# Patient Record
Sex: Female | Born: 1935 | Race: White | Hispanic: No | Marital: Married | Smoking: Current every day smoker
Health system: Southern US, Community
[De-identification: ages and names within clinical notes are randomized; demographics above are authoritative.]

## PROBLEM LIST (undated history)

## (undated) DIAGNOSIS — R195 Other fecal abnormalities: Secondary | ICD-10-CM

## (undated) DIAGNOSIS — K5790 Diverticulosis of intestine, part unspecified, without perforation or abscess without bleeding: Secondary | ICD-10-CM

## (undated) DIAGNOSIS — I509 Heart failure, unspecified: Secondary | ICD-10-CM

## (undated) DIAGNOSIS — E559 Vitamin D deficiency, unspecified: Secondary | ICD-10-CM

## (undated) DIAGNOSIS — K449 Diaphragmatic hernia without obstruction or gangrene: Secondary | ICD-10-CM

## (undated) DIAGNOSIS — H269 Unspecified cataract: Secondary | ICD-10-CM

## (undated) DIAGNOSIS — I340 Nonrheumatic mitral (valve) insufficiency: Secondary | ICD-10-CM

## (undated) DIAGNOSIS — R140 Abdominal distension (gaseous): Secondary | ICD-10-CM

## (undated) DIAGNOSIS — D649 Anemia, unspecified: Secondary | ICD-10-CM

## (undated) DIAGNOSIS — N281 Cyst of kidney, acquired: Secondary | ICD-10-CM

## (undated) DIAGNOSIS — E049 Nontoxic goiter, unspecified: Secondary | ICD-10-CM

## (undated) DIAGNOSIS — E119 Type 2 diabetes mellitus without complications: Secondary | ICD-10-CM

## (undated) DIAGNOSIS — M810 Age-related osteoporosis without current pathological fracture: Secondary | ICD-10-CM

## (undated) DIAGNOSIS — K625 Hemorrhage of anus and rectum: Secondary | ICD-10-CM

## (undated) DIAGNOSIS — E785 Hyperlipidemia, unspecified: Secondary | ICD-10-CM

## (undated) DIAGNOSIS — R197 Diarrhea, unspecified: Secondary | ICD-10-CM

## (undated) DIAGNOSIS — J449 Chronic obstructive pulmonary disease, unspecified: Secondary | ICD-10-CM

## (undated) DIAGNOSIS — M1990 Unspecified osteoarthritis, unspecified site: Secondary | ICD-10-CM

## (undated) DIAGNOSIS — N816 Rectocele: Secondary | ICD-10-CM

## (undated) DIAGNOSIS — D499 Neoplasm of unspecified behavior of unspecified site: Secondary | ICD-10-CM

## (undated) HISTORY — DX: Unspecified cataract: H26.9

## (undated) HISTORY — DX: Diverticulosis of intestine, part unspecified, without perforation or abscess without bleeding: K57.90

## (undated) HISTORY — DX: Cyst of kidney, acquired: N28.1

## (undated) HISTORY — DX: Rectocele: N81.6

## (undated) HISTORY — DX: Chronic obstructive pulmonary disease, unspecified: J44.9

## (undated) HISTORY — PX: COLON SURGERY: SHX602

## (undated) HISTORY — DX: Hyperlipidemia, unspecified: E78.5

## (undated) HISTORY — DX: Type 2 diabetes mellitus without complications: E11.9

## (undated) HISTORY — DX: Anemia, unspecified: D64.9

## (undated) HISTORY — DX: Age-related osteoporosis without current pathological fracture: M81.0

## (undated) HISTORY — DX: Nontoxic goiter, unspecified: E04.9

## (undated) HISTORY — DX: Nonrheumatic mitral (valve) insufficiency: I34.0

## (undated) HISTORY — PX: HEMORRHOID SURGERY: SHX153

## (undated) HISTORY — PX: NASAL SEPTUM SURGERY: SHX37

## (undated) HISTORY — DX: Heart failure, unspecified: I50.9

## (undated) HISTORY — DX: Other fecal abnormalities: R19.5

## (undated) HISTORY — PX: ABDOMINAL HYSTERECTOMY: SHX81

## (undated) HISTORY — DX: Abdominal distension (gaseous): R14.0

## (undated) HISTORY — DX: Vitamin D deficiency, unspecified: E55.9

## (undated) HISTORY — DX: Neoplasm of unspecified behavior of unspecified site: D49.9

## (undated) HISTORY — DX: Unspecified osteoarthritis, unspecified site: M19.90

## (undated) HISTORY — DX: Diaphragmatic hernia without obstruction or gangrene: K44.9

## (undated) HISTORY — DX: Hemorrhage of anus and rectum: K62.5

## (undated) HISTORY — PX: EYE SURGERY: SHX253

## (undated) HISTORY — DX: Diarrhea, unspecified: R19.7

---

## 1983-03-20 HISTORY — PX: SQUAMOUS CELL CARCINOMA EXCISION: SHX2433

## 1998-04-04 ENCOUNTER — Other Ambulatory Visit: Admission: RE | Admit: 1998-04-04 | Discharge: 1998-04-04 | Payer: Self-pay | Admitting: Otolaryngology

## 1999-11-24 ENCOUNTER — Ambulatory Visit (HOSPITAL_COMMUNITY): Admission: RE | Admit: 1999-11-24 | Discharge: 1999-11-24 | Payer: Self-pay | Admitting: Family Medicine

## 1999-11-24 ENCOUNTER — Encounter: Payer: Self-pay | Admitting: Family Medicine

## 2000-03-15 ENCOUNTER — Ambulatory Visit (HOSPITAL_COMMUNITY): Admission: RE | Admit: 2000-03-15 | Discharge: 2000-03-15 | Payer: Self-pay | Admitting: Surgery

## 2004-04-07 ENCOUNTER — Inpatient Hospital Stay (HOSPITAL_COMMUNITY): Admission: RE | Admit: 2004-04-07 | Discharge: 2004-04-12 | Payer: Self-pay | Admitting: Gastroenterology

## 2004-04-07 ENCOUNTER — Ambulatory Visit: Payer: Self-pay | Admitting: Gastroenterology

## 2004-04-07 ENCOUNTER — Ambulatory Visit: Payer: Self-pay | Admitting: Cardiology

## 2004-04-07 ENCOUNTER — Encounter: Payer: Self-pay | Admitting: Cardiology

## 2004-04-07 ENCOUNTER — Ambulatory Visit: Payer: Self-pay | Admitting: Internal Medicine

## 2004-04-27 ENCOUNTER — Ambulatory Visit: Payer: Self-pay | Admitting: Gastroenterology

## 2004-05-04 ENCOUNTER — Ambulatory Visit: Payer: Self-pay | Admitting: Gastroenterology

## 2004-05-10 ENCOUNTER — Ambulatory Visit: Payer: Self-pay | Admitting: Cardiology

## 2004-07-18 ENCOUNTER — Encounter (INDEPENDENT_AMBULATORY_CARE_PROVIDER_SITE_OTHER): Payer: Self-pay | Admitting: Specialist

## 2004-07-18 ENCOUNTER — Ambulatory Visit (HOSPITAL_COMMUNITY): Admission: RE | Admit: 2004-07-18 | Discharge: 2004-07-18 | Payer: Self-pay | Admitting: Surgery

## 2004-08-02 ENCOUNTER — Ambulatory Visit: Payer: Self-pay | Admitting: Cardiology

## 2004-08-28 ENCOUNTER — Ambulatory Visit: Payer: Self-pay

## 2004-12-01 ENCOUNTER — Ambulatory Visit (HOSPITAL_COMMUNITY): Admission: RE | Admit: 2004-12-01 | Discharge: 2004-12-01 | Payer: Self-pay | Admitting: Family Medicine

## 2005-02-14 ENCOUNTER — Ambulatory Visit: Payer: Self-pay | Admitting: Cardiology

## 2005-07-18 ENCOUNTER — Ambulatory Visit: Payer: Self-pay

## 2005-07-18 ENCOUNTER — Encounter: Payer: Self-pay | Admitting: Internal Medicine

## 2005-07-18 ENCOUNTER — Ambulatory Visit: Payer: Self-pay | Admitting: Cardiology

## 2006-02-25 ENCOUNTER — Ambulatory Visit: Payer: Self-pay | Admitting: Cardiology

## 2007-02-26 ENCOUNTER — Ambulatory Visit: Payer: Self-pay

## 2007-02-26 ENCOUNTER — Encounter: Payer: Self-pay | Admitting: Cardiology

## 2007-02-26 ENCOUNTER — Ambulatory Visit: Payer: Self-pay | Admitting: Cardiology

## 2008-07-27 ENCOUNTER — Ambulatory Visit (HOSPITAL_COMMUNITY): Admission: RE | Admit: 2008-07-27 | Discharge: 2008-07-27 | Payer: Self-pay | Admitting: Surgery

## 2008-08-03 ENCOUNTER — Encounter: Payer: Self-pay | Admitting: Cardiology

## 2008-11-11 ENCOUNTER — Encounter: Payer: Self-pay | Admitting: Cardiology

## 2008-12-23 DIAGNOSIS — C211 Malignant neoplasm of anal canal: Secondary | ICD-10-CM | POA: Insufficient documentation

## 2008-12-23 DIAGNOSIS — J449 Chronic obstructive pulmonary disease, unspecified: Secondary | ICD-10-CM | POA: Insufficient documentation

## 2008-12-23 DIAGNOSIS — N281 Cyst of kidney, acquired: Secondary | ICD-10-CM | POA: Insufficient documentation

## 2008-12-23 DIAGNOSIS — K5730 Diverticulosis of large intestine without perforation or abscess without bleeding: Secondary | ICD-10-CM | POA: Insufficient documentation

## 2008-12-23 DIAGNOSIS — I509 Heart failure, unspecified: Secondary | ICD-10-CM | POA: Insufficient documentation

## 2008-12-23 DIAGNOSIS — I428 Other cardiomyopathies: Secondary | ICD-10-CM | POA: Insufficient documentation

## 2008-12-23 DIAGNOSIS — E785 Hyperlipidemia, unspecified: Secondary | ICD-10-CM | POA: Insufficient documentation

## 2008-12-23 DIAGNOSIS — D509 Iron deficiency anemia, unspecified: Secondary | ICD-10-CM | POA: Insufficient documentation

## 2008-12-23 DIAGNOSIS — K449 Diaphragmatic hernia without obstruction or gangrene: Secondary | ICD-10-CM | POA: Insufficient documentation

## 2008-12-24 ENCOUNTER — Ambulatory Visit: Payer: Self-pay | Admitting: Gastroenterology

## 2008-12-24 DIAGNOSIS — K921 Melena: Secondary | ICD-10-CM | POA: Insufficient documentation

## 2008-12-24 DIAGNOSIS — K625 Hemorrhage of anus and rectum: Secondary | ICD-10-CM | POA: Insufficient documentation

## 2008-12-24 DIAGNOSIS — R141 Gas pain: Secondary | ICD-10-CM | POA: Insufficient documentation

## 2008-12-24 DIAGNOSIS — R197 Diarrhea, unspecified: Secondary | ICD-10-CM | POA: Insufficient documentation

## 2008-12-24 DIAGNOSIS — K649 Unspecified hemorrhoids: Secondary | ICD-10-CM | POA: Insufficient documentation

## 2008-12-24 DIAGNOSIS — R142 Eructation: Secondary | ICD-10-CM

## 2008-12-24 DIAGNOSIS — M129 Arthropathy, unspecified: Secondary | ICD-10-CM | POA: Insufficient documentation

## 2008-12-24 DIAGNOSIS — D049 Carcinoma in situ of skin, unspecified: Secondary | ICD-10-CM | POA: Insufficient documentation

## 2008-12-24 DIAGNOSIS — R143 Flatulence: Secondary | ICD-10-CM

## 2008-12-24 DIAGNOSIS — E039 Hypothyroidism, unspecified: Secondary | ICD-10-CM | POA: Insufficient documentation

## 2008-12-27 LAB — CONVERTED CEMR LAB
Basophils Absolute: 0.1 10*3/uL (ref 0.0–0.1)
Basophils Relative: 0.7 % (ref 0.0–3.0)
Eosinophils Absolute: 0.3 10*3/uL (ref 0.0–0.7)
Ferritin: 4 ng/mL — ABNORMAL LOW (ref 10.0–291.0)
Folate: 4.3 ng/mL
Hemoglobin: 10 g/dL — ABNORMAL LOW (ref 12.0–15.0)
Iron: 16 ug/dL — ABNORMAL LOW (ref 42–145)
Lymphs Abs: 2.5 10*3/uL (ref 0.7–4.0)
MCHC: 31.3 g/dL (ref 30.0–36.0)
MCV: 65.5 fL — ABNORMAL LOW (ref 78.0–100.0)
Neutrophils Relative %: 63.5 % (ref 43.0–77.0)
Platelets: 232 10*3/uL (ref 150.0–400.0)
RDW: 19.9 % — ABNORMAL HIGH (ref 11.5–14.6)
Transferrin: 305.1 mg/dL (ref 212.0–360.0)
Vitamin B-12: 176 pg/mL — ABNORMAL LOW (ref 211–911)

## 2008-12-30 ENCOUNTER — Encounter: Payer: Self-pay | Admitting: Gastroenterology

## 2008-12-30 ENCOUNTER — Ambulatory Visit: Payer: Self-pay | Admitting: Internal Medicine

## 2009-02-24 ENCOUNTER — Encounter: Payer: Self-pay | Admitting: Cardiology

## 2009-05-17 ENCOUNTER — Encounter: Payer: Self-pay | Admitting: Gastroenterology

## 2010-07-17 ENCOUNTER — Telehealth: Payer: Self-pay | Admitting: Gastroenterology

## 2010-07-17 NOTE — Telephone Encounter (Signed)
Appt given for pt to see Mike Gip PA 07/18/10@9am . Carlon to notify pt of appt date and time.

## 2010-07-18 ENCOUNTER — Other Ambulatory Visit (INDEPENDENT_AMBULATORY_CARE_PROVIDER_SITE_OTHER): Payer: Medicare Other

## 2010-07-18 ENCOUNTER — Ambulatory Visit (INDEPENDENT_AMBULATORY_CARE_PROVIDER_SITE_OTHER): Payer: Medicare Other | Admitting: Physician Assistant

## 2010-07-18 ENCOUNTER — Encounter: Payer: Self-pay | Admitting: Physician Assistant

## 2010-07-18 VITALS — BP 106/58 | HR 74 | Ht 67.5 in | Wt 170.0 lb

## 2010-07-18 DIAGNOSIS — K921 Melena: Secondary | ICD-10-CM

## 2010-07-18 DIAGNOSIS — D509 Iron deficiency anemia, unspecified: Secondary | ICD-10-CM

## 2010-07-18 DIAGNOSIS — Z85048 Personal history of other malignant neoplasm of rectum, rectosigmoid junction, and anus: Secondary | ICD-10-CM

## 2010-07-18 LAB — CBC WITH DIFFERENTIAL/PLATELET
Basophils Relative: 0.5 % (ref 0.0–3.0)
Lymphocytes Relative: 27.7 % (ref 12.0–46.0)
Lymphs Abs: 2.6 10*3/uL (ref 0.7–4.0)
MCHC: 32.7 g/dL (ref 30.0–36.0)
MCV: 78.8 fl (ref 78.0–100.0)
Monocytes Absolute: 0.6 10*3/uL (ref 0.1–1.0)
Monocytes Relative: 6.2 % (ref 3.0–12.0)
Platelets: 198 10*3/uL (ref 150.0–400.0)
RBC: 4.76 Mil/uL (ref 3.87–5.11)
WBC: 9.5 10*3/uL (ref 4.5–10.5)

## 2010-07-18 LAB — IRON AND TIBC
%SAT: 9 % — ABNORMAL LOW (ref 20–55)
Iron: 36 ug/dL — ABNORMAL LOW (ref 42–145)
TIBC: 401 ug/dL (ref 250–470)

## 2010-07-18 MED ORDER — PEG-KCL-NACL-NASULF-NA ASC-C 100 G PO SOLR: 1.0000 | Freq: Once | ORAL | Status: AC

## 2010-07-18 NOTE — Progress Notes (Signed)
Agree with assessment and plans 

## 2010-07-18 NOTE — Patient Instructions (Addendum)
We have scheduled the colonoscopy/Endoscopy with Dr.Patterson. Directions and brochure provided. We sent the colonoscopy prep to your pharmacy, [facility]. Stay off the aspirin. .Colonoscopy A colonoscopy is an exam to evaluate your entire colon. In this exam, your colon is cleansed. A long fiberoptic tube is inserted through your rectum and into your colon. The fiberoptic scope (endoscope) is a long bundle of enclosed and very flexible fibers. These fibers transmit light to the area examined and send images from that area to your caregiver. Discomfort is usually minimal. You may be given a drug to help you sleep (sedative) during or prior to the procedure. This exam helps to detect lumps (tumors), polyps, inflammation, and areas of bleeding. Your caregiver may also take a small piece of tissue (biopsy) that will be examined under a microscope. BEFORE THE PROCEDURE  A clear liquid diet may be required for 2 days before the exam.   Liquid injections (enemas) or laxatives may be required.   A large amount of electrolyte solution may be given to you to drink over a short period of time. This solution is used to clean out your colon.   Check in at the admissions desk to fill out necessary forms if not preregistered. There will be consent forms to sign prior to the procedure. If accompanied by friends or family, there is a waiting area for them while you are having your procedure.  LET YOUR CAREGIVER KNOW ABOUT:  Allergies to food or medicine.  Medicines taken, including vitamins, herbs, eyedrops, over-the-counter medicines, and creams.   Use of steroids (by mouth or creams).   Previous problems with anesthetics or numbing medicines.   History of bleeding problems or blood clots.  Previous surgery.   Other health problems, including diabetes and kidney problems.   Possibility of pregnancy, if this applies.   AFTER THE PROCEDURE  If you received a sedative and/or pain medicine, you  will need to arrange for someone to drive you home.   Occasionally, there is a little blood passed with the first bowel movement. DO NOT be concerned.  HOME CARE INSTRUCTIONS  It is not unusual to pass moderate amounts of gas and experience mild abdominal cramping following the procedure. This is due to air being used to inflate your colon during the exam. Walking or a warm pack on your belly (abdomen) may help.   You may resume all normal meals and activities after sedatives and medicines have worn off.   Only take over-the-counter or prescription medicines for pain, discomfort, or fever as directed by your caregiver. DO NOT use aspirin or blood thinners if a biopsy was taken. Consult your caregiver for medicine usage if biopsies were taken.  FINDING OUT THE RESULTS OF YOUR TEST Not all test results are available during your visit. If your test results are not back during the visit, make an appointment with your caregiver to find out the results. Do not assume everything is normal if you have not heard from your caregiver or the medical facility. It is important for you to follow up on all of your test results. SEEK IMMEDIATE MEDICAL CARE IF:    You pass large blood clots or fill a toilet with blood following the procedure. This may also occur 10 to 14 days following the procedure. This is more likely if a biopsy was taken.   You develop abdominal pain that keeps getting worse and cannot be relieved with medicine.  Document Released: 03/02/2000 Document Re-Released: 05/30/2009 ExitCare Patient  Information 2011 [place], [place].

## 2010-07-18 NOTE — Progress Notes (Signed)
Subjective:    [name], female    DOB: Nov 22, 1931, 77 y.o.   MRN: [id number]  HPI Katie Wilkins is a pleasant 77 year old white female known to Dr. Jarold Motto who comes in today accompanied by her daughter. She is referred by Dr. Vernon Prey for evaluation of a recurrent microcytic anemia. She had recent physical with her gynecologist Dr. Elana Alm and was also noted to be Hemoccult positive on rectal exam. Patient herself has not noted any melena or hematochezia. 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 Her last colonoscopy was done in June of 2010 per Dr. Ezzard Standing and was remarkable for diverticulosis and 3 lipomatous lesions in the right colon which apparently had been previously biopsied and were benign. She has not had endoscopy in several years. She Did Have a Capsule Endoscopy Done in 2010 As Well Which Was Unremarkable. The patient states that about 4 months ago she was told that her hemoglobin was normal and with recent labs on 07/13/2010 WBC was 6 hemoglobin 7.9 hematocrit 23.8 MCV of 80 platelets 1:15 electrolytes within normal limits albumin 4.2 hepatic panel normal.  Patient currently has no GI complaints, specifically no heartburn indigestion abdominal pain cramping nausea or change in her bowel habits. Her appetite has been good and her weight has been stable. She is not using any NSAIDs, she had been on a baby aspirin one daily prophylactically and this has been stopped. She is somewhat frustrated by the recurrent nature of her anemia, and lack of findings in the past. She does admit to feeling fatigued over the past month. She does have COPD and denies any ongoing problems with dyspnea.    Review of Systems  Constitutional: Positive for fatigue.  HENT: Negative.   Respiratory: Negative.   Cardiovascular: Negative.   Gastrointestinal: Negative.     Genitourinary: Negative.   Musculoskeletal: Negative.   Skin: Negative.   Neurological: Negative.   Hematological: Negative.   Psychiatric/Behavioral: Negative.        Objective:   Physical Exam Well-developed elderly white female in no acute distress, pleasant, alert and oriented x3, somewhat chronically ill-appearing  HEENT; ; normocephalic EOMI PERRLA ,sclera anicteric, neck; supple no JVD  Cardiovascular; regular rate and rhythm with S1-S2 occasional ectopic  Pulmonary; scattered rhonchi and wheeze bilaterally  Abdomen; soft nontender nondistended bowel sounds active no palpable mass or hepatosplenomegaly, Recta;l not repeated recently Hemoccult-positive  Extremities; no clubbing cyanosis or edema skin warm and dry   Psych; mood and affect normal and appropriate.        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 Intestinal AVMs have been suspected in the past but not documented. Now with recurrent microcytic anemia, again Hemoccult positive.  Plan; Repeat CBC today and if hemoglobin drifts below 7.5 she will need blood transfusions Check iron studies today and treat as indicated Discussed repeat endoscopic evaluation with the patient and her daughter, and they are agreeable to proceed with upper endoscopy and colonoscopy with Dr. Jarold Motto. She will be scheduled with propofol, given history of COPD, and poor response to conscious sedation in the past.  #2 Diverticulosis  #3 Cardiomyopathy and history of congestive heart failure, she does not require diuretics.  #4 COPD and continued  tobacco abuse  #5 Right colon lipomas identified at prior colonoscopy per Dr. Ezzard Standing 2010.

## 2010-07-19 ENCOUNTER — Telehealth: Payer: Self-pay | Admitting: *Deleted

## 2010-07-19 ENCOUNTER — Telehealth: Payer: Self-pay | Admitting: Physician Assistant

## 2010-07-19 DIAGNOSIS — D500 Iron deficiency anemia secondary to blood loss (chronic): Secondary | ICD-10-CM

## 2010-07-19 NOTE — Telephone Encounter (Signed)
Spoke to pt's daughter this Am about pt having repeat CBC done at [facility] today when she had her bone density test done.  Then Amy sent me a note saying the pt's is iron deficient and needs to take Integra iron supplement for 3 months.  I am to give her samples.  I left a message on the pt's daughters cell ( I think her name is Bjorn Loser) to please call me.

## 2010-07-19 NOTE — Telephone Encounter (Signed)
Message copied by Lowry Ram on Wed Jul 19, 2010  2:29 PM ------      Message from: [place], [place]      Created: Wed Jul 19, 2010  9:05 AM       PLEASE CALL PT. SHE IS IRON DEFICIENT -SHE SHOULD START INTEGRA PLUS ONE DAILY X 3 MONTHS THEN HAVE REPEAT IRON STUDIES. WE CAN GIVE HER SAMPLES. ALOS HER CBC DOES NOT MAKE SENSE. LABS FROM 4/26 FROM [facility] SHOWED A HGB OF 7.9!   ONE OF THESE SETS OF LABS IS WRONG! WE NEED TO REPEAT A CBC TODAY.

## 2010-07-19 NOTE — Telephone Encounter (Signed)
Spoke to daughter, Bjorn Loser and advised her there is a discrepancy with the HGB.  In their office on 4/26 it was 7.9. In our office yesterday it was 12.5.  Amy wants them repeated.  The daughter asked if she could have them done at Dr. Kathi Der office because she is there now for a bone density test.  Amy said that was fine. I faxed them the order today for the CBC w/diff. I spoke to [place] at that office and asked they fax the results back to [facility] asap  because the pt may need a blood transfusion.

## 2010-07-20 NOTE — Telephone Encounter (Signed)
Left another message on [phone] for the pt and her daughter, Bjorn Loser.  Advised again that I do have samples of Integra Plus Iron supplement at the front desk for the patient.  I put a month and 1/2 of samples and will do a prescription for the rest.  I asked them, to call me to let me know they got my message.

## 2010-07-21 ENCOUNTER — Telehealth: Payer: Self-pay | Admitting: *Deleted

## 2010-07-21 NOTE — Telephone Encounter (Signed)
Originally spoke pt to inform her of CBC results.  Anemia better.HGB is 9.4.  Pt said since she is not anemic she wants to cancel the colonoscopy and was adamit about it.  I tried to make a follow up appointment with Dr Jarold Motto in 6 weeks per Amy.  The pt told Me to make it with her daughter.  Bjorn Loser called me and I did make it on 08-22-2010 @ 10:30 AM.  Bjorn Loser is also coming on Tues the 8th to pick up the iron supplement samples for her mother.

## 2010-07-31 ENCOUNTER — Encounter: Payer: Self-pay | Admitting: Physician Assistant

## 2010-08-01 NOTE — Op Note (Signed)
[name], Katie Wilkins              ACCOUNT NO.:  [id number]   MEDICAL RECORD NO.:  [id number]          PATIENT TYPE:  AMB   LOCATION:  ENDO                         FACILITY:  [facility]   PHYSICIAN:  Sandria Bales. Ezzard Standing, M.D.  DATE OF BIRTH:  03-27-1931   DATE OF PROCEDURE:  07/27/2008  DATE OF DISCHARGE:                               OPERATIVE REPORT   Date of Surgery - 27 Jul 2008   PREOPERATIVE DIAGNOSIS:  History of guaiac positive stools, history of  Bowen's disease of anus.   POSTOPERATIVE DIAGNOSIS:  Three submucosal lipomatous masses of right  colon, minimal sigmoid colon diverticular disease.  No mucosal lesion.   OPERATION PERFORMED:  Flexible colonoscopy.   SURGEON:  Sandria Bales. Ezzard Standing, M.D.   ANESTHESIA:  75 mg of fentanyl, 5 mg of Versed.   COMPLICATIONS:  None.   INDICATIONS FOR PROCEDURE:  [name] is a 77 year old white female  who has had a prior history of Bowen's carcinoma of the anus excised in  1986.  She has had multiple prior colonoscopies with her most recent  performed in May of 2006.  So she is approximately 4 years from her last  colonoscopy.  She has noticed recently a change in her bowel habits and  she had guaiac positive stools and we discussed with her about repeating  the colonoscopy.  The indications and complications of colonoscopy were  explained to the patient.  The risk of colonscopy include perforation  and bleeding.   She completed a Half Lytely bowel prep at home the night before the  procedure and presented to the endoscopy suite where she was monitored  with pulse oximetry, an EKG, a blood pressure cuff and had nasal O2  during the case.   DESCRIPTION OF PROCEDURE:  A Pentax flexible colonoscope was passed  toward the cecum.  She always has trouble with pain or discomfort as the  scope is passed through her sigmoid colon.  I was able to get around to  her right colon and cecum.  I visualized her cecum, which was  unremarkable.  She has  submucsal lipomatous masses of her mid right  colon.  There appeared to be 3 lipomatous masses.  They were all  submucosal.  I had biopsied them previously and they were benign.  I did  not rebiopsy at this time.   Her transverse colon and left colon were unremarkable.  She does have  scattered sigmoid colon diverticula but this is fairly minimal disease.  The scope was then withdrawn into his rectum where it was retroflexed.  There is no obvious rectal mass or lesion.   Though the patient was uncomfortable during the procedure, she tolerated  it well.  [name] is soon to be 77 years old and her next  colonoscopy should be in 10 years depending on her health status.   I spoke to the daughter and the patient at the end of the case.  She is  to call for any problems.      Sandria Bales. Ezzard Standing, M.D.  Electronically Signed     DHN/MEDQ  D:  07/27/2008  T:  07/27/2008  Job:  [id number]   cc:   Ernestina Penna, M.D.  Fax: [phone]   Bruce R. Juanda Chance, MD, [facility]  [address]  [place], [place] [address]

## 2010-08-01 NOTE — Assessment & Plan Note (Signed)
[facility]                            CARDIOLOGY OFFICE NOTE   NAME:Wilkins, Katie Wilkins                     MRN:          [id number]  DATE:02/26/2007                            DOB:          12/08/1931    PRIMARY CARE PHYSICIAN:  Katie Wilkins, M.D.   CLINICAL COURSE:  Katie Wilkins is 77 years old and returns for followup  management of her cardiomyopathy. She was hospitalized in January 2006  with profound anemia and congestive heart failure. At that time her  ejection fraction was about 30%. She underwent catheterization and was  found to have minimal plaque and no evidence of any obstructive coronary  disease. She had a workup for anemia and no cause was found. She  subsequently improved. We did an echocardiogram just prior to that visit  and her LV function was normal. She says she has been feeling well, has  had no chest pain or palpitations. She is a chronic smoker and has COPD  and she does have shortness of breath with exertion.   PAST MEDICAL HISTORY:  Significant for her COPD and continued cigarette  use. She also has had squamous cell cancer of the rectum which was  resected in 1985. She has hyperlipidemia. She came in with a lipid  profile done by Dr. Christell Wilkins which showed she was not at target for her HDL  and LDL and he had adjusted her lipids for her at that time.   CURRENT MEDICATIONS:  Aspirin, Prilosec, Advair, Levoxyl, lisinopril,  metoprolol, Lipitor, digoxin, hydrochlorothiazide.   PHYSICAL EXAMINATION:  VITAL SIGNS:  The blood pressure is 119/63 and  the pulse 83 and regular.  NECK:  There was no vein distention. Carotid pulses were full without  bruits.  CHEST:  Clear but with decreased breath sounds.  HEART:  Rhythm was regular. I heard no murmurs or gallops.  ABDOMEN:  Soft without organomegaly.  EXTREMITIES:  Peripheral pulses were full and there was no peripheral  edema.   An electrocardiogram showed ST-T changes in the  inferior and lateral  leads which have not changed from previous electrocardiograms.   1. Nonischemic cardiomyopathy of uncertain etiology with improvement      in ejection fraction from 30% to normal by recent echo.  2. Minimal amount of obstructive coronary artery disease.  3. Chronic obstructive pulmonary disease with continued tobacco use.  4. Iron deficiency anemia with negative gastrointestinal workup.  5. Hyperlipidemia.   RECOMMENDATIONS:  I think Katie Wilkins is doing quite well from her heart  and her LV function has completely normalized. I think we can stop the  Lanoxin. I do not think she will need  to have scheduled followup here and I will be available to see her on a  p.r.n. basis if she has any recurrent difficulties. She plans to  followup with Dr. Christell Wilkins.     Katie Elvera Lennox Juanda Chance, MD, [facility]  Electronically Signed    BRB/MedQ  DD: 02/26/2007  DT: 02/27/2007  Job #: [id number]   cc:   Katie Wilkins, M.D.

## 2010-08-02 ENCOUNTER — Encounter: Payer: Medicare Other | Admitting: Gastroenterology

## 2010-08-04 ENCOUNTER — Encounter: Payer: Medicare Other | Admitting: Gastroenterology

## 2010-08-04 NOTE — Op Note (Signed)
NAMEIVELISE, Katie Wilkins              ACCOUNT NO.:  [id number]   MEDICAL RECORD NO.:  [id number]          PATIENT TYPE:  AMB   LOCATION:  ENDO                         FACILITY:  [facility]   PHYSICIAN:  Sandria Bales. Ezzard Standing, M.D.  DATE OF BIRTH:  [birth year]   DATE OF PROCEDURE:  07/18/2004  DATE OF DISCHARGE:                                 OPERATIVE REPORT   PREOPERATIVE DIAGNOSIS:  History of colonic polyps, history of Bowen's  disease of the anus, and history of mild anemia.   POSTOPERATIVE DIAGNOSIS:  Two lipomatous masses of right colon.  Minimal  diverticular disease.  No other obvious mucosal lesion.   PROCEDURE PERFORMED:  Flexible colonoscopy with biopsy of lipomatous mass,  right colon.   SURGEON:  Sandria Bales. Ezzard Standing, M.D.   ANESTHESIA:  70 mg of fentanyl, 5 mg of Versed.   COMPLICATIONS:  None.   INDICATIONS FOR PROCEDURE:  Katie Wilkins is a 77 year old white female who  has had a prior history of Bowen's carcinoma of her anus excised in 1986.  She has also had prior colonic polyps which have been biopsied in the past.  Her last colonoscopy was December of 2001.  She has had some mild anemia  evaluated earlier this year, so comes for both monitoring of colonic polyps,  history of a Bowen's disease of the anus, and the anemia.   The patient completed a Half-Lytely bowel prep the night before the  procedure.  She was in the endoscopy suite where she was monitored with  pulse oximetry, and EKG and had oxygen placed and had a pulse oximeter.   DESCRIPTION OF PROCEDURE:  The pediatric flexible Olympus colonoscope was  passed toward the cecum.  I had some trouble getting around the bend  at the  hepatic flexure but advanced into the ileocecal valve which I identified.  In the right colon, she had two lipomatous masses which were probably ach  about 2 cm in diameter.  These did not appear to be mucosal lesions but  submucosal lipomatous masses.  I did biopsy one of them just for  confirmation sake.   Her right colon, transverse colon and left colon were essentially otherwise  unremarkable of any mucosal lesion or mass.  She had a couple of scattered  sigmoid colon diverticula.  The scope within the rectum was then retroflexed  and there was no mucosal lesion or abnormality within the anus rectum.   The patient tolerated the procedure well.  The pathology on the lipomatous  masses is pending at the time of this dictation.  Her next recommended  colonoscopy is in five years unless she should have a change in bowel habits  or symptoms.      DHN/MEDQ  D:  07/18/2004  T:  07/18/2004  Job:  [id number]   cc:   Ernestina Penna, M.D.  [address] [place]  [place] [address]  Fax: [phone]   Charlies Constable, M.D. [facility]

## 2010-08-04 NOTE — Discharge Summary (Signed)
NAMEZEDA, GANGWER              ACCOUNT NO.:  [id number]   MEDICAL RECORD NO.:  [id number]          PATIENT TYPE:  INP   LOCATION:  3701                         FACILITY:  [facility]   PHYSICIAN:  Mallory Shirk, MD     DATE OF BIRTH:  1931-05-31   DATE OF ADMISSION:  04/07/2004  DATE OF DISCHARGE:                                 DISCHARGE SUMMARY   DISCHARGE DIAGNOSES:  1.  Congestive heart failure.  2.  Anemia.  3.  Hypothyroidism.  4.  Renal cyst.   DISCHARGE MEDICATIONS:  1.  Protonix 40 mg p.o. daily.  2.  Ferrous gluconate 300 mg p.o. b.i.d.  3.  Digoxin 0.125 mg p.o. daily.  4.  Aspirin 81 mg p.o. daily.  5.  Lasix 40 mg p.o. daily.  6.  Advair inhaler one puff b.i.d.  7.  Lisinopril 5 mg p.o. daily.  8.  Metoprolol 25 mg p.o. daily.   FOLLOW-UP APPOINTMENTS:  1.  Ernestina Penna, M.D., PCP, on April 21, 2004, at 10 a.m. at which      time patient will review all medications with Dr. Christell Constant.  2.  Charlies Constable, M.D., cardiology.  His office will call with an      appointment.  3.  Vania Rea. Jarold Motto, M.D., GI, on February 9 at 11 a.m.  4.  Ernestina Penna, M.D., in six months from now for a repeat CT scan of      the abdomen to evaluate renal cysts.  Dr. Kathi Der office will set up      this appointment on the visit of April 21, 2004.   HISTORY OF PRESENT ILLNESS:  Ms. Sliwa is a very pleasant 77 year old  Caucasian woman who was admitted by Dr. Jarold Motto of  GI on April 07, 2004, with chief complaint of weakness, fatigue, and newly diagnosed  anemia.  __________ initially consulted on her on April 07, 2004, and  patient was subsequently transferred to the __________ service on April 10, 2004.  The patient has a history of COPD secondary to long term smoking  and history of Bowen disease diagnosed about 20 years ago, followed by Dr.  Ezzard Standing from surgery status post excision of an anal lesion in 1985.  Colonoscopy in 2001 was unremarkable.  She also  has a history of  hypothyroidism and is status post hysterectomy.  The patient was in  otherwise good health and remained active being the primary caregiver for  her elderly mother.  The patient stated that, over the two weeks prior to  admission, she developed generalized fatigue and weakness with a cough which  was treated by antibiotics for bronchitis.  The patient denied any shortness  of breath or cough at the time of this admission.  The patient also did not  have any chest pain even though she did have some mild pain in the abdomen.  She also noticed that her appetite has been decreased in the 3-4 months  prior to admission, lost about 15 pounds which she attributed to the stress  of taking care of her mother.  The patient has not had any prior history of  anemia or GI bleeding.  No change in bowel habits.  No melena or  hematochezia.  The patient's hemoglobin and hematocrit in October 2005 was  12.4/38.5.  In general, on April 03, 2004, hemoglobin was 10.3, hematocrit  34.  The patient was referred to Dr. Jarold Motto for evaluation of anemia.  She was found to be hemoccult-negative on rectal exam.  However, she was  noted to have some JVD and some hepatomegaly and was admitted for further  evaluation.   MEDICATIONS ON ADMISSION:  1.  Levoxyl 0.122 mg p.o. daily.  2.  Protonix 40 mg p.o. b.i.d. over the one week period prior to admission.  3.  Advair 250/50 one puff b.i.d.  4.  Tylenol p.r.n.  5.  Was on Zocor which was stopped the week prior to admission.   ALLERGIES:  No known drug allergies.   PAST MEDICAL HISTORY:  1.  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  2.  Goiter, status post iodine treatment, now hypothyroid.  3.  Status post hysterectomy.  4.  History of tobacco abuse.   PHYSICAL EXAMINATION ON ADMISSION:  VITAL SIGNS:  Blood pressure 120/70,  pulse 70.  HEENT:  Normocephalic and atraumatic, extraocular muscles  intact, PERRL,  sclerae anicteric.  NECK:  Supple.  No LAD.  JVD above the jaw line.  CARDIOVASCULAR:  S1 plus S2, regular rate and rhythm.  LUNGS:  Clear to auscultation bilaterally.  ABDOMEN:  Soft, positive bowel sounds.  Liver margin 3 fingerbreadths below  the right costal margin.  She also has splenomegaly with spleen tip palpable  4 fingerbreadths below the left costal margin.  Epigastric tenderness noted.  Positive bowel sounds.  RECTAL:  Hemoccult negative.  EXTREMITIES:  2+ edema in the ankles.  NEUROLOGIC:  Nonfocal.   HOSPITAL COURSE:  The patient was admitted to a monitored bed.  Problem 1.  Congestive heart failure.  The patient was evaluated by Dr. Charlies Constable.  Echocardiogram on April 07, 2004, showed marked dilation of the left  ventricle with overall left ventricular function that is markedly decreased.  LVEF was estimated at 20-30% with diffuse left ventricular hypokinesis.  Moderate mitral valve regurgitation was also noted.  Left atrium was  moderately dilated.  Right ventricular systolic function was mildly reduced.  Peak pulmonary artery systolic pressure was also mildly increased.  The  patient underwent cardiac catheterization on April 10, 2004, which showed  nonobstructive coronary artery disease and severe nonischemic cardiomyopathy  with an ejection fraction estimated at 25%.  At this time, it was determined  that medical management would be the best option for this patient.  She was  started on digoxin, ACE inhibitor, and Coreg.  She will be discharged on  lisinopril, metoprolol, and Digoxin.  She will be followed up by Dr. Juanda Chance  in his office.   Problem 2. Anemia.  The patient's H&H on admission 9.3/30.1.  She was seen  by Dr. Jarold Motto.  CT scan of the abdomen on April 07, 2004, showed  borderline hepatomegaly but diffuse heterogeneity of the hepatic echo  texture and periportal edema.  Multiple right low density renal lesions were seen.  CT of  the pelvis showed no acute findings.  Renal lesions were  followed by abdominal ultrasound which showed the lesions to be just greater  than 1 cm.  The recommendation was to follow up  with a CT scan in six  months.  The patient was scheduled for a colonoscopy.  However, because of  her cardiac issues, this has been deferred to outpatient setting.  The  patient will be seen by Dr. Jarold Motto on February 9 for scheduling  colonoscopy.  The patient received a blood transfusion on April 09, 2004.  Her H&H has been stable after that transfusion with her hemoglobin and  hematocrit on the day of discharge being 11.8/38.9.  Also noticed was a  transient thrombocytopenia, platelets noted to be 94,000 on April 09, 2004.  On day of discharge, patient's platelets were 160,000.   Problem 3. Hypothyroidism.  The patient's Synthroid was continued.   Problem 4. Renal cysts.  As noted above, CT scan showed cysts in both  kidneys which were further evaluated by ultrasound.  Ultrasound evaluation  showed small complex cystic lesions in the lower pole of both kidneys  measuring just greater than 1 cm.  Recommend is to follow up with the CT  scan in six months.  Dr. Vernon Prey, primary care physician, arranged for  this CT scan.   The patient was discharged in stable condition.  She indicated that she had  no prescription coverage.  Two sets of prescriptions have been written.  Social work will help in getting patient assistance with her prescriptions.  The patient was ambulatory without any difficulty.  She was advised to keep  her follow-up appointments, take her medications as prescribed, and return  to the emergency department immediately upon onset of chest pain, fever, or  any other symptoms that may need immediate medical attention.      GDK/MEDQ  D:  04/12/2004  T:  04/12/2004  Job:  [id number]   cc:   Ernestina Penna, M.D.  [address] [place]  [place] [address]  Fax: [phone]   Vania Rea.  Jarold Motto, M.D. [facility]   Charlies Constable, M.D. [facility]

## 2010-08-04 NOTE — Consult Note (Signed)
NAMELYDIANN, BONIFAS              ACCOUNT NO.:  [id number]   MEDICAL RECORD NO.:  [id number]          PATIENT TYPE:  INP   LOCATION:  3701                         FACILITY:  [facility]   PHYSICIAN:  Charlies Constable, M.D. [facility] DATE OF BIRTH:  June 19, 1931   DATE OF CONSULTATION:  04/07/2004  DATE OF DISCHARGE:                                   CONSULTATION   REFERRING PHYSICIAN:  Dr. Barry Dienes. [place].   CHIEF COMPLAINT:  Shortness of breath and weakness.   CLINICAL HISTORY:  Mrs. Schmelter is a 77 year old female from [place] who was  admitted through the office after being seen by Dr. Eloise Harman with profound  anemia and signs of congestive heart failure.  About 2 weeks ago, she  developed what she describes as bronchitis characterized by cough and  shortness of breath.  She also developed swelling in her lower extremities.  She also had chest pain which she associated with her bronchitis and which  was central and left-sided, but not related to exertion.  She was seen in  the office by Dr. Eloise Harman today and her hemoglobin had dropped to 9 from a  previous level I believe of 14 and she was short of breath, and he admitted  her for further evaluation.   She has had no prior history of known heart disease and has no history of  heart attacks or cardiac arrhythmia.   PAST HISTORY:  Her past history is negative for diabetes or hypertension.  She does have hyperlipidemia, but has been intolerant to several statin  medications.  She has also been a long smoker for many years and has a  history of chronic obstructive pulmonary disease and bronchitis.  She also  has a history of a goiter.   SOCIAL HISTORY/FAMILY HISTORY/REVIEW OF SYSTEMS:  She lives in [place] with  her husband and is a retired Retail buyer.  She smokes more than 50  pack years of cigarettes.   For details of family history, social history and review of systems, please  see Bjorn Loser Barrett's note.   PHYSICAL EXAMINATION:   NECK:  On examination, jugular venous pulsation is  visible up about 3 cm above the clavicle.  The carotids were full and there  were no bruits.  CHEST:  Chest had decreased breath sounds and some rales at the bases.  CARDIAC:  Cardiac rhythm was regular.  Heart sounds were normal.  There was  a 2/6 systolic ejection murmur at the left sternal edge radiating to the  base.  ABDOMEN:  The abdomen was soft.  The liver could be felt a fingerbreadth  below the right costal margin.  There were no pulsatile masses and the bowel  sounds were normal.  EXTREMITIES:  There was 2+ peripheral edema.  The pedal pulses were equal.  MUSCULOSKELETAL:  The musculoskeletal system showed no deformities.  SKIN:  Skin was warm and dry.  NEUROLOGICAL:  Examination showed no focal neurological signs.   Her BUN was 16 and creatinine 0.9.  Her hemoglobin was 9.3.  Her troponin  was 0.14 and her BNP was 2203.  Her CK/MB  were 24/2.6.  She was heme-  negative.   IMPRESSION:  1.  Congestive heart failure, predominantly right-sided, possibly secondary      to anemia and cor pulmonale.  2.  Chronic obstructive pulmonary disease with long-term cigarette use.  3.  Iron deficiency anemia.  4.  History of hyperlipidemia.   RECOMMENDATIONS:  We will plan to wait for the results of the echocardiogram  to help in deciding regarding further evaluation.  If the echocardiogram  shows any LV dysfunction, then I think she should be evaluated further  probably with angiography.  If her echocardiogram shows only right  ventricular enlargement and normal LV function, then we may attribute her  troponins to her congestive heart failure and consider a noninvasive  evaluation later for ischemia.  I would recommend gentle diuresis and  consider transfusing her to a hemoglobin of greater than 10.       BB/MEDQ  D:  04/07/2004  T:  04/08/2004  Job:  [id number]   cc:   Ernestina Penna, M.D.  [address] [place]  [place]  [address]  Fax: [phone]   Lina Sar, M.D. [facility]

## 2010-08-04 NOTE — Discharge Summary (Signed)
[name], [name]              ACCOUNT NO.:  [id number]   MEDICAL RECORD NO.:  [id number]          PATIENT TYPE:  INP   LOCATION:  3701                         FACILITY:  [facility]   PHYSICIAN:  Mallory Shirk, MD     DATE OF BIRTH:  1931/09/30   DATE OF ADMISSION:  04/07/2004  DATE OF DISCHARGE:  04/12/2004                                 DISCHARGE SUMMARY   ADDENDUM   Potassium chloride 20 mEq one tablet p.o. q.d. was added to her outpatient  medications.  Dr. Vernon Prey will check a CBC and basic metabolic profile on  April 21, 2004, in his office.  He will renew her prescription for  potassium as needed.      GDK/MEDQ  D:  04/12/2004  T:  04/12/2004  Job:  [id number]

## 2010-08-04 NOTE — H&P (Signed)
NAMEARBIE, REISZ              ACCOUNT NO.:  [id number]   MEDICAL RECORD NO.:  [id number]          PATIENT TYPE:  INP   LOCATION:  3701                         FACILITY:  [facility]   PHYSICIAN:  Vania Rea. Jarold Motto, M.D. [facility] OF BIRTH:  [birth year]   DATE OF ADMISSION:  04/07/2004  DATE OF DISCHARGE:                                HISTORY & PHYSICAL   CHIEF COMPLAINT:  Weakness and fatigue with newly diagnosed anemia.   HISTORY OF PRESENT ILLNESS:  Katie Wilkins is a pleasant, 77 year old, white  female, a primary patient of Dr. Vernon Prey, who is new to [facility] today  on the day of admission.  She does have a history of COPD secondary to long-  term smoking and history of Bowen disease which was initially diagnosed  approximately 20 years ago.  She has been followed by Dr. Ezzard Standing from a  surgical standpoint and had this anal lesion excised in 1985.  Her last  colonoscopy was done in 2001, which was unremarkable.  She also has a  history of hypothyroidism and is status post hysterectomy.  She has  otherwise been in relatively good health and has remained quite active being  the primary caregiver for her elderly mother.  The patient relates that over  the past couple of weeks, she has developed a generalized fatigue and  weakness and did have a cough which was treated with antibiotics and  bronchitics, however, she did not have any improvement after treatment with  the antibiotics and continues to just generally feel fatigued and poorly.  She denies any shortness of breath or cough currently.  She has not had any  chest pain or anginal symptoms.  She says she has had some soreness in her  upper abdomen.  In retrospect, she also says that her appetite has been off  over the past 3-4 months and she had lost 15 pounds, although she and her  family had attributed this to stress with taking care of her elderly mother.  The patient had not had any previous history of anemia or GI bleeding.   She  has not noted any changes in her bowel habits, no melena or hematochezia.  Lab work done per Dr. Roe Coombs Moore's office in October 2005, showed a  hemoglobin of 12.4, hematocrit 38.5, MCV 67.  Followup on April 03, 2004,  shows a hemoglobin of 10.3, hematocrit of 34 and MCV of 65 with platelet  count 111.  BUN was 18, creatinine 0.7, sodium 142 and potassium 3.7.  Liver  function studies normal.  Albumin was 3.7.  Serum iron was 16, TIBC 421 and  iron saturation of 4.  B12 was 564 and folate of 9.5.  Chest x-ray done on  January 18, showed cardiomegaly with no acute pulmonary disease and no  pulmonary edema.  CT of the sinuses was negative.  The patient was referred  to Dr. Jarold Motto today for evaluation of the anemia and found to be  hemoccult negative on rectal exam.  However, on further examination, she was  also noted to have an enlarged, somewhat tender liver and evidence  of  jugular venous distention consistent with right-sided congestive heart  failure.  It was felt that hospitalization was warranted for further  diagnostic evaluation and management.   CURRENT MEDICATIONS:  1.  Levoxyl 0.112 daily.  2.  Protonix 40 mg p.o. b.i.d. over the past 1 week.  3.  Advair 250/50 one spray b.i.d.  4.  Tylenol p.r.n.  5.  Zocor which was stopped last week.   ALLERGIES:  No known drug allergies.   PAST MEDICAL HISTORY:  1.  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  Has been followed by Dr. Ezzard Standing with last colonoscopy      in 2001, being negative.  2.  History of goiter, status post iodine treatment and is now hypothyroid.  3.  Status post hysterectomy with ovaries intact.  4.  Smoker.   SOCIAL HISTORY:  The patient is retired.  She is the primary caregiver for  her elderly mother.  She lives with her husband.  No ETOH.  She is a smoker  with one pack per day.   FAMILY HISTORY:  Pertinent for diabetes mellitus on the maternal side of  the  family.  There is no history of GI disease, anemia or cancers that they are  aware of.   REVIEW OF SYSTEMS:  CONSTITUTIONAL:  Pertinent for weakness and fatigue over  the past couple of weeks.  Decreased appetite and weight loss of 15 pounds  over the past 3-4 months.  EXTREMITIES:  She has also noted swelling in her  lower extremities over the past couple of weeks.  GASTROINTESTINAL:  As  above.   PHYSICAL EXAMINATION:  GENERAL:  Well-developed, white female, pale.  Complexion is somewhat sallow.  In no acute distress.  VITAL SIGNS:  Blood pressure 120/70, pulse 70s.  HEENT:  Atraumatic, normocephalic.  EOMI.  PERRLA.  Sclerae are anicteric.  NECK:  Supple without palpable nodes.  She does have JVD just above the jaw  line.  CARDIAC:  Regular rate and rhythm with S1, S2.  LUNGS:  Pulmonary clear to A&P.  ABDOMEN:  Soft.  She does have an enlarged liver palpable three  fingerbreadths below the right costal margin.  She also has splenomegaly  with spleen tips palpable four fingerbreadths below the left costal margin.  She has some mild epigastric tenderness.  No other palpable organomegaly or  masses.  No fluid wave noted.  Bowel sounds are active.  RECTAL:  Hemoccult negative per Dr. Jarold Motto earlier today.  EXTREMITIES:  2+ edema in the ankles.  Pulses are 2+ and equal.  NEUROLOGIC:  Grossly nonfocal.   IMPRESSION:  44.  A 77 year old, white female with new-onset of anemia with drop in      hemoglobin, iron deficient indices, hemoccult negative stool.  No      current evidence for gastrointestinal bleeding, rule out hemolytic      process or intraperitoneal blood loss.  2.  Hepatosplenomegaly, etiology not clear.  3.  Probable right-sided congestive heart failure.  4.  Hypothyroidism.  5.  Remote history of Bowen disease, anal cell/squamous cell which was      resected with serial colonoscopies negative.  6.  Smoker. 7.  Status post hysterectomy.  8.  Chronic obstructive  pulmonary disease.   PLAN:  1.  The patient is admitted to the service of Dr. Sheryn Bison.  Will      check baseline labs.  2.  Coombs test and haptoglobin, type and cross match sedimentation rate and      transfuse her for hemoglobin less than 8.  3.  Chest x-ray and CT scan of the abdomen and pelvis.  4.  We will ask [facility] internist to follow her from a medical standpoint      and manage any non-GI issues.  5.  Cover her with IV Protonix.  6.  Will give her one dose of IV Lasix now.  7.  She may eventually need endoscopy and colonoscopy, although will await      results of other studies.  8.  For details, please see the orders.       AE/MEDQ  D:  04/07/2004  T:  04/07/2004  Job:  [id number]   cc:   Ernestina Penna, M.D.  [address] [place]  [place] [address]  Fax: [phone]

## 2010-08-04 NOTE — Cardiovascular Report (Signed)
NAMEKEALY, LEWTER NO.:  [id number]   MEDICAL RECORD NO.:  [id number]          PATIENT TYPE:  INP   LOCATION:  3701                         FACILITY[facility]  [facility]   PHYSICIAN:  Charlies Constable, M.D. [facility] DATE OF BIRTH:  May 17, 1931   DATE OF PROCEDURE:  04/10/2004  DATE OF DISCHARGE:                              CARDIAC CATHETERIZATION   CLINICAL HISTORY:  Ms. Collantes is 77 years old and presented with a history  of two weeks of weakness, fatigue and edema, and shortness of breath.  She  was found to be profoundly anemic by Dr. Jarold Motto and admitted to the  hospital. She was felt to be in congestive heart failure possibly  predominant right-sided.  She also felt to have significant chronic  obstructive pulmonary disease and has a long history of smoking.  Her  echocardiogram showed severe left ventricular dysfunction with an ejection  fraction of 25-30%, and she was scheduled for evaluation and angiography.   PROCEDURE:  Right heart catheterization was performed percutaneously through  the right femoral artery using arterial sheath and 6 French preformed  coronary catheters.  A front wall arterial puncture was performed and  Omnipaque contrast was used.  Right heart catheterization was performed  percutaneously through the right femoral vein using a venous sheath and Swan-  Ganz thermodilution catheter.  Left heart catheterization was performed  percutaneously through the right femoral artery using an arterial sheath and  6 French preformed coronary catheters.  A front wall arterial puncture was  performed and Omnipaque contrast was used.  The patient tolerated the  procedure well and left the lab in satisfactory condition.   RESULTS:  Left main coronary artery:  The left main coronary artery was free  of significant disease.   Left anterior descending artery:  The left anterior descending artery gave  rise to three diagonal branches and two septal perforators.  These  arteries  are free of significant disease.   Circumflex artery:  The circumflex artery gave rise to a marginal branch,  and three posterolateral branches.  There was a 30% narrowing in the  proximal to mid vessel after the first marginal branch.  The rest of the  vessels were free of significant disease.   Right coronary artery:  The right coronary artery is a moderate size vessel  that gave rise to a conus branch, right ventricular branch, posterior  descending branch, and three posterolateral branches.  There was 30%  narrowing in the proximal vessel and irregularities in the mid vessel.   Left ventriculogram:  The left ventriculogram performed in the RAO  projection showed global hypokinesis with an estimated ejection fraction of  25%.   HEMODYNAMIC DATA:  The right heart pressure was 9 mean.  The pulmonary  artery pressure was 50/21 with a mean of 35.  The pulmonary wedge pressure  was 24 with V waves to 37.  The left ventricular pressure was 140/27.  The  aortic pressure was 140/50 with a mean of 93.  Cardiac output/cardiac index  was 3.7/2.0 liters/minute/meter squared by thermodilution and 3.0/1.6  liters/minute/meter squared by thick.  CONCLUSION:  1.  Nonobstructive coronary artery disease.  2.  Severe nonischemic cardiomyopathy with an ejection fraction estimated at      25%.   RECOMMENDATIONS:  The patient appears to have a nonischemic cardiomyopathy.  The etiology is not clear.  We will requestion the patient regarding  possible alcohol use.  Her QRS is only 90 milliseconds so she does not  appear to be a candidate for Biv pacer.  If her left ventricular dysfunction  persists, she may be a candidate for prophylactic implantable cardioverter  defibrillator.  In the meantime, we will plan continued diuresis and  continue medical therapy.  She probably can tolerate an endoscopy now, but  we might want to wait until she is better compensated to do the colon.       BB/MEDQ  D:  04/10/2004  T:  04/10/2004  Job:  [id number]   cc:   Ernestina Penna, M.D.  [address] [place]  [place] [address]  Fax: [phone]   Lina Sar, M.D. [facility]

## 2010-08-04 NOTE — Discharge Summary (Signed)
NAMESOLITA, MACADAM              ACCOUNT NO.:  [id number]   MEDICAL RECORD NO.:  [id number]          PATIENT TYPE:  INP   LOCATION:  3701                         FACILITY[facility]   PHYSICIAN:  Mallory Shirk, MD     DATE OF BIRTH:  16-Jan-1932   DATE OF ADMISSION:  04/07/2004  DATE OF DISCHARGE:                                 DISCHARGE SUMMARY   Audio too short to transcribe (less than 5 seconds)      GDK/MEDQ  D:  04/12/2004  T:  04/12/2004  Job:  [id number]

## 2010-08-04 NOTE — Op Note (Signed)
[place]  Patient:    Katie Wilkins, Katie Wilkins                     MRN: [id number] Proc. Date: 03/15/00 Adm. Date:  [id number] Attending:  Andre Lefort CC:         Vernon Prey, M.D., [facility]   Operative Report  DATE OF BIRTH:  1931/09/17  PREOPERATIVE DIAGNOSIS:  Prior history of colonic polyps and Bowens disease of anus.  PROCEDURE:  Flexible colonoscopy.  SURGEON:  Sandria Bales. Ezzard Standing, M.D.  ANESTHESIA:  Demerol 100 mg, 5 mg of Versed.  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  She has also had prior colonic polyps on prior colonoscopies.  Her last colonoscopy three years ago, she had benign adenomatous polyps found.  She now comes for colonoscopy.  DESCRIPTION OF PROCEDURE:  Patient completed drinking three-fourths of GoLYTELY the night before the procedure.  She was monitored with pulse oximetry, EKG and on O2.  A flexible Olympus colonoscope was passed around towards her cecum.  She became fairly combative during the procedure.  Again, was given a total of 100 mg of Demerol, 5 mg of Versed.  I was able to advance the scope around to the cecum, visualize the cecum well.  She has a fatty ileocecal valve, but she had no polyps or mucosal lesions of the cecum, right colon, transverse colon, left colon or sigmoid colon.  Actually she has maybe very minimal diverticular disease.  Scope was then withdrawn back in the rectum and retroflexed and saw no lesions of her rectum.  Her bowel prep was actually pretty good, except for some fluid along the colon.  She actually tolerated the procedure fairly well, except for combativeness.  With no polyps found this time, I would recommend follow-up colonoscopy in five years, unless she should have a change in symptoms or problems. DD:  03/15/00 TD:  03/15/00 Job: [id number]

## 2010-08-04 NOTE — Consult Note (Signed)
Katie Wilkins, Katie Wilkins              ACCOUNT NO.:  [id number]   MEDICAL RECORD NO.:  [id number]          PATIENT TYPE:  INP   LOCATION:  3701                         FACILITY:  [facility]   PHYSICIAN:  Katie Wilkins, D.O.    DATE OF BIRTH:  03/12/1932   DATE OF CONSULTATION:  DATE OF DISCHARGE:                                   CONSULTATION   REQUESTING PHYSICIAN:  Katie Wilkins, M.D.   PRIMARY CARE PHYSICIAN:  Katie Wilkins, M.D.   REASON FOR CONSULTATION:  Questionable congestive heart failure.   HISTORY OF PRESENT ILLNESS:  This is a 77 year old female with a  longstanding history of tobacco dependence and thyroid disease, who presents  for evaluation of microcytic anemia and weight loss.  She has previously had  colonoscopy and had been told she was due for a colonoscopy this year.  In  any event, she is known to be microcytic and iron deficient in the  outpatient setting.   With regards to the possibility of congestive heart failure and review of  notes sent over by the primary care physician's office, it was noted that  she has a known history of right-sided heart failure, however, I do not see  any echocardiogram or any cardiac evaluation in the records and she is not  aware of being evaluated by a cardiologist in the past.  In any event, her  EKG was reviewed, revealing sinus rhythm with some PVCs and possible  anterior infarction of an undetermined age.  In any event, she has been  having shortness of breath over the past three weeks, more so than usual.  She has chronic lower extremity edema, which she states has remained only  unchanged.  In any event, we are asked to evaluate her with regards to CHF.   PAST MEDICAL HISTORY:  Significant for:  1.  Longstanding tobacco dependence.  2.  History of thyroid disease.  3.  Hypothyroidism.  4.  History of COPD.  5.  History of __________ disease of the anus.  She has seen Dr. Ezzard Wilkins in      the past.   PAST SURGICAL  HISTORY:  1.  Has had a hysterectomy.  She does retain her ovaries.  2.  She has had a colonoscopy about five years ago.   MEDICATIONS:  She states she takes:  1.  Protonix 40 mg daily.  2.  Levoxyl 0.112 mg daily.  3.  Extra Strength Tylenol at night.  4.  Advair one inhaled b.i.d.   She stopped Zocor just last week.   ALLERGIES:  She reports that she has had some intolerances to cholesterol  medications, including Crestor and Vytorin, though on allergic reaction.  She reports no known drug allergies.   SOCIAL HISTORY:  Significant for one pack per day smoking history x 50  years.  Denies alcohol.  She lives with her husband.  She has one daughter.  She is a former Retail buyer.   FAMILY HISTORY:  Her mother is alive at age 60 with diabetes.  She is a care  Katie Wilkins for her.  Her father died and she is unaware of his medical  history.   REVIEW OF SYSTEMS:  She reports about a 15-pound weight loss over the past  three or four months.  She has had poor appetite.  She has had increased  dyspnea on exertion than before.  She was treated for bronchitis recently  with Ketek.  She completed a course a couple of weeks ago.  Lower extremity  edema has remained unchanged.  She has noticed no blood in her stools.  No  dysuria.  No productive cough.   PHYSICAL EXAMINATION:  VITAL SIGNS:  Stable.  GENERAL APPEARANCE:  She was alert and in no acute distress.  HEENT:  Her head was normocephalic and atraumatic.  Extraocular muscles were  intact.  There was no conjunctival pallor.  The oropharynx was clear.  NECK:  Supple and nontender.  No palpable mass.  CARDIOVASCULAR:  Normal S1 and S2 without S3 or S4.  LUNGS:  Clear, though breath sounds were diminished throughout.  ABDOMEN:  Soft and nontender.  No palpable hepatosplenomegaly, mass,  suprapubic tenderness or costovertebral angle tenderness.  EXTREMITIES:  The lower extremities reveal bipedal edema, +3.  Her  peripheral pulses  were symmetrical and palpable.  NEUROLOGIC:  There was no focal neurologic abnormality on the neurologic  exam.   LABORATORY DATA:  These are laboratories sent from her primary Katie Wilkins's  office, including a chemistry panel revealing a sodium of 142, potassium  3.7, BUN 18, creatinine 0.7 and glucose 92.  Iron studies revealed an iron  saturation of 4.  Her CBC revealed a WBC of 7.2, hemoglobin 12.4, platelet  count 227 andMCV 67.  The albumin was 3.7.  The chest x-ray did reveal some  cardiomegaly, but there were no active findings or edema.   IMPRESSION:  1.  Microcytic/iron deficiency anemia with fatigue and weight loss.  2.  Cardiomegaly on chest x-ray.  3.  Lower extremity edema.  4.  History of right ventricular failure per records from her primary care      physician's office.  5.  Likely chronic obstructive pulmonary disease.   PLAN:  Would continue to pursue etiology of [name]' anemia, including  a CT of the abdomen and a repeat colonoscopy.  In addition, would recommend  checking a 2-D echocardiogram to evaluate her LV function.  Check a troponin  to rule out recent event and follow her course clinically.  If there are no  abnormal findings on the abdomen, pelvis and colonoscopic exam, would  consider checking a CT of her chest.   Thank you for the opportunity to consult on [name].  Please feel free  to contact us with any further questions.  Will follow along with you during  her hospital course.      Katie Wilkins   ESS/MEDQ  D:  04/07/2004  T:  04/07/2004  Job:  [id number]   cc:   Katie Wilkins, M.D.  [address] [place]  [place] [address]  Fax: [phone]

## 2010-08-04 NOTE — Assessment & Plan Note (Signed)
[facility]                            CARDIOLOGY OFFICE NOTE   NAME:Katie Wilkins, Katie Wilkins                     MRN:          [id number]  DATE:02/25/2006                            DOB:          Jan 06, 1932    REFERRING PHYSICIAN:  Ernestina Penna, M.D.   CLINICAL HISTORY:  Katie Wilkins is 77 years old, and was hospitalized in  January 2006 with profound anemia and congestive heart failure.  She was  found to have nonobstructive disease at catheterization, and was felt to  have a nonischemic cardiomyopathy.  GI workup of her anemia found no  definite cause.  She subsequently improved and did much better.  We did  an ejection fraction on her in May 2007 which showed that her ejection  fraction improved to approximately 50%.   She says she has been feeling quite well.  She has had no chest pain or  palpitations.  She has shortness of breath related to her pulmonary  disease, but this has not changed.   PAST MEDICAL HISTORY:  Significant for chronic obstructive pulmonary  disease, and unfortunately, she has continued cigarette use.  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  She also has hyperlipidemia.   CURRENT MEDICATIONS:  Includes digoxin, aspirin, Lasix, K-Dur, Prilosec,  lisinopril, metoprolol, Lipitor and inhalers.   EXAMINATION:  Blood pressure 139/70.  Pulse 62 and regular.  There is no venous distention.  Carotid pulses were full without bruits.  CHEST:  Clear.  HEART:  Rhythm was regular.  I could hear no murmurs or gallops.  ABDOMEN:  Soft.  No organomegaly.  Peripheral pulses were full, and there was peripheral edema.   Electrocardiogram showed LVH with lateral T-wave changes.   IMPRESSION:  1. Nonischemic cardiomyopathy of uncertain etiology with improvement      in ejection fraction from 30% to 50%.  2. Nonobstructive coronary artery disease.  3. Chronic obstructive  pulmonary disease with continued tobacco use.  4. Iron deficiency anemia with negative gastrointestinal workup.   RECOMMENDATIONS:  I think Katie Wilkins is doing quite well.  We will plan  to get a BNP and CBC on her today.  I will plan to see her back in  followup in a year.  We will plan to do a repeat  echocardiogram at that time.  If her LV function remains normal, the we  will decide if she can come off any of her medications at that time.     Bruce Elvera Lennox Juanda Chance, MD, [facility]  Electronically Signed    BRB/MedQ  DD: 02/25/2006  DT: 02/25/2006  Job #: [id number]

## 2010-08-22 ENCOUNTER — Ambulatory Visit (INDEPENDENT_AMBULATORY_CARE_PROVIDER_SITE_OTHER): Payer: Medicare Other | Admitting: Gastroenterology

## 2010-08-22 ENCOUNTER — Encounter: Payer: Self-pay | Admitting: Gastroenterology

## 2010-08-22 VITALS — BP 100/68 | HR 78 | Ht 67.5 in | Wt 168.0 lb

## 2010-08-22 DIAGNOSIS — R195 Other fecal abnormalities: Secondary | ICD-10-CM

## 2010-08-22 DIAGNOSIS — K219 Gastro-esophageal reflux disease without esophagitis: Secondary | ICD-10-CM

## 2010-08-22 DIAGNOSIS — D509 Iron deficiency anemia, unspecified: Secondary | ICD-10-CM | POA: Insufficient documentation

## 2010-08-22 DIAGNOSIS — E611 Iron deficiency: Secondary | ICD-10-CM

## 2010-08-22 MED ORDER — TANDEM PLUS 162-115.2-1 MG PO CAPS: 1.0000 | ORAL_CAPSULE | Freq: Every day | ORAL | Status: DC

## 2010-08-22 NOTE — Patient Instructions (Signed)
Given Iron samples today. Continue all medication Make an appt to follow up with Dr Christell Constant in 3 month

## 2010-08-22 NOTE — Progress Notes (Signed)
This is a 77 year old Caucasian female with a nonischemic cardiomyopathy, COPD, and chronic iron deficiency excision of anal carcinoma in 1986 by Dr. Ovidio Kin with followup colonoscopy by Dr. Ezzard Standing on 2 occasions in the last year. She apparently has a lipoma in her right colon but no evidence of recurrent colon polyps. Because of her chronic iron deficiency with occasional positive Hemoccult cards, she has had Entero-capsule of her gut proximally a year ago. She does take regular aspirin 81 mg a day which was recently discontinued also guaiac positive stools. However, hemoglobin has been normal at approximately 13 but she does have evidence of iron deficiency on lab studies. She has been on oral iron replacement for several weeks. The patient denies any symptoms such as melena, hematochezia, abdominal pain, acid reflux or dysphagia. She is on daily PPI therapy. Family history noncontributory. She denies use of NSAIDs besides aspirin, and any smoking or alcohol abuse.  Current Medications, Allergies, Past Medical History, Past Surgical History, Family History and Social History were reviewed in [employer] record.  Pertinent Review of Systems Negative///no active cardiovascular or pulmonary complaints at this time. Her appetite is good and her weight is stable. She denies shortness of breath, exertional chest pain, palpitations, or any history of neurovascular problems.   Physical Exam: Awake and alert no acute distress appears stated age. I cannot appreciate stigmata of chronic liver disease. Her chest is generally clear and she appears to be in a regular rhythm without murmurs gallops or rubs. Cannot appreciate hepatosplenomegaly, abdominal masses or tenderness. Bowel sounds are normal. Peripheral extremities are unremarkable and mental status is normal.    Assessment and Plan: Intermittent occult positive stools with negative GI evaluation area and I suspect she has some  occult blood loss from gut telangiectasiae and also aspirin use. We will continue her in replacement, avoid aspirin and NSAIDs, however have Dr. Vernon Prey repeat her CBC and iron studies in 3 months. We'll request one os report from Dr. Ezzard Standing for our records, but I do not think she needs repeat endoscopy or colonoscopy at this time. We also should repeat IFOB stool cards per Dr. Christell Constant several months off aspirin therapy.  Please copy her primary care physician, referring physician, and pertinent subspecialists.

## 2010-08-23 ENCOUNTER — Encounter: Payer: Self-pay | Admitting: Gastroenterology

## 2011-07-18 LAB — FECAL OCCULT BLOOD, GUAIAC: Fecal Occult Blood: POSITIVE

## 2011-08-20 ENCOUNTER — Other Ambulatory Visit: Payer: Self-pay | Admitting: Dermatology

## 2012-01-18 LAB — HM MAMMOGRAPHY

## 2012-02-20 LAB — LIPID PANEL: Cholesterol: 142 mg/dL (ref 0–200)

## 2012-04-01 LAB — CBC AND DIFFERENTIAL
HCT: 35 % — AB (ref 36–46)
Hemoglobin: 11.8 g/dL — AB (ref 12.0–16.0)
Platelets: 193 10*3/uL (ref 150–399)

## 2012-04-01 LAB — HEPATIC FUNCTION PANEL
ALT: 16 U/L (ref 7–35)
AST: 24 U/L (ref 13–35)
Bilirubin, Total: 0.5 mg/dL

## 2012-04-01 LAB — BASIC METABOLIC PANEL
Creatinine: 0.7 mg/dL (ref <=1.1)
Potassium: 3.7 mmol/L (ref 3.4–5.3)
Sodium: 142 mmol/L (ref 137–147)

## 2012-04-10 ENCOUNTER — Telehealth: Payer: Self-pay | Admitting: *Deleted

## 2012-04-10 NOTE — Telephone Encounter (Signed)
Pt saw Dr Jarold Motto 08/22/10 for anemia and +FOBT. She was to f/u FOBT testing with Dr Christell Constant after stopping ASA . Joyce Gross at [facility] called to report pt is borderline anemic and is still testing + for blood in her stool; she is off ASA per Joyce Gross. Pt will f/u with Dr Jarold Motto on 04/15/12. Joyce Gross will inform the pt.

## 2012-04-15 ENCOUNTER — Ambulatory Visit: Payer: Medicare Other | Admitting: Gastroenterology

## 2012-04-18 ENCOUNTER — Other Ambulatory Visit (INDEPENDENT_AMBULATORY_CARE_PROVIDER_SITE_OTHER): Payer: Medicare Other

## 2012-04-18 ENCOUNTER — Ambulatory Visit (INDEPENDENT_AMBULATORY_CARE_PROVIDER_SITE_OTHER): Payer: Medicare Other | Admitting: Gastroenterology

## 2012-04-18 ENCOUNTER — Encounter: Payer: Self-pay | Admitting: Gastroenterology

## 2012-04-18 VITALS — BP 130/64 | HR 76 | Ht 65.5 in | Wt 160.5 lb

## 2012-04-18 DIAGNOSIS — D649 Anemia, unspecified: Secondary | ICD-10-CM

## 2012-04-18 DIAGNOSIS — Z8719 Personal history of other diseases of the digestive system: Secondary | ICD-10-CM

## 2012-04-18 DIAGNOSIS — Z8639 Personal history of other endocrine, nutritional and metabolic disease: Secondary | ICD-10-CM

## 2012-04-18 DIAGNOSIS — Z862 Personal history of diseases of the blood and blood-forming organs and certain disorders involving the immune mechanism: Secondary | ICD-10-CM

## 2012-04-18 LAB — VITAMIN B12: Vitamin B-12: 950 pg/mL — ABNORMAL HIGH (ref 211–911)

## 2012-04-18 LAB — FERRITIN: Ferritin: 8.3 ng/mL — ABNORMAL LOW (ref 10.0–291.0)

## 2012-04-18 LAB — FOLATE: Folate: 12.6 ng/mL (ref >5.9)

## 2012-04-18 NOTE — Patient Instructions (Addendum)
You have been given a separate informational sheet regarding your tobacco use, the importance of quitting and local resources to help you quit.  Your physician has requested that you go to the basement for lab work before leaving today  Please follow up as needed

## 2012-04-18 NOTE — Progress Notes (Signed)
This is a 79 year old Caucasian female with chronic small bowel telangiectasias and chronic GI bleeding.  His had several complete GI evaluations including pill camera exam of her gut.  She currently is completely asymptomatic on Nexium 40 mg a day.  She no longer takes salicylates.  Recent hemoglobin was 11.8.  She denies melena, hematochezia, or any GI symptoms except for loose stools daily.  Is not been on oral iron therapy and over 6 months.  She otherwise denies gastrointestinal symptoms.  She is on B12 parenteral replacement therapy.  Current Medications, Allergies, Past Medical History, Past Surgical History, Family History and Social History were reviewed in [employer] record.  ROS: All systems were reviewed and are negative unless otherwise stated in the HPI.          Physical Exam: appears older than her stated age.  Blood pressure 130/64, pulse 76 and regular, and weight 160 pounds the BMI of 26.30.  95% oxygen saturation.  Abdominal exam shows no distention, organomegaly, masses or tenderness.  Bowel sounds are normal.  Rectal exam was deferred.  Mental status is normal.    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  She no longer is on aspirin, and does take Nexium daily for acid reflux, also B12 shots.  We will check her serum iron levels and ferritin, but I do not think she needs repeat GI evaluation at this time.  Apparently her stools are always trace guaiac positive.  She may need to be on low-dose of chronic oral iron therapy depending on her lab results.  We'll follow her on a when necessary basis as needed.  Actually, review of her pill camera exam in October of 2010 suggested a 3-4 mm ileal nodule probably of no significance.  CT scan of the abdomen in April 2012 at [facility] showed a duodenal and jejunal diverticulum.  She is followed by Dr. Ovidio Kin in surgery for a  history of squamous cell carcinoma of the rectum resected.  He has done several colonoscopies on this patient which have been negative except for small right colon lipomas.  This apparently was last done in 2010.  CC: Dr. Christell Constant and Dr. Ovidio Kin Encounter Diagnosis  Name Primary?  Marland Kitchen Anemia Yes

## 2012-05-21 ENCOUNTER — Encounter: Payer: Self-pay | Admitting: *Deleted

## 2012-05-21 DIAGNOSIS — E559 Vitamin D deficiency, unspecified: Secondary | ICD-10-CM | POA: Insufficient documentation

## 2012-06-10 ENCOUNTER — Encounter: Payer: Self-pay | Admitting: Family Medicine

## 2012-06-10 ENCOUNTER — Ambulatory Visit (INDEPENDENT_AMBULATORY_CARE_PROVIDER_SITE_OTHER): Payer: Medicare Other | Admitting: Family Medicine

## 2012-06-10 VITALS — BP 98/51 | HR 76 | Temp 97.0°F | Ht 66.5 in | Wt 155.0 lb

## 2012-06-10 DIAGNOSIS — E611 Iron deficiency: Secondary | ICD-10-CM

## 2012-06-10 DIAGNOSIS — J4489 Other specified chronic obstructive pulmonary disease: Secondary | ICD-10-CM

## 2012-06-10 DIAGNOSIS — E559 Vitamin D deficiency, unspecified: Secondary | ICD-10-CM

## 2012-06-10 DIAGNOSIS — Z23 Encounter for immunization: Secondary | ICD-10-CM

## 2012-06-10 DIAGNOSIS — D509 Iron deficiency anemia, unspecified: Secondary | ICD-10-CM

## 2012-06-10 DIAGNOSIS — D049 Carcinoma in situ of skin, unspecified: Secondary | ICD-10-CM

## 2012-06-10 DIAGNOSIS — R634 Abnormal weight loss: Secondary | ICD-10-CM

## 2012-06-10 DIAGNOSIS — J449 Chronic obstructive pulmonary disease, unspecified: Secondary | ICD-10-CM

## 2012-06-10 DIAGNOSIS — E785 Hyperlipidemia, unspecified: Secondary | ICD-10-CM

## 2012-06-10 LAB — POCT CBC
Granulocyte percent: 60.6 %G (ref 37–80)
Hemoglobin: 14.4 g/dL (ref 12.2–16.2)
Lymph, poc: 3.4 (ref 0.6–3.4)
MCH, POC: 25.1 pg — AB (ref 27–31.2)
MPV: 8.1 fL (ref 0–99.8)
POC Granulocyte: 6.2 (ref 2–6.9)
POC LYMPH PERCENT: 3.1 %L — AB (ref 10–50)
Platelet Count, POC: 199 10*3/uL (ref 142–424)
RBC: 5.7 M/uL — AB (ref 4.04–5.48)

## 2012-06-10 LAB — BASIC METABOLIC PANEL WITH GFR
CO2: 33 mEq/L — ABNORMAL HIGH (ref 19–32)
Chloride: 100 mEq/L (ref 96–112)
GFR, Est Non African American: 80 mL/min
Potassium: 3.5 mEq/L (ref 3.5–5.3)
Sodium: 142 mEq/L (ref 135–145)

## 2012-06-10 LAB — HEPATIC FUNCTION PANEL
AST: 20 U/L (ref 0–37)
Albumin: 4.1 g/dL (ref 3.5–5.2)
Alkaline Phosphatase: 80 U/L (ref 39–117)
Bilirubin, Direct: 0.1 mg/dL (ref 0.0–0.3)
Total Bilirubin: 0.5 mg/dL (ref 0.3–1.2)

## 2012-06-10 MED ORDER — CYANOCOBALAMIN 1000 MCG/ML IJ SOLN
1000.0000 ug | INTRAMUSCULAR | Status: DC
  Administered 2012-06-10 – 2015-09-26 (×38): 1000 ug via INTRAMUSCULAR

## 2012-06-10 MED ORDER — PNEUMOCOCCAL VAC POLYVALENT 25 MCG/0.5ML IJ INJ
0.5000 mL | INJECTION | Freq: Once | INTRAMUSCULAR | Status: AC
  Administered 2012-06-10: 0.5 mL via INTRAMUSCULAR

## 2012-06-10 NOTE — Patient Instructions (Addendum)
Continue current meds and therapeutic lifestyle changes Patient will take note this note to see if livalo will be paid for We will continue to monitor weight loss Discussed with Dr. Jarold Motto with labs

## 2012-06-10 NOTE — Progress Notes (Signed)
Subjective:    [name], female    DOB: 08-26-1931, 79 y.o.   MRN: [id number]  HPI This patient presents for recheck of multiple medical problems. Her daughter  accompanies the patient today.  Patient Active Problem List  Diagnosis  . NEOPLASM, MALIGNANT, ANAL CANAL, SQUAMOUS CELL  . BOWEN'S DISEASE  . UNSPECIFIED HYPOTHYROIDISM  . HYPERLIPIDEMIA  . Unspecified iron deficiency anemia  . CARDIOMYOPATHY  . CHF  . HEMORRHOIDS  . COPD  . HIATAL HERNIA  . DIVERTICULOSIS, COLON  . RECTAL BLEEDING  . RENAL CYST  . ARTHRITIS  . Iron deficiency  . GERD (gastroesophageal reflux disease)  . Guaiac positive stools  . Vitamin D deficiency      The allergies, current medications, past medical history, surgical history, family and social history are reviewed.  Immunizations reviewed.  Health maintenance reviewed.  The following items are outstanding: She is in need of a Pneumovax today      Review of Systems  Constitutional: Positive for unexpected weight change (11 lb wt loss).  HENT: Positive for congestion (chest) and postnasal drip.   Eyes: Negative.   Respiratory: Positive for cough (occasional) and wheezing (occasional).   Cardiovascular: Positive for leg swelling (bilateral).  Gastrointestinal: Positive for diarrhea (most every day). Negative for nausea, vomiting, abdominal pain, abdominal distention, anal bleeding and rectal pain.  Genitourinary: Negative.   Musculoskeletal: Positive for arthralgias (knees).  Neurological: Positive for dizziness (occasional). Negative for headaches.  Psychiatric/Behavioral: Positive for sleep disturbance (occasional).       Objective:   Physical Exam  Nursing note and vitals reviewed. Constitutional: She is oriented to person, place, and time. She appears well-developed and well-nourished.  HENT:  Head: Normocephalic and atraumatic.  Right Ear: External ear normal.  Left Ear: External ear normal.  Nose:  Nose normal.  Mouth/Throat: Oropharynx is clear and moist.  Eyes: Conjunctivae and EOM are normal.  Neck: Normal range of motion. Neck supple. No JVD present. No tracheal deviation present. No thyromegaly present.  Left supraclavicular bruit  Cardiovascular: Normal rate, regular rhythm and normal heart sounds.   No murmur heard. Pulmonary/Chest: Effort normal. She has wheezes. She has rales.  Abdominal: Soft. Bowel sounds are normal. She exhibits no distension and no mass. There is no tenderness. There is no rebound and no guarding.  Musculoskeletal: Normal range of motion.  Lymphadenopathy:    She has no cervical adenopathy.  Neurological: She is alert and oriented to person, place, and time. She has normal reflexes.  Skin: Skin is dry.  Psychiatric: She has a normal mood and affect. Her behavior is normal. Judgment and thought content normal.          Assessment & Plan:    1. Anemia, iron deficiency  - cyanocobalamin ((VITAMIN B-12)) injection 1,000 mcg; Inject 1 mL (1,000 mcg total) into the muscle every 30 (thirty) days. - POCT CBC  2. BOWEN'S DISEASE  - POCT CBC  3. Unspecified iron deficiency anemia  - POCT CBC; Future  4. COPD  - pneumococcal 23 valent vaccine (PNU-IMMUNE) injection 0.5 mL; Inject 0.5 mLs into the muscle once. - BASIC METABOLIC PANEL WITH GFR; Future  5. Iron deficiency  - POCT CBC  6. Vitamin D deficiency  - NMR Lipoprofile with Lipids; Future - Vitamin D 25 hydroxy; Future  7. HYPERLIPIDEMIA  - NMR Lipoprofile with Lipids; Future - Hepatic function panel; Future - BASIC METABOLIC PANEL WITH GFR; Future - Hepatic function panel  8. Loss of  weight  - BASIC METABOLIC PANEL WITH GFR    Plan As of note patient refuses chest CT and colonoscopy

## 2012-06-13 ENCOUNTER — Other Ambulatory Visit: Payer: Self-pay | Admitting: *Deleted

## 2012-06-13 DIAGNOSIS — Z1212 Encounter for screening for malignant neoplasm of rectum: Secondary | ICD-10-CM

## 2012-06-13 NOTE — Addendum Note (Signed)
Addended by: Tommas Olp on: 06/13/2012 01:34 PM   Modules accepted: Orders

## 2012-06-14 LAB — FECAL OCCULT BLOOD, IMMUNOCHEMICAL: Fecal Occult Blood: NEGATIVE

## 2012-07-14 ENCOUNTER — Ambulatory Visit (INDEPENDENT_AMBULATORY_CARE_PROVIDER_SITE_OTHER): Payer: Medicare Other | Admitting: *Deleted

## 2012-07-14 DIAGNOSIS — E559 Vitamin D deficiency, unspecified: Secondary | ICD-10-CM

## 2012-07-14 DIAGNOSIS — E538 Deficiency of other specified B group vitamins: Secondary | ICD-10-CM

## 2012-07-14 DIAGNOSIS — E785 Hyperlipidemia, unspecified: Secondary | ICD-10-CM

## 2012-07-14 DIAGNOSIS — D509 Iron deficiency anemia, unspecified: Secondary | ICD-10-CM

## 2012-07-14 NOTE — Progress Notes (Signed)
Tolerated well

## 2012-07-15 LAB — NMR LIPOPROFILE WITH LIPIDS
Cholesterol, Total: 140 mg/dL (ref <200)
HDL Size: 8.7 nm — ABNORMAL LOW (ref >=9.2)
LDL (calc): 67 mg/dL (ref <100)
LDL Particle Number: 1402 nmol/L — ABNORMAL HIGH (ref <1000)
LP-IR Score: 69 — ABNORMAL HIGH (ref <=45)
Large VLDL-P: 3 nmol/L — ABNORMAL HIGH (ref <=2.7)
Triglycerides: 180 mg/dL — ABNORMAL HIGH (ref <150)
VLDL Size: 46.7 nm — ABNORMAL HIGH (ref <=46.6)

## 2012-08-06 ENCOUNTER — Ambulatory Visit (INDEPENDENT_AMBULATORY_CARE_PROVIDER_SITE_OTHER): Payer: Medicare Other

## 2012-08-06 ENCOUNTER — Ambulatory Visit (INDEPENDENT_AMBULATORY_CARE_PROVIDER_SITE_OTHER): Payer: Medicare Other | Admitting: Family Medicine

## 2012-08-06 ENCOUNTER — Encounter: Payer: Self-pay | Admitting: Family Medicine

## 2012-08-06 VITALS — BP 134/63 | HR 58 | Temp 97.3°F | Ht 66.5 in | Wt 158.2 lb

## 2012-08-06 DIAGNOSIS — M79671 Pain in right foot: Secondary | ICD-10-CM

## 2012-08-06 DIAGNOSIS — M79609 Pain in unspecified limb: Secondary | ICD-10-CM

## 2012-08-06 NOTE — Patient Instructions (Signed)
Use warm wet compresses 20 minutes 3 or 4 times daily Elevate when possible Try Advil 200 mg after breakfast and supper or try Aleve 1 after breakfast and supper If any stomach or GI irritation like heartburn discontinue NSAID

## 2012-08-06 NOTE — Progress Notes (Addendum)
  Subjective:    [name], female    DOB: 11-25-1931, 79 y.o.   MRN: [id number]  HPI History of right foot pain for 24 hours. No history of any injury.    Review of Systems  Musculoskeletal: Positive for arthralgias (R foot pain with redness).       Objective:   Physical Exam Tenderness and rubor at the right fifth proximal metatarsal  [facility] reading (PRIMARY) by  Dr. Christell Constant  : Right foot: No sign of any fracture                                    Assessment & Plan:  1. Foot pain, right - Uric acid - DG Foot Complete Right; Future

## 2012-08-15 ENCOUNTER — Ambulatory Visit (INDEPENDENT_AMBULATORY_CARE_PROVIDER_SITE_OTHER): Payer: Medicare Other | Admitting: Family Medicine

## 2012-08-15 DIAGNOSIS — E538 Deficiency of other specified B group vitamins: Secondary | ICD-10-CM

## 2012-08-15 DIAGNOSIS — D509 Iron deficiency anemia, unspecified: Secondary | ICD-10-CM

## 2012-09-17 ENCOUNTER — Ambulatory Visit (INDEPENDENT_AMBULATORY_CARE_PROVIDER_SITE_OTHER): Payer: Medicare Other | Admitting: *Deleted

## 2012-09-17 DIAGNOSIS — D509 Iron deficiency anemia, unspecified: Secondary | ICD-10-CM

## 2012-10-07 ENCOUNTER — Ambulatory Visit (INDEPENDENT_AMBULATORY_CARE_PROVIDER_SITE_OTHER): Payer: Medicare Other | Admitting: Family Medicine

## 2012-10-07 ENCOUNTER — Encounter: Payer: Self-pay | Admitting: Family Medicine

## 2012-10-07 VITALS — BP 130/66 | HR 83 | Temp 97.0°F | Wt 156.4 lb

## 2012-10-07 DIAGNOSIS — J209 Acute bronchitis, unspecified: Secondary | ICD-10-CM

## 2012-10-07 DIAGNOSIS — H109 Unspecified conjunctivitis: Secondary | ICD-10-CM

## 2012-10-07 MED ORDER — POLYMYXIN B-TRIMETHOPRIM 10000-0.1 UNIT/ML-% OP SOLN: 1.0000 [drp] | OPHTHALMIC | Status: DC

## 2012-10-07 MED ORDER — AMOXICILLIN 875 MG PO TABS: 875.0000 mg | ORAL_TABLET | Freq: Two times a day (BID) | ORAL | Status: DC

## 2012-10-07 MED ORDER — METHYLPREDNISOLONE (PAK) 4 MG PO TABS: ORAL_TABLET | ORAL | Status: DC

## 2012-10-07 NOTE — Progress Notes (Signed)
  Subjective:    [name], female    DOB: 1931/09/19, 79 y.o.   MRN: [id number]  Cough This is a recurrent problem. The current episode started in the past 7 days. The problem has been gradually worsening. The problem occurs hourly. The cough is productive of sputum. Associated symptoms include nasal congestion, postnasal drip, shortness of breath and wheezing. Pertinent negatives include no chest pain, chills or fever. Associated symptoms comments: Bilaterally eyes are swollen and pt states every morning they are "stuck together". The symptoms are aggravated by pollens and exercise. She has tried steroid inhaler and a beta-agonist inhaler for the symptoms. The treatment provided mild relief. Her past medical history is significant for bronchitis and COPD.      Review of Systems  Constitutional: Negative for fever and chills.  HENT: Positive for congestion and postnasal drip.   Eyes: Positive for discharge and itching.       Eyes swollen  Respiratory: Positive for cough, shortness of breath and wheezing.   Cardiovascular: Negative for chest pain.  All other systems reviewed and are negative.       Objective:   Physical Exam  Vitals reviewed. Constitutional: She is oriented to person, place, and time. She appears well-developed and well-nourished.  HENT:  Head: Normocephalic.  Eyes: Pupils are equal, round, and reactive to light. Right eye exhibits discharge. Left eye exhibits discharge.  Eyes swollen  Neck: Normal range of motion. Neck supple. No thyromegaly present.  Cardiovascular: Normal rate, regular rhythm, normal heart sounds and intact distal pulses.   Pulmonary/Chest: Effort normal. She has wheezes.  Diminished & coarse breath sounds bilaterally with expiratory wheezes noted.   Abdominal: Soft. Bowel sounds are normal. She exhibits no distension. There is no tenderness.  Musculoskeletal: Normal range of motion. She exhibits no edema.  Neurological: She is  alert and oriented to person, place, and time.  Skin: Skin is warm and dry.  Psychiatric: She has a normal mood and affect. Her behavior is normal. Judgment and thought content normal.    BP 130/66  Pulse 83  Temp(Src) 97 F (36.1 C) (Oral)  Wt 156 lb 6.4 oz (70.943 kg)  BMI 24.87 kg/m2  SpO2 87%       Assessment & Plan:  Conjunctivitis - Plan: trimethoprim-polymyxin b (POLYTRIM) ophthalmic solution  Acute bronchitis - Plan: methylPREDNIsolone (MEDROL DOSPACK) 4 MG tablet, DISCONTINUED: amoxicillin (AMOXIL) 875 MG tablet

## 2012-10-07 NOTE — Patient Instructions (Signed)

## 2012-10-09 ENCOUNTER — Encounter: Payer: Self-pay | Admitting: Family Medicine

## 2012-10-09 ENCOUNTER — Ambulatory Visit (INDEPENDENT_AMBULATORY_CARE_PROVIDER_SITE_OTHER): Payer: Medicare Other

## 2012-10-09 ENCOUNTER — Ambulatory Visit (INDEPENDENT_AMBULATORY_CARE_PROVIDER_SITE_OTHER): Payer: Medicare Other | Admitting: Family Medicine

## 2012-10-09 VITALS — BP 121/64 | HR 56 | Temp 97.2°F | Ht 66.0 in | Wt 156.0 lb

## 2012-10-09 DIAGNOSIS — J449 Chronic obstructive pulmonary disease, unspecified: Secondary | ICD-10-CM

## 2012-10-09 DIAGNOSIS — E039 Hypothyroidism, unspecified: Secondary | ICD-10-CM

## 2012-10-09 DIAGNOSIS — K219 Gastro-esophageal reflux disease without esophagitis: Secondary | ICD-10-CM

## 2012-10-09 DIAGNOSIS — R5381 Other malaise: Secondary | ICD-10-CM

## 2012-10-09 DIAGNOSIS — E785 Hyperlipidemia, unspecified: Secondary | ICD-10-CM

## 2012-10-09 DIAGNOSIS — R0602 Shortness of breath: Secondary | ICD-10-CM

## 2012-10-09 DIAGNOSIS — R195 Other fecal abnormalities: Secondary | ICD-10-CM

## 2012-10-09 DIAGNOSIS — R5383 Other fatigue: Secondary | ICD-10-CM

## 2012-10-09 DIAGNOSIS — E559 Vitamin D deficiency, unspecified: Secondary | ICD-10-CM

## 2012-10-09 DIAGNOSIS — E611 Iron deficiency: Secondary | ICD-10-CM

## 2012-10-09 DIAGNOSIS — J4489 Other specified chronic obstructive pulmonary disease: Secondary | ICD-10-CM

## 2012-10-09 DIAGNOSIS — D509 Iron deficiency anemia, unspecified: Secondary | ICD-10-CM

## 2012-10-09 MED ORDER — LEVOFLOXACIN 500 MG PO TABS: 500.0000 mg | ORAL_TABLET | Freq: Every day | ORAL | Status: DC

## 2012-10-09 MED ORDER — METHYLPREDNISOLONE ACETATE 80 MG/ML IJ SUSP
60.0000 mg | Freq: Once | INTRAMUSCULAR | Status: AC
  Administered 2012-10-09: 60 mg via INTRAMUSCULAR

## 2012-10-09 NOTE — Addendum Note (Signed)
Addended by: Orma Render F on: 10/09/2012 11:08 AM   Modules accepted: Orders

## 2012-10-09 NOTE — Patient Instructions (Addendum)
Repeat pulse ox  about 2 weeks Drink plenty of fluids Take Mucinex regularly Take medication regularly and use inhalers regularly

## 2012-10-09 NOTE — Addendum Note (Signed)
Addended by: Orma Render F on: 10/09/2012 11:32 AM   Modules accepted: Orders

## 2012-10-09 NOTE — Progress Notes (Addendum)
Subjective:     [name], female   DOB: [birth year], 79 y.o.   MRN: [id number]  HPI Patient returns today with her daughter for followup of chronic medical problems. These include hyperlipidemia, hypothyroidism, a history of a cardiomyopathy, GERD, iron deficiency and vitamin D deficiency. She also has COPD and she is still smoking. She was here recently and is being treated with antibiotics for a respiratory infection.  Review of Systems  HENT: Positive for congestion.   Eyes: Positive for itching.  Respiratory: Positive for cough.   Gastrointestinal: Positive for diarrhea.       Objective:   Physical Exam BP 121/64  Pulse 56  Temp(Src) 97.2 F (36.2 C) (Oral)  Ht 5\' 6"  (1.676 m)  Wt 156 lb (70.761 kg)  BMI 25.19 kg/m2  SpO2 89%  The patient appeared well nourished and normally developed for her age, alert and oriented to time and place. Speech, behavior and judgement appear normal. Vital signs as documented.  Head exam is unremarkable. No scleral icterus or pallor noted.  Neck is without jugular venous distension, thyromegally, or carotid bruits. Carotid upstrokes are brisk bilaterally. No cervical adenopathy. Lungs are clear anteriorly and posteriorly to auscultation. Normal respiratory effort. Cardiac exam reveals regular rate and rhythm. First and second heart sounds normal.  No murmurs, rubs or gallops.  Abdominal exam reveals normal bowl sounds, no masses, no organomegaly and no aortic enlargement. No inguinal adenopathy. Extremities are nonedematous and both femoral and pedal pulses are normal. Skin without pallor or jaundice.  Warm and dry, without rash. Neurologic exam reveals normal deep tendon reflexes and normal sensation.  [facility] reading (PRIMARY) by  Dr.Nyna Chilton: Chest x-ray: Enlarged heart                                        Assessment:     1. Vitamin D deficiency  2. Unspecified iron deficiency anemia  3. Unspecified  hypothyroidism  4. COPD - DG Chest 2 View; Future  5. GERD (gastroesophageal reflux disease)  6. Guaiac positive stools  7. HYPERLIPIDEMIA  8. Iron deficiency  9. Shortness of breath - DG Chest 2 View; Future -CBC pending -BMP pending  Patient Instructions  Repeat pulse ox  about 2 weeks Drink plenty of fluids Take Mucinex regularly Take medication regularly and use inhalers regularly    Nyra Capes MD

## 2012-10-09 NOTE — Addendum Note (Signed)
Addended by: Orma Render F on: 10/09/2012 11:00 AM   Modules accepted: Orders

## 2012-10-09 NOTE — Addendum Note (Signed)
Addended by: Bearl Mulberry on: 10/09/2012 02:38 PM   Modules accepted: Orders

## 2012-10-09 NOTE — Addendum Note (Signed)
Addended by: Orma Render F on: 10/09/2012 11:01 AM   Modules accepted: Orders

## 2012-10-10 LAB — BASIC METABOLIC PANEL WITH GFR
CO2: 33 mEq/L — ABNORMAL HIGH (ref 19–32)
Calcium: 9.5 mg/dL (ref 8.4–10.5)
Chloride: 105 mEq/L (ref 96–112)
Glucose, Bld: 119 mg/dL — ABNORMAL HIGH (ref 70–99)
Sodium: 147 mEq/L — ABNORMAL HIGH (ref 135–145)

## 2012-10-10 LAB — CBC WITH DIFFERENTIAL/PLATELET
Eosinophils Absolute: 0 10*3/uL (ref 0.0–0.7)
Eosinophils Relative: 0 % (ref 0–5)
Hemoglobin: 13.6 g/dL (ref 12.0–15.0)
Lymphocytes Relative: 12 % (ref 12–46)
Lymphs Abs: 1 10*3/uL (ref 0.7–4.0)
MCH: 26.8 pg (ref 26.0–34.0)
MCV: 80.7 fL (ref 78.0–100.0)
Monocytes Relative: 5 % (ref 3–12)
RBC: 5.08 MIL/uL (ref 3.87–5.11)

## 2012-10-20 ENCOUNTER — Ambulatory Visit (INDEPENDENT_AMBULATORY_CARE_PROVIDER_SITE_OTHER): Payer: Medicare Other | Admitting: Family Medicine

## 2012-10-20 VITALS — BP 85/49 | HR 72 | Temp 97.5°F | Ht 66.0 in | Wt 147.6 lb

## 2012-10-20 DIAGNOSIS — J440 Chronic obstructive pulmonary disease with acute lower respiratory infection: Secondary | ICD-10-CM

## 2012-10-20 DIAGNOSIS — D509 Iron deficiency anemia, unspecified: Secondary | ICD-10-CM

## 2012-10-20 DIAGNOSIS — R5381 Other malaise: Secondary | ICD-10-CM

## 2012-10-20 DIAGNOSIS — J189 Pneumonia, unspecified organism: Secondary | ICD-10-CM

## 2012-10-20 DIAGNOSIS — J209 Acute bronchitis, unspecified: Secondary | ICD-10-CM

## 2012-10-20 DIAGNOSIS — R5383 Other fatigue: Secondary | ICD-10-CM

## 2012-10-20 DIAGNOSIS — R0602 Shortness of breath: Secondary | ICD-10-CM

## 2012-10-20 LAB — POCT CBC
Granulocyte percent: 74.1 %G (ref 37–80)
Lymph, poc: 2.7 (ref 0.6–3.4)
MCV: 80.8 fL (ref 80–97)
MPV: 8.1 fL (ref 0–99.8)
POC Granulocyte: 9.8 — AB (ref 2–6.9)
Platelet Count, POC: 204 10*3/uL (ref 142–424)
RBC: 5.8 M/uL — AB (ref 4.04–5.48)
RDW, POC: 15.6 %

## 2012-10-20 MED ORDER — BUDESONIDE 0.5 MG/2ML IN SUSP
0.2500 mg | Freq: Once | RESPIRATORY_TRACT | Status: AC
  Administered 2012-10-20: 0.25 mg via RESPIRATORY_TRACT

## 2012-10-20 MED ORDER — ALBUTEROL SULFATE (2.5 MG/3ML) 0.083% IN NEBU
2.5000 mg | INHALATION_SOLUTION | Freq: Once | RESPIRATORY_TRACT | Status: AC
  Administered 2012-10-20: 2.5 mg via RESPIRATORY_TRACT

## 2012-10-20 MED ORDER — ALBUTEROL SULFATE (2.5 MG/3ML) 0.083% IN NEBU: 2.5000 mg | INHALATION_SOLUTION | Freq: Four times a day (QID) | RESPIRATORY_TRACT | Status: DC | PRN

## 2012-10-20 MED ORDER — BUDESONIDE 0.5 MG/2ML IN SUSP: 0.5000 mg | Freq: Two times a day (BID) | RESPIRATORY_TRACT | Status: DC

## 2012-10-21 ENCOUNTER — Telehealth: Payer: Self-pay | Admitting: Family Medicine

## 2012-10-21 ENCOUNTER — Encounter: Payer: Self-pay | Admitting: Family Medicine

## 2012-10-21 NOTE — Telephone Encounter (Signed)
Almira Coaster R took care of this

## 2012-10-21 NOTE — Progress Notes (Signed)
Subjective:    [name], female    DOB: [birth year], 79 y.o.   MRN: [id number]  HPI Patient came in for injection with her daughter and son-in-law. Because she was not feeling better from a recent bout with pneumonia it was requested that we check her at this time of the visit.   Review of Systems  Constitutional: Positive for appetite change (decreased) and fatigue. Negative for fever.  HENT: Negative for congestion, sore throat, rhinorrhea and trouble swallowing.   Respiratory: Positive for cough, chest tightness, shortness of breath and wheezing.   Cardiovascular: Negative for chest pain and palpitations.  Gastrointestinal: Negative.   Neurological: Negative.        Objective:   Physical Exam  Vitals reviewed. Constitutional: She is oriented to person, place, and time.  Alert cooperative in no acute distress  HENT:  Head: Normocephalic and atraumatic.  Right Ear: External ear normal.  Left Ear: External ear normal.  Nose: Nose normal.  Mouth/Throat: Oropharynx is clear and moist. No oropharyngeal exudate.  Eyes: Conjunctivae are normal. No scleral icterus.  Neck: Normal range of motion. Neck supple. No thyromegaly present.  Cardiovascular: Normal rate and regular rhythm.   Pulmonary/Chest: Effort normal. She has wheezes.  Rhonchi and wheezes bilaterally Some of these would clear after taking deep breaths and cough  Abdominal: Soft. Bowel sounds are normal. She exhibits no mass. There is no tenderness. There is no guarding.  Musculoskeletal: Normal range of motion.  Lymphadenopathy:    She has no cervical adenopathy.  Neurological: She is alert and oriented to person, place, and time.  Skin: Skin is warm and dry. No rash noted.  Psychiatric: She has a normal mood and affect. Her behavior is normal. Judgment and thought content normal.   After receiving breathing treatments in the office her breath sounds were much much improved       Assessment &  Plan:  1. Pneumonia - POCT CBC - POCT CBC - budesonide (PULMICORT) nebulizer solution 0.25 mg; Take 1 mL (0.25 mg total) by nebulization once. - budesonide (PULMICORT) 0.5 MG/2ML nebulizer solution; Take 2 mLs (0.5 mg total) by nebulization 2 (two) times daily.  Dispense: 120 mL; Refill: 12 - albuterol (PROVENTIL) (2.5 MG/3ML) 0.083% nebulizer solution; Take 3 mLs (2.5 mg total) by nebulization every 6 (six) hours as needed for wheezing.  Dispense: 75 mL; Refill: 12 - albuterol (PROVENTIL) (2.5 MG/3ML) 0.083% nebulizer solution 2.5 mg; Take 3 mLs (2.5 mg total) by nebulization once.  2. COPD (chronic obstructive pulmonary disease) with acute bronchitis - budesonide (PULMICORT) nebulizer solution 0.25 mg; Take 1 mL (0.25 mg total) by nebulization once. - budesonide (PULMICORT) 0.5 MG/2ML nebulizer solution; Take 2 mLs (0.5 mg total) by nebulization 2 (two) times daily.  Dispense: 120 mL; Refill: 12 - albuterol (PROVENTIL) (2.5 MG/3ML) 0.083% nebulizer solution; Take 3 mLs (2.5 mg total) by nebulization every 6 (six) hours as needed for wheezing.  Dispense: 75 mL; Refill: 12 - albuterol (PROVENTIL) (2.5 MG/3ML) 0.083% nebulizer solution 2.5 mg; Take 3 mLs (2.5 mg total) by nebulization once.  3. Other malaise and fatigue  4. Shortness of breath  Patient Instructions  Patient was given nebulizer treatment with albuterol and Pulmicort in the office She was to continue these treatments at home She is to drink plenty of liquids including water She is to take Mucinex maximum strength 1 twice day She is to return to the clinic for recheck next week   Nyra Capes MD

## 2012-10-21 NOTE — Patient Instructions (Addendum)
Patient was given nebulizer treatment with albuterol and Pulmicort in the office She was to continue these treatments at home She is to drink plenty of liquids including water She is to take Mucinex maximum strength 1 twice day She is to return to the clinic for recheck next week

## 2012-10-23 ENCOUNTER — Ambulatory Visit (INDEPENDENT_AMBULATORY_CARE_PROVIDER_SITE_OTHER): Payer: Medicare Other | Admitting: Family Medicine

## 2012-10-23 ENCOUNTER — Encounter: Payer: Self-pay | Admitting: Family Medicine

## 2012-10-23 VITALS — BP 83/44 | HR 71 | Temp 97.2°F | Ht 66.5 in | Wt 145.4 lb

## 2012-10-23 DIAGNOSIS — J209 Acute bronchitis, unspecified: Secondary | ICD-10-CM

## 2012-10-23 DIAGNOSIS — J440 Chronic obstructive pulmonary disease with acute lower respiratory infection: Secondary | ICD-10-CM

## 2012-10-23 DIAGNOSIS — J189 Pneumonia, unspecified organism: Secondary | ICD-10-CM

## 2012-10-23 MED ORDER — ALBUTEROL SULFATE (2.5 MG/3ML) 0.083% IN NEBU: 2.5000 mg | INHALATION_SOLUTION | Freq: Four times a day (QID) | RESPIRATORY_TRACT | Status: DC | PRN

## 2012-10-23 MED ORDER — BUDESONIDE 0.5 MG/2ML IN SUSP: 0.5000 mg | Freq: Two times a day (BID) | RESPIRATORY_TRACT | Status: DC

## 2012-10-23 NOTE — Patient Instructions (Addendum)
Continue current meds Start taking multi vitamin and drink plenty of fluids Continue with Mucinex Add multivitamin, i.e. Centrum Silver one daily Try to improve eating habits and eating more so you do not lose more weight Continue neb treatments as doing

## 2012-10-23 NOTE — Progress Notes (Signed)
  Subjective:    [name], female    DOB: Dec 26, 1931, 79 y.o.   MRN: [id number]  HPI Patient comes in today for followup of her pneumonia and bronchitis with COPD. She comes in with her daughter. She appears to be in better spirits today and breathing better today. Her daughter agrees with this. There is still some concern about her low blood pressure which she has had a history of the past and her weight loss. She has a history of positive FOBT's but has been evaluated and no problems with were found causing this. In the review of systems today she was negative on the major organ systems.   Review of Systems  Constitutional: Positive for fatigue (improving). Negative for fever.  HENT: Positive for congestion.   Respiratory: Positive for cough (prod, slight). Negative for chest tightness, shortness of breath and wheezing.   Cardiovascular: Negative for chest pain, palpitations and leg swelling.  Gastrointestinal: Negative.  Negative for nausea, vomiting, diarrhea and constipation.  Genitourinary: Negative.  Negative for dysuria, urgency, vaginal bleeding, vaginal discharge and vaginal pain.  Musculoskeletal: Negative.  Negative for myalgias, joint swelling, arthralgias and gait problem.  Neurological: Negative for dizziness, tremors, weakness, light-headedness and headaches.  Psychiatric/Behavioral: Negative.  Negative for confusion and sleep disturbance. The patient is not nervous/anxious.        Objective:   Physical Exam  Nursing note and vitals reviewed. Constitutional: She is oriented to person, place, and time. No distress.  Patient was alert and cooperative and appears somewhat older than her stated age of 64. She was in no acute distress.  HENT:  Head: Normocephalic and atraumatic.  Right Ear: External ear normal.  Left Ear: External ear normal.  Nose: Nose normal.  Mouth/Throat: Oropharynx is clear and moist. No oropharyngeal exudate.  Eyes: Conjunctivae are  normal. Right eye exhibits no discharge. Left eye exhibits no discharge. No scleral icterus.  Neck: Normal range of motion. Neck supple. No thyromegaly present.  Cardiovascular: Normal rate and normal heart sounds.  Exam reveals no gallop and no friction rub.   No murmur heard. Pulmonary/Chest: Effort normal. She has wheezes. She has no rales. She exhibits no tenderness.  Diminished breath sounds bilaterally Tight cough  Musculoskeletal: Normal range of motion. She exhibits no edema.  Neurological: She is alert and oriented to person, place, and time.  Skin: Skin is warm and dry. No rash noted. She is not diaphoretic. No pallor.  Psychiatric: She has a normal mood and affect. Her behavior is normal. Judgment and thought content normal.   Repeat blood pressure 118/60       Assessment & Plan:  Pneumonia - Plan: albuterol (PROVENTIL) (2.5 MG/3ML) 0.083% nebulizer solution, budesonide (PULMICORT) 0.5 MG/2ML nebulizer solution  COPD (chronic obstructive pulmonary disease) with acute bronchitis - Plan: albuterol (PROVENTIL) (2.5 MG/3ML) 0.083% nebulizer solution, budesonide (PULMICORT) 0.5 MG/2ML nebulizer solution  Patient Instructions  Continue current meds Start taking multi vitamin and drink plenty of fluids Continue with Mucinex Add multivitamin, i.e. Centrum Silver one daily Try to improve eating habits and eating more so you do not lose more weight Continue neb treatments as doing   Nyra Capes MD

## 2012-11-06 ENCOUNTER — Ambulatory Visit (INDEPENDENT_AMBULATORY_CARE_PROVIDER_SITE_OTHER): Payer: Medicare Other

## 2012-11-06 ENCOUNTER — Ambulatory Visit (INDEPENDENT_AMBULATORY_CARE_PROVIDER_SITE_OTHER): Payer: Medicare Other | Admitting: Family Medicine

## 2012-11-06 ENCOUNTER — Encounter: Payer: Self-pay | Admitting: Family Medicine

## 2012-11-06 VITALS — BP 85/45 | HR 65 | Temp 97.2°F | Ht 66.5 in | Wt 144.6 lb

## 2012-11-06 DIAGNOSIS — J209 Acute bronchitis, unspecified: Secondary | ICD-10-CM

## 2012-11-06 DIAGNOSIS — J440 Chronic obstructive pulmonary disease with acute lower respiratory infection: Secondary | ICD-10-CM

## 2012-11-06 DIAGNOSIS — J189 Pneumonia, unspecified organism: Secondary | ICD-10-CM

## 2012-11-06 LAB — POCT CBC
Granulocyte percent: 74.6 %G (ref 37–80)
HCT, POC: 47.1 % (ref 37.7–47.9)
Hemoglobin: 15.2 g/dL (ref 12.2–16.2)
MCHC: 32.3 g/dL (ref 31.8–35.4)
POC Granulocyte: 8.2 — AB (ref 2–6.9)

## 2012-11-06 NOTE — Progress Notes (Signed)
Subjective:    [name], female    DOB: Sep 03, 1931, 79 y.o.   MRN: [id number]  HPI Patient comes in today for followup of her pneumonia. This was at the right lung base her daughter is with her today. She says that she is feeling better.    Review of Systems  Constitutional: Positive for fatigue. Negative for fever.  HENT: Positive for congestion.   Respiratory: Positive for cough. Negative for chest tightness, shortness of breath and wheezing.   Cardiovascular: Negative for chest pain, palpitations and leg swelling.  Gastrointestinal: Positive for constipation. Negative for nausea and vomiting.  Endocrine: Negative.   Genitourinary: Negative.  Negative for dysuria, urgency and flank pain.  Musculoskeletal: Negative.   Neurological: Negative.  Negative for dizziness, light-headedness and headaches.       Objective:   Physical Exam  Constitutional: She is oriented to person, place, and time. She appears well-developed and well-nourished. No distress.  HENT:  Head: Normocephalic.  Right Ear: External ear normal.  Left Ear: External ear normal.  Nose: Nose normal.  Mouth/Throat: Oropharynx is clear and moist. No oropharyngeal exudate.  Eyes: Conjunctivae are normal. Right eye exhibits no discharge. Left eye exhibits no discharge. No scleral icterus.  Neck: Normal range of motion. Neck supple. No thyromegaly present.  Cardiovascular: Normal rate.  Exam reveals no gallop and no friction rub.   No murmur heard. Slightly irregular at 72 per minute  Pulmonary/Chest: Effort normal. No respiratory distress. She has no wheezes. She has no rales.   Breath sounds are dimiished bilaterally anteriorly and posteriorly. A few rhonchi cleared with coughing.  Neurological: She is alert and oriented to person, place, and time.  Skin: Skin is warm and dry. No rash noted. She is not diaphoretic.  Psychiatric: She has a normal mood and affect. Her behavior is normal. Judgment and  thought content normal.    [facility] reading (PRIMARY) by  Dr. Christell Constant: Chest x-ray ; residual congestion, question area on lateral film anteriorly, will wait for over read by radiology.                                 Results for orders placed in visit on 11/06/12  POCT CBC      Result Value Range   WBC 11.0 (*) 4.6 - 10.2 K/uL   Lymph, poc 2.2  0.6 - 3.4   POC LYMPH PERCENT 20.1  10 - 50 %L   POC Granulocyte 8.2 (*) 2 - 6.9   Granulocyte percent 74.6  37 - 80 %G   RBC 5.8 (*) 4.04 - 5.48 M/uL   Hemoglobin 15.2  12.2 - 16.2 g/dL   HCT, POC 57.8  46.9 - 47.9 %   MCV 81.5  80 - 97 fL   MCH, POC 26.3 (*) 27 - 31.2 pg   MCHC 32.3  31.8 - 35.4 g/dL   RDW, POC 62.9     Platelet Count, POC 191.0  142 - 424 K/uL   MPV 8.2  0 - 99.8 fL   Patient's daughter is aware of the above before they left office       Assessment & Plan:  1. Pneumonia - POCT CBC; Standing - DG Chest 2 View - POCT CBC  2. COPD (chronic obstructive pulmonary disease) with acute bronchitis  Patient Instructions  Continue drinking plenty of fluids Continue nebulizer treatments--- at some point in the future we  may go back to Advair and albuterol as just a rescue inhaler Continue Mucinex twice daily for cough and congestion Patient and daughter understand drug management and her condition   Nyra Capes MD

## 2012-11-06 NOTE — Patient Instructions (Signed)
Continue drinking plenty of fluids Continue nebulizer treatments--- at some point in the future we may go back to Advair and albuterol as just a rescue inhaler Continue Mucinex twice daily for cough and congestion Patient and daughter understand drug management and her condition

## 2012-11-16 ENCOUNTER — Other Ambulatory Visit: Payer: Self-pay | Admitting: Family Medicine

## 2012-11-21 ENCOUNTER — Ambulatory Visit (INDEPENDENT_AMBULATORY_CARE_PROVIDER_SITE_OTHER): Payer: Medicare Other | Admitting: *Deleted

## 2012-11-21 ENCOUNTER — Other Ambulatory Visit: Payer: Self-pay | Admitting: Family Medicine

## 2012-11-21 DIAGNOSIS — E538 Deficiency of other specified B group vitamins: Secondary | ICD-10-CM

## 2012-11-21 DIAGNOSIS — D509 Iron deficiency anemia, unspecified: Secondary | ICD-10-CM

## 2012-11-21 MED ORDER — HYDROCHLOROTHIAZIDE 25 MG PO TABS: ORAL_TABLET | ORAL | Status: DC

## 2012-12-22 ENCOUNTER — Ambulatory Visit (INDEPENDENT_AMBULATORY_CARE_PROVIDER_SITE_OTHER): Payer: Medicare Other | Admitting: *Deleted

## 2012-12-22 DIAGNOSIS — D509 Iron deficiency anemia, unspecified: Secondary | ICD-10-CM

## 2012-12-22 DIAGNOSIS — E538 Deficiency of other specified B group vitamins: Secondary | ICD-10-CM

## 2012-12-22 NOTE — Progress Notes (Signed)
[name], female   DOB: Jul 30, 1931, 79 y.o.   MRN: [id number] Pt tolerated injection well

## 2013-01-13 ENCOUNTER — Ambulatory Visit: Payer: Medicare Other | Admitting: Family Medicine

## 2013-01-20 ENCOUNTER — Telehealth: Payer: Self-pay | Admitting: Family Medicine

## 2013-01-20 ENCOUNTER — Ambulatory Visit (INDEPENDENT_AMBULATORY_CARE_PROVIDER_SITE_OTHER): Payer: Medicare Other | Admitting: Family Medicine

## 2013-01-20 ENCOUNTER — Encounter: Payer: Self-pay | Admitting: Family Medicine

## 2013-01-20 VITALS — BP 91/58 | HR 65 | Temp 97.3°F | Ht 66.5 in | Wt 144.0 lb

## 2013-01-20 DIAGNOSIS — I509 Heart failure, unspecified: Secondary | ICD-10-CM

## 2013-01-20 DIAGNOSIS — R195 Other fecal abnormalities: Secondary | ICD-10-CM

## 2013-01-20 DIAGNOSIS — E039 Hypothyroidism, unspecified: Secondary | ICD-10-CM

## 2013-01-20 DIAGNOSIS — J189 Pneumonia, unspecified organism: Secondary | ICD-10-CM

## 2013-01-20 DIAGNOSIS — E611 Iron deficiency: Secondary | ICD-10-CM

## 2013-01-20 DIAGNOSIS — J440 Chronic obstructive pulmonary disease with acute lower respiratory infection: Secondary | ICD-10-CM

## 2013-01-20 DIAGNOSIS — J449 Chronic obstructive pulmonary disease, unspecified: Secondary | ICD-10-CM

## 2013-01-20 DIAGNOSIS — J209 Acute bronchitis, unspecified: Secondary | ICD-10-CM

## 2013-01-20 DIAGNOSIS — K219 Gastro-esophageal reflux disease without esophagitis: Secondary | ICD-10-CM

## 2013-01-20 DIAGNOSIS — E785 Hyperlipidemia, unspecified: Secondary | ICD-10-CM

## 2013-01-20 DIAGNOSIS — E559 Vitamin D deficiency, unspecified: Secondary | ICD-10-CM

## 2013-01-20 DIAGNOSIS — D509 Iron deficiency anemia, unspecified: Secondary | ICD-10-CM

## 2013-01-20 LAB — POCT CBC
Granulocyte percent: 70.7 %G (ref 37–80)
Hemoglobin: 15.3 g/dL (ref 12.2–16.2)
Lymph, poc: 2.8 (ref 0.6–3.4)
MPV: 8.3 fL (ref 0–99.8)
POC Granulocyte: 7 — AB (ref 2–6.9)
POC LYMPH PERCENT: 28.5 %L (ref 10–50)
Platelet Count, POC: 195 10*3/uL (ref 142–424)
RBC: 5.7 M/uL — AB (ref 4.04–5.48)

## 2013-01-20 MED ORDER — ALBUTEROL SULFATE (2.5 MG/3ML) 0.083% IN NEBU: 2.5000 mg | INHALATION_SOLUTION | Freq: Four times a day (QID) | RESPIRATORY_TRACT | Status: DC | PRN

## 2013-01-20 NOTE — Progress Notes (Signed)
Subjective:    [name], female    DOB: [birth year], 79 y.o.   MRN: [id number]  HPI Pt here for follow up and management of chronic medical problems. Patient comes in today with her daughter with no particular complaints. See her chronic medical problem list.      Patient Active Problem List   Diagnosis Date Noted  . Vitamin D deficiency   . Iron deficiency 08/22/2010  . GERD (gastroesophageal reflux disease) 08/22/2010  . Guaiac positive stools 08/22/2010  . BOWEN'S DISEASE 12/24/2008  . Hypothyroidism 12/24/2008  . HEMORRHOIDS 12/24/2008  . RECTAL BLEEDING 12/24/2008  . ARTHRITIS 12/24/2008  . NEOPLASM, MALIGNANT, ANAL CANAL, SQUAMOUS CELL 12/23/2008  . HYPERLIPIDEMIA 12/23/2008  . CARDIOMYOPATHY 12/23/2008  . CHF 12/23/2008  . COPD 12/23/2008  . HIATAL HERNIA 12/23/2008  . DIVERTICULOSIS, COLON 12/23/2008  . RENAL CYST 12/23/2008   Outpatient Encounter Prescriptions as of 01/20/2013  Medication Sig  . albuterol (PROVENTIL HFA;VENTOLIN HFA) 108 (90 BASE) MCG/ACT inhaler Inhale 1 puff into the lungs as needed.  Marland Kitchen albuterol (PROVENTIL) (2.5 MG/3ML) 0.083% nebulizer solution Take 3 mLs (2.5 mg total) by nebulization every 6 (six) hours as needed for wheezing.  . budesonide (PULMICORT) 0.5 MG/2ML nebulizer solution Take 2 mLs (0.5 mg total) by nebulization 2 (two) times daily.  . Cholecalciferol (VITAMIN D3) 1000 UNITS tablet Take 2,000 Units by mouth daily.   . cyanocobalamin (,VITAMIN B-12,) 1000 MCG/ML injection Inject 1,000 mcg into the muscle every 30 (thirty) days.   Marland Kitchen esomeprazole (NEXIUM) 40 MG capsule Take 40 mg by mouth daily before breakfast.    . hydrochlorothiazide (HYDRODIURIL) 25 MG tablet TAKE ONE TABLET BY MOUTH EVERY DAY  . levothyroxine (SYNTHROID, LEVOTHROID) 112 MCG tablet Take 112 mcg by mouth daily.  Marland Kitchen omega-3 acid ethyl esters (LOVAZA) 1 G capsule Take 1 g by mouth daily.   . Pitavastatin Calcium (LIVALO) 4 MG TABS Take 1 tablet by  mouth daily.  . [DISCONTINUED] atorvastatin (LIPITOR) 80 MG tablet Take 80 mg by mouth daily.    . Fluticasone-Salmeterol (ADVAIR DISKUS) 250-50 MCG/DOSE AEPB Inhale 1 puff into the lungs every 12 (twelve) hours.    . [DISCONTINUED] levothyroxine (LEVOXYL) 100 MCG tablet Take 100 mcg by mouth daily.    . [DISCONTINUED] metoprolol succinate (TOPROL-XL) 50 MG 24 hr tablet Take 50 mg by mouth daily. Take 1/2 tab with or immediately following a meal.  . [DISCONTINUED] olmesartan (BENICAR) 20 MG tablet Take 20 mg by mouth daily. 1/2 qd  . [DISCONTINUED] trimethoprim-polymyxin b (POLYTRIM) ophthalmic solution Place 1 drop into both eyes every 4 (four) hours.    Review of Systems  Constitutional: Negative.   HENT: Negative.   Eyes: Negative.   Respiratory: Negative.   Cardiovascular: Negative.   Gastrointestinal: Negative.   Endocrine: Negative.   Genitourinary: Negative.   Musculoskeletal: Negative.   Skin: Negative.   Allergic/Immunologic: Negative.   Neurological: Negative.   Hematological: Negative.   Psychiatric/Behavioral: Negative.        Objective:   Physical Exam  Nursing note and vitals reviewed. Constitutional: She is oriented to person, place, and time. She appears well-developed and well-nourished. No distress.  Somewhat older appearing than her stated age  HENT:  Head: Normocephalic and atraumatic.  Right Ear: External ear normal.  Left Ear: External ear normal.  Nose: Nose normal.  Mouth is somewhat dry  Eyes: Conjunctivae and EOM are normal. Pupils are equal, round, and reactive to light. Right eye exhibits no discharge.  Left eye exhibits no discharge. No scleral icterus.  Neck: Normal range of motion. Neck supple. No JVD present. No thyromegaly present.   There was a left supraclavicular bruit, good radial pulses bilaterally  Cardiovascular: Normal rate, regular rhythm and normal heart sounds.  Exam reveals no gallop and no friction rub.   No murmur heard. At 72  per minute, somewhat diminished pedal pulses bilaterally  Pulmonary/Chest: Effort normal. No respiratory distress. She has no wheezes. She has no rales. She exhibits no tenderness.  Diminished breath sounds bilaterally  Abdominal: Soft. She exhibits no distension and no mass. There is no tenderness. There is no rebound and no guarding.  Bowel sounds were quiet  Musculoskeletal: Normal range of motion. She exhibits no edema and no tenderness.  Lymphadenopathy:    She has no cervical adenopathy.  Neurological: She is alert and oriented to person, place, and time. She has normal reflexes. No cranial nerve deficit.  Skin: Skin is warm and dry.  Skin was extremely dry  Psychiatric: She has a normal mood and affect. Her behavior is normal. Judgment and thought content normal.   BP 91/58  Pulse 65  Temp(Src) 97.3 F (36.3 C) (Oral)  Ht 5' 6.5" (1.689 m)  Wt 144 lb (65.318 kg)  BMI 22.90 kg/m2        Assessment & Plan:   1. CHF   2. COPD   3. GERD (gastroesophageal reflux disease)   4. HYPERLIPIDEMIA   5. Hypothyroidism   6. Vitamin D deficiency   7. Pneumonia   8. COPD (chronic obstructive pulmonary disease) with acute bronchitis   9. Guaiac positive stools   10. Iron deficiency    Orders Placed This Encounter  Procedures  . NMR, lipoprofile  . Thyroid Panel With TSH  . Vit D  25 hydroxy (rtn osteoporosis monitoring)  . Hepatic function panel  . BMP8+EGFR  . POCT CBC   Meds ordered this encounter  Medications  . Pitavastatin Calcium (LIVALO) 4 MG TABS    Sig: Take 1 tablet by mouth daily.  Marland Kitchen albuterol (PROVENTIL) (2.5 MG/3ML) 0.083% nebulizer solution    Sig: Take 3 mLs (2.5 mg total) by nebulization every 6 (six) hours as needed for wheezing.    Dispense:  75 mL    Refill:  12   Patient Instructions  Continue current medications. Continue good therapeutic lifestyle changes.  Fall precautions discussed with patient. Follow up as planned and earlier as needed.   Try to drink plenty of fluids Keep the house cooler during the winter Continue to use Mucinex maximum strength one twice daily with a large glass of water    Nyra Capes MD

## 2013-01-20 NOTE — Patient Instructions (Addendum)
Continue current medications. Continue good therapeutic lifestyle changes.  Fall precautions discussed with patient. Follow up as planned and earlier as needed.  Try to drink plenty of fluids Keep the house cooler during the winter Continue to use Mucinex maximum strength one twice daily with a large glass of water

## 2013-01-21 NOTE — Telephone Encounter (Signed)
Med list changed 

## 2013-01-22 LAB — NMR, LIPOPROFILE
Cholesterol: 186 mg/dL (ref <200)
HDL Cholesterol by NMR: 41 mg/dL (ref >=40)
LDL Particle Number: 1406 nmol/L — ABNORMAL HIGH (ref <1000)
LDLC SERPL CALC-MCNC: 117 mg/dL — ABNORMAL HIGH (ref <100)
Triglycerides by NMR: 138 mg/dL (ref <150)

## 2013-01-22 LAB — BMP8+EGFR
BUN/Creatinine Ratio: 21 (ref 11–26)
BUN: 15 mg/dL (ref 8–27)
CO2: 32 mmol/L — ABNORMAL HIGH (ref 18–29)
Creatinine, Ser: 0.72 mg/dL (ref 0.57–1.00)
GFR calc Af Amer: 92 mL/min/{1.73_m2} (ref >59)
GFR calc non Af Amer: 80 mL/min/{1.73_m2} (ref >59)
Potassium: 4.1 mmol/L (ref 3.5–5.2)
Sodium: 145 mmol/L — ABNORMAL HIGH (ref 134–144)

## 2013-01-22 LAB — VITAMIN D 25 HYDROXY (VIT D DEFICIENCY, FRACTURES): Vit D, 25-Hydroxy: 64.2 ng/mL (ref 30.0–100.0)

## 2013-01-22 LAB — HEPATIC FUNCTION PANEL
AST: 12 IU/L (ref 0–40)
Albumin: 4.1 g/dL (ref 3.5–4.8)
Total Bilirubin: 0.4 mg/dL (ref 0.0–1.2)
Total Protein: 6.5 g/dL (ref 6.0–8.5)

## 2013-01-22 LAB — THYROID PANEL WITH TSH: Free Thyroxine Index: 3.1 (ref 1.2–4.9)

## 2013-01-23 ENCOUNTER — Ambulatory Visit (INDEPENDENT_AMBULATORY_CARE_PROVIDER_SITE_OTHER): Payer: Medicare Other | Admitting: *Deleted

## 2013-01-23 DIAGNOSIS — E559 Vitamin D deficiency, unspecified: Secondary | ICD-10-CM

## 2013-01-23 DIAGNOSIS — D509 Iron deficiency anemia, unspecified: Secondary | ICD-10-CM

## 2013-01-23 DIAGNOSIS — E538 Deficiency of other specified B group vitamins: Secondary | ICD-10-CM

## 2013-01-23 NOTE — Progress Notes (Signed)
Vitamin b12 injection given and tolerated well.  

## 2013-01-23 NOTE — Patient Instructions (Signed)
Vitamin B12 Injections Every person needs vitamin B12. A deficiency develops when the body does not get enough of it. One way to overcome this is by getting B12 shots (injections). A B12 shot puts the vitamin directly into muscle tissue. This avoids any problems your body might have in absorbing it from food or a pill. In some people, the body has trouble using the vitamin correctly. This can cause a B12 deficiency. Not consuming enough of the vitamin can also cause a deficiency. Getting enough vitamin B12 can be hard for elderly people. Sometimes, they do not eat a well-balanced diet. The elderly are also more likely than younger people to have medical conditions or take medications that can lead to a deficiency. WHAT DOES VITAMIN B12 DO? Vitamin B12 does many things to help the body work right:  It helps the body make healthy red blood cells.  It helps maintain nerve cells.  It is involved in the body's process of converting food into energy (metabolism).  It is needed to make the genetic material in all cells (DNA). VITAMIN B12 FOOD SOURCES Most people get plenty of vitamin B12 through the foods they eat. It is present in:  Meat, fish, poultry, and eggs.  Milk and milk products.  It also is added when certain foods are made, including some breads, cereals and yogurts. The food is then called "fortified". CAUSES The most common causes of vitamin B12 deficiency are:  Pernicious anemia. The condition develops when the body cannot make enough healthy red blood cells. This stems from a lack of a protein made in the stomach (intrinsic factor). People without this protein cannot absorb enough vitamin B12 from food.  Malabsorption. This is when the body cannot absorb the vitamin. It can be caused by:  Pernicious anemia.  Surgery to remove part or all of the stomach can lead to malabsorption. Removal of part or all of the small intestine can also cause malabsorption.  Vegetarian diet.  People who are strict about not eating foods from animals could have trouble taking in enough vitamin B12 from diet alone.  Medications. Some medicines have been linked to B12 deficiency, such as Metformin (a drug prescribed for type 2 diabetes). Long-term use of stomach acid suppressants also can keep the vitamin from being absorbed.  Intestinal problems such as inflammatory bowel disease. If there are problems in the digestive tract, vitamin B12 may not be absorbed in good enough amounts. SYMPTOMS People who do not get enough B12 can develop problems. These can include:  Anemia. This is when the body has too few red blood cells. Red blood cells carry oxygen to the rest of the body. Without a healthy supply of red blood cells, people can feel:  Tired (fatigued).  Weak.  Severe anemia can cause:  Shortness of breath.  Dizziness.  Rapid heart rate.  Paleness.  Other Vitamin B12 deficiency symptoms include:  Diarrhea.  Numbness or tingling in the hands or feet.  Loss of appetite.  Confusion.  Sores on the tongue or in the mouth. LET YOUR CAREGIVER KNOW ABOUT:  Any allergies. It is very important to know if you are allergic or sensitive to cobalt. Vitamin B12 contains cobalt.  Any history of kidney disease.  All medications you are taking. Include prescription and over-the-counter medicines, herbs and creams.  Whether you are pregnant or breast-feeding.  If you have Leber's disease, a hereditary eye condition, vitamin B12 could make it worse. RISKS AND COMPLICATIONS Reactions to an injection are   usually temporary. They might include:  Pain at the injection site.  Redness, swelling or tenderness at the site.  Headache, dizziness or weakness.  Nausea, upset stomach or diarrhea.  Numbness or tingling.  Fever.  Joint pain.  Itching or rash. If a reaction does not go away in a short while, talk with your healthcare provider. A change in the way the shots are  given, or where they are given, might need to be made. BEFORE AN INJECTION To decide whether B12 injections are right for you, your healthcare provider will probably:  Ask about your medical history.  Ask questions about your diet.  Ask about symptoms such as:  Have you felt weak?  Do you feel unusually tired?  Do you get dizzy?  Order blood tests. These may include a test to:  Check the level of red cells in your blood.  Measure B12 levels.  Check for the presence of intrinsic factor. VITAMIN B12 INJECTIONS How often you will need a vitamin B12 injection will depend on how severe your deficiency is. This also will affect how long you will need to get them. People with pernicious anemia usually get injections for their entire life. Others might get them for a shorter period. For many people, injections are given daily or weekly for several weeks. Then, once B12 levels are normal, injections are given just once a month. If the cause of the deficiency can be fixed, the injections can be stopped. Talk with your healthcare provider about what you should expect. For an injection:  The injection site will be cleaned with an alcohol swab.  Your healthcare provider will insert a needle directly into a muscle. Most any muscle can be used. Most often, an arm muscle is used. A buttocks muscle can also be used. Many people say shots in that area are less painful.  A small adhesive bandage may be put over the injection site. It usually can be taken off in an hour or less. Injections can be given by your healthcare provider. In some cases, family members give them. Sometimes, people give them to themselves. Talk with your healthcare provider about what would be best for you. If someone other than your healthcare provider will be giving the shots, the person will need to be trained to give them correctly. HOME CARE INSTRUCTIONS   You can remove the adhesive bandage within an hour of getting a  shot.  You should be able to go about your normal activities right away.  Avoid drinking large amounts of alcohol while taking vitamin B12 shots. Alcohol can interfere with the body's use of the vitamin. SEEK MEDICAL CARE IF:   Pain, redness, swelling or tenderness at the injection site does not get better or gets worse.  Headache, dizziness or weakness does not go away.  You develop a fever of more than 100.5 F (38.1 C). SEEK IMMEDIATE MEDICAL CARE IF:   You have chest pain.  You develop shortness of breath.  You have muscle weakness that gets worse.  You develop numbness, weakness or tingling on one side or one area of the body.  You have symptoms of an allergic reaction, such as:  Hives.  Difficulty breathing.  Swelling of the lips, face, tongue or throat.  You develop a fever of more than 102.0 F (38.9 C). MAKE SURE YOU:   Understand these instructions.  Will watch your condition.  Will get help right away if you are not doing well or get worse. Document   Released: 06/01/2008 Document Revised: 05/28/2011 Document Reviewed: 06/01/2008 Exit[employer] Patient Information 2014 [employer].  

## 2013-02-23 ENCOUNTER — Ambulatory Visit (INDEPENDENT_AMBULATORY_CARE_PROVIDER_SITE_OTHER): Payer: Medicare Other | Admitting: *Deleted

## 2013-02-23 DIAGNOSIS — D509 Iron deficiency anemia, unspecified: Secondary | ICD-10-CM

## 2013-02-23 DIAGNOSIS — E538 Deficiency of other specified B group vitamins: Secondary | ICD-10-CM

## 2013-02-23 NOTE — Patient Instructions (Signed)
Vitamin B12 Injections Every person needs vitamin B12. A deficiency develops when the body does not get enough of it. One way to overcome this is by getting B12 shots (injections). A B12 shot puts the vitamin directly into muscle tissue. This avoids any problems your body might have in absorbing it from food or a pill. In some people, the body has trouble using the vitamin correctly. This can cause a B12 deficiency. Not consuming enough of the vitamin can also cause a deficiency. Getting enough vitamin B12 can be hard for elderly people. Sometimes, they do not eat a well-balanced diet. The elderly are also more likely than younger people to have medical conditions or take medications that can lead to a deficiency. WHAT DOES VITAMIN B12 DO? Vitamin B12 does many things to help the body work right:  It helps the body make healthy red blood cells.  It helps maintain nerve cells.  It is involved in the body's process of converting food into energy (metabolism).  It is needed to make the genetic material in all cells (DNA). VITAMIN B12 FOOD SOURCES Most people get plenty of vitamin B12 through the foods they eat. It is present in:  Meat, fish, poultry, and eggs.  Milk and milk products.  It also is added when certain foods are made, including some breads, cereals and yogurts. The food is then called "fortified". CAUSES The most common causes of vitamin B12 deficiency are:  Pernicious anemia. The condition develops when the body cannot make enough healthy red blood cells. This stems from a lack of a protein made in the stomach (intrinsic factor). People without this protein cannot absorb enough vitamin B12 from food.  Malabsorption. This is when the body cannot absorb the vitamin. It can be caused by:  Pernicious anemia.  Surgery to remove part or all of the stomach can lead to malabsorption. Removal of part or all of the small intestine can also cause malabsorption.  Vegetarian diet.  People who are strict about not eating foods from animals could have trouble taking in enough vitamin B12 from diet alone.  Medications. Some medicines have been linked to B12 deficiency, such as Metformin (a drug prescribed for type 2 diabetes). Long-term use of stomach acid suppressants also can keep the vitamin from being absorbed.  Intestinal problems such as inflammatory bowel disease. If there are problems in the digestive tract, vitamin B12 may not be absorbed in good enough amounts. SYMPTOMS People who do not get enough B12 can develop problems. These can include:  Anemia. This is when the body has too few red blood cells. Red blood cells carry oxygen to the rest of the body. Without a healthy supply of red blood cells, people can feel:  Tired (fatigued).  Weak.  Severe anemia can cause:  Shortness of breath.  Dizziness.  Rapid heart rate.  Paleness.  Other Vitamin B12 deficiency symptoms include:  Diarrhea.  Numbness or tingling in the hands or feet.  Loss of appetite.  Confusion.  Sores on the tongue or in the mouth. LET YOUR CAREGIVER KNOW ABOUT:  Any allergies. It is very important to know if you are allergic or sensitive to cobalt. Vitamin B12 contains cobalt.  Any history of kidney disease.  All medications you are taking. Include prescription and over-the-counter medicines, herbs and creams.  Whether you are pregnant or breast-feeding.  If you have Leber's disease, a hereditary eye condition, vitamin B12 could make it worse. RISKS AND COMPLICATIONS Reactions to an injection are   usually temporary. They might include:  Pain at the injection site.  Redness, swelling or tenderness at the site.  Headache, dizziness or weakness.  Nausea, upset stomach or diarrhea.  Numbness or tingling.  Fever.  Joint pain.  Itching or rash. If a reaction does not go away in a short while, talk with your healthcare provider. A change in the way the shots are  given, or where they are given, might need to be made. BEFORE AN INJECTION To decide whether B12 injections are right for you, your healthcare provider will probably:  Ask about your medical history.  Ask questions about your diet.  Ask about symptoms such as:  Have you felt weak?  Do you feel unusually tired?  Do you get dizzy?  Order blood tests. These may include a test to:  Check the level of red cells in your blood.  Measure B12 levels.  Check for the presence of intrinsic factor. VITAMIN B12 INJECTIONS How often you will need a vitamin B12 injection will depend on how severe your deficiency is. This also will affect how long you will need to get them. People with pernicious anemia usually get injections for their entire life. Others might get them for a shorter period. For many people, injections are given daily or weekly for several weeks. Then, once B12 levels are normal, injections are given just once a month. If the cause of the deficiency can be fixed, the injections can be stopped. Talk with your healthcare provider about what you should expect. For an injection:  The injection site will be cleaned with an alcohol swab.  Your healthcare provider will insert a needle directly into a muscle. Most any muscle can be used. Most often, an arm muscle is used. A buttocks muscle can also be used. Many people say shots in that area are less painful.  A small adhesive bandage may be put over the injection site. It usually can be taken off in an hour or less. Injections can be given by your healthcare provider. In some cases, family members give them. Sometimes, people give them to themselves. Talk with your healthcare provider about what would be best for you. If someone other than your healthcare provider will be giving the shots, the person will need to be trained to give them correctly. HOME CARE INSTRUCTIONS   You can remove the adhesive bandage within an hour of getting a  shot.  You should be able to go about your normal activities right away.  Avoid drinking large amounts of alcohol while taking vitamin B12 shots. Alcohol can interfere with the body's use of the vitamin. SEEK MEDICAL CARE IF:   Pain, redness, swelling or tenderness at the injection site does not get better or gets worse.  Headache, dizziness or weakness does not go away.  You develop a fever of more than 100.5 F (38.1 C). SEEK IMMEDIATE MEDICAL CARE IF:   You have chest pain.  You develop shortness of breath.  You have muscle weakness that gets worse.  You develop numbness, weakness or tingling on one side or one area of the body.  You have symptoms of an allergic reaction, such as:  Hives.  Difficulty breathing.  Swelling of the lips, face, tongue or throat.  You develop a fever of more than 102.0 F (38.9 C). MAKE SURE YOU:   Understand these instructions.  Will watch your condition.  Will get help right away if you are not doing well or get worse. Document   Released: 06/01/2008 Document Revised: 05/28/2011 Document Reviewed: 06/01/2008 Exit[employer] Patient Information 2014 [employer].  

## 2013-02-23 NOTE — Progress Notes (Signed)
Vitamin b12 injection given and tolerated well.  

## 2013-03-27 ENCOUNTER — Ambulatory Visit (INDEPENDENT_AMBULATORY_CARE_PROVIDER_SITE_OTHER): Payer: Medicare Other | Admitting: *Deleted

## 2013-03-27 DIAGNOSIS — E538 Deficiency of other specified B group vitamins: Secondary | ICD-10-CM

## 2013-03-27 DIAGNOSIS — D509 Iron deficiency anemia, unspecified: Secondary | ICD-10-CM

## 2013-03-27 NOTE — Progress Notes (Signed)
[name], female   DOB: Apr 17, 1931, 80 y.o.   MRN: [id number] Pt tolerated inj well

## 2013-04-20 ENCOUNTER — Other Ambulatory Visit: Payer: Self-pay | Admitting: *Deleted

## 2013-04-20 DIAGNOSIS — J440 Chronic obstructive pulmonary disease with acute lower respiratory infection: Secondary | ICD-10-CM

## 2013-04-20 DIAGNOSIS — J189 Pneumonia, unspecified organism: Secondary | ICD-10-CM

## 2013-04-20 DIAGNOSIS — J209 Acute bronchitis, unspecified: Secondary | ICD-10-CM

## 2013-04-20 MED ORDER — ALBUTEROL SULFATE (2.5 MG/3ML) 0.083% IN NEBU: 2.5000 mg | INHALATION_SOLUTION | Freq: Four times a day (QID) | RESPIRATORY_TRACT | Status: DC | PRN

## 2013-04-28 ENCOUNTER — Ambulatory Visit (INDEPENDENT_AMBULATORY_CARE_PROVIDER_SITE_OTHER): Payer: Medicare Other | Admitting: *Deleted

## 2013-04-28 DIAGNOSIS — E538 Deficiency of other specified B group vitamins: Secondary | ICD-10-CM

## 2013-04-28 DIAGNOSIS — D509 Iron deficiency anemia, unspecified: Secondary | ICD-10-CM

## 2013-05-25 ENCOUNTER — Encounter: Payer: Self-pay | Admitting: Family Medicine

## 2013-05-25 ENCOUNTER — Ambulatory Visit (INDEPENDENT_AMBULATORY_CARE_PROVIDER_SITE_OTHER): Payer: Medicare Other | Admitting: Family Medicine

## 2013-05-25 VITALS — BP 127/64 | HR 60 | Temp 98.7°F | Ht 66.5 in | Wt 143.0 lb

## 2013-05-25 DIAGNOSIS — K219 Gastro-esophageal reflux disease without esophagitis: Secondary | ICD-10-CM

## 2013-05-25 DIAGNOSIS — D049 Carcinoma in situ of skin, unspecified: Secondary | ICD-10-CM

## 2013-05-25 DIAGNOSIS — M129 Arthropathy, unspecified: Secondary | ICD-10-CM

## 2013-05-25 DIAGNOSIS — J209 Acute bronchitis, unspecified: Secondary | ICD-10-CM

## 2013-05-25 DIAGNOSIS — E559 Vitamin D deficiency, unspecified: Secondary | ICD-10-CM

## 2013-05-25 DIAGNOSIS — J449 Chronic obstructive pulmonary disease, unspecified: Secondary | ICD-10-CM

## 2013-05-25 DIAGNOSIS — E785 Hyperlipidemia, unspecified: Secondary | ICD-10-CM

## 2013-05-25 DIAGNOSIS — E039 Hypothyroidism, unspecified: Secondary | ICD-10-CM

## 2013-05-25 DIAGNOSIS — Z780 Asymptomatic menopausal state: Secondary | ICD-10-CM

## 2013-05-25 DIAGNOSIS — J189 Pneumonia, unspecified organism: Secondary | ICD-10-CM

## 2013-05-25 DIAGNOSIS — J440 Chronic obstructive pulmonary disease with acute lower respiratory infection: Secondary | ICD-10-CM

## 2013-05-25 DIAGNOSIS — I509 Heart failure, unspecified: Secondary | ICD-10-CM

## 2013-05-25 LAB — POCT CBC
GRANULOCYTE PERCENT: 73.6 % (ref 37–80)
HEMATOCRIT: 45.6 % (ref 37.7–47.9)
Hemoglobin: 14.5 g/dL (ref 12.2–16.2)
LYMPH, POC: 2 (ref 0.6–3.4)
MCH, POC: 26 pg — AB (ref 27–31.2)
MCHC: 31.8 g/dL (ref 31.8–35.4)
MCV: 81.8 fL (ref 80–97)
MPV: 8.4 fL (ref 0–99.8)
PLATELET COUNT, POC: 179 10*3/uL (ref 142–424)
POC Granulocyte: 6.6 (ref 2–6.9)
POC LYMPH PERCENT: 22.4 %L (ref 10–50)
RBC: 5.6 M/uL — AB (ref 4.04–5.48)
RDW, POC: 15.7 %
WBC: 9 10*3/uL (ref 4.6–10.2)

## 2013-05-25 MED ORDER — ALBUTEROL SULFATE (2.5 MG/3ML) 0.083% IN NEBU: 2.5000 mg | INHALATION_SOLUTION | Freq: Four times a day (QID) | RESPIRATORY_TRACT | Status: DC | PRN

## 2013-05-25 NOTE — Progress Notes (Signed)
Subjective:    [name], female    DOB: 10/05/1931, 80 y.o.   MRN: [id number]  HPI Pt here for follow up and management of chronic medical problems. The patient comes to the visit today with her daughter. She is somewhat upset regarding the recent recurrence of lung cancer and her husband.        Patient Active Problem List   Diagnosis Date Noted  . Vitamin D deficiency   . Iron deficiency 08/22/2010  . GERD (gastroesophageal reflux disease) 08/22/2010  . Guaiac positive stools 08/22/2010  . BOWEN'S DISEASE 12/24/2008  . Hypothyroidism 12/24/2008  . HEMORRHOIDS 12/24/2008  . RECTAL BLEEDING 12/24/2008  . ARTHRITIS 12/24/2008  . NEOPLASM, MALIGNANT, ANAL CANAL, SQUAMOUS CELL 12/23/2008  . HYPERLIPIDEMIA 12/23/2008  . CARDIOMYOPATHY 12/23/2008  . CHF 12/23/2008  . COPD 12/23/2008  . HIATAL HERNIA 12/23/2008  . DIVERTICULOSIS, COLON 12/23/2008  . RENAL CYST 12/23/2008   Outpatient Encounter Prescriptions as of 05/25/2013  Medication Sig  . albuterol (PROVENTIL HFA;VENTOLIN HFA) 108 (90 BASE) MCG/ACT inhaler Inhale 1 puff into the lungs as needed.  Marland Kitchen albuterol (PROVENTIL) (2.5 MG/3ML) 0.083% nebulizer solution Take 3 mLs (2.5 mg total) by nebulization every 6 (six) hours as needed for wheezing.  . budesonide (PULMICORT) 0.5 MG/2ML nebulizer solution Take 2 mLs (0.5 mg total) by nebulization 2 (two) times daily.  . Cholecalciferol (VITAMIN D3) 1000 UNITS tablet Take 2,000 Units by mouth daily.   . cyanocobalamin (,VITAMIN B-12,) 1000 MCG/ML injection Inject 1,000 mcg into the muscle every 30 (thirty) days.   Marland Kitchen esomeprazole (NEXIUM) 40 MG capsule Take 40 mg by mouth daily before breakfast.    . Fluticasone-Salmeterol (ADVAIR DISKUS) 250-50 MCG/DOSE AEPB Inhale 1 puff into the lungs every 12 (twelve) hours.    . hydrochlorothiazide (HYDRODIURIL) 25 MG tablet TAKE ONE TABLET BY MOUTH EVERY DAY  . levothyroxine (SYNTHROID, LEVOTHROID) 112 MCG tablet Take 112 mcg  by mouth daily.  Marland Kitchen omega-3 acid ethyl esters (LOVAZA) 1 G capsule Take 1 g by mouth daily.   . Pitavastatin Calcium (LIVALO) 4 MG TABS Take 0.5 tablets by mouth daily.     Review of Systems  Constitutional: Negative.   HENT: Negative.   Eyes: Negative.   Respiratory: Negative.   Cardiovascular: Negative.   Gastrointestinal: Negative.   Endocrine: Negative.   Genitourinary: Negative.   Musculoskeletal: Negative.   Skin: Negative.   Allergic/Immunologic: Negative.   Neurological: Negative.   Hematological: Negative.   Psychiatric/Behavioral: Negative.        Objective:   Physical Exam  Nursing note and vitals reviewed. Constitutional: She is oriented to person, place, and time. She appears well-developed and well-nourished. No distress.  Agent with her daughter, was calm and pleasant despite worrying about her husband. She appears somewhat older than her stated age.  HENT:  Right Ear: External ear normal.  Left Ear: External ear normal.  Nose: Nose normal.  Mouth/Throat: Oropharynx is clear and moist.  Eyes: Conjunctivae and EOM are normal. Pupils are equal, round, and reactive to light. Right eye exhibits no discharge. Left eye exhibits no discharge. No scleral icterus.  Neck: Normal range of motion. Neck supple. No JVD present. No thyromegaly present.  There is a left supraclavicular bruit.  Cardiovascular: Normal rate, regular rhythm and normal heart sounds.  Exam reveals no gallop and no friction rub.   No murmur heard. At 72 per minute.  Pulmonary/Chest: Effort normal. No respiratory distress. She has wheezes. She has no  rales. She exhibits no tenderness.  There were wheezes, rhonchi and diminished breath sounds bilaterally compared and  similar to previous auscultation  Abdominal: Soft. Bowel sounds are normal. She exhibits no mass. There is no tenderness. There is no rebound and no guarding.  Genitourinary:  The breasts were checked today there were no lumps or masses.  There is no axillary adenopathy.  Musculoskeletal: Normal range of motion. She exhibits no edema and no tenderness.  Lymphadenopathy:    She has no cervical adenopathy.  Neurological: She is alert and oriented to person, place, and time. She has normal reflexes. No cranial nerve deficit.  Skin: Skin is warm and dry.  Psychiatric: She has a normal mood and affect. Her behavior is normal. Judgment and thought content normal.   BP 127/64  Pulse 60  Temp(Src) 98.7 F (37.1 C) (Oral)  Ht 5' 6.5" (1.689 m)  Wt 143 lb (64.864 kg)  BMI 22.74 kg/m2        Assessment & Plan:  1. ARTHRITIS - POCT CBC - DG Bone Density; Future  2. CHF - POCT CBC - BMP8+EGFR - Hepatic function panel  3. COPD - POCT CBC  4. Hypothyroidism - POCT CBC  5. HYPERLIPIDEMIA - POCT CBC - Hepatic function panel - NMR, lipoprofile  6. Vitamin D deficiency - POCT CBC - Vit D  25 hydroxy (rtn osteoporosis monitoring)  7. GERD (gastroesophageal reflux disease)  8. BOWEN'S DISEASE  9. Postmenopausal - DG Bone Density; Future  10. Pneumonia, history of - albuterol (PROVENTIL) (2.5 MG/3ML) 0.083% nebulizer solution; Take 3 mLs (2.5 mg total) by nebulization every 6 (six) hours as needed for wheezing.  Dispense: 225 mL; Refill: 3  11. COPD (chronic obstructive pulmonary disease) with acute bronchitis -Stable - albuterol (PROVENTIL) (2.5 MG/3ML) 0.083% nebulizer solution; Take 3 mLs (2.5 mg total) by nebulization every 6 (six) hours as needed for wheezing.  Dispense: 225 mL; Refill: 3  Meds ordered this encounter  Medications  . albuterol (PROVENTIL) (2.5 MG/3ML) 0.083% nebulizer solution    Sig: Take 3 mLs (2.5 mg total) by nebulization every 6 (six) hours as needed for wheezing.    Dispense:  225 mL    Refill:  3   Patient Instructions                       Medicare Annual Wellness Visit  [facility] and the medical providers at [facility] strive to bring you the best  medical care.  In doing so we not only want to address your current medical conditions and concerns but also to detect new conditions early and prevent illness, disease and health-related problems.    Medicare offers a yearly Wellness Visit which allows our clinical staff to assess your need for preventative services including immunizations, lifestyle education, counseling to decrease risk of preventable diseases and screening for fall risk and other medical concerns.    This visit is provided free of charge (no copay) for all Medicare recipients. The clinical pharmacists at [facility] have begun to conduct these Wellness Visits which will also include a thorough review of all your medications.    As you primary medical provider recommend that you make an appointment for your Annual Wellness Visit if you have not done so already this year.  You may set up this appointment before you leave today or you may call back ([phone]) and schedule an appointment.  Please make sure when you call  that you mention that you are scheduling your Annual Wellness Visit with the clinical pharmacist so that the appointment may be made for the proper length of time.     Continue current medications. Continue good therapeutic lifestyle changes which include good diet and exercise. Fall precautions discussed with patient. If an FOBT was given today- please return it to our front desk. If you are over 71 years old - you may need Prevnar 70 or the adult Pneumonia vaccine.     Arrie Senate MD

## 2013-05-25 NOTE — Patient Instructions (Signed)
Medicare Annual Wellness Visit  [facility] and the medical providers at [facility] strive to bring you the best medical care.  In doing so we not only want to address your current medical conditions and concerns but also to detect new conditions early and prevent illness, disease and health-related problems.    Medicare offers a yearly Wellness Visit which allows our clinical staff to assess your need for preventative services including immunizations, lifestyle education, counseling to decrease risk of preventable diseases and screening for fall risk and other medical concerns.    This visit is provided free of charge (no copay) for all Medicare recipients. The clinical pharmacists at [facility] have begun to conduct these Wellness Visits which will also include a thorough review of all your medications.    As you primary medical provider recommend that you make an appointment for your Annual Wellness Visit if you have not done so already this year.  You may set up this appointment before you leave today or you may call back ([phone]) and schedule an appointment.  Please make sure when you call that you mention that you are scheduling your Annual Wellness Visit with the clinical pharmacist so that the appointment may be made for the proper length of time.      Continue current medications. Continue good therapeutic lifestyle changes which include good diet and exercise. Fall precautions discussed with patient. If an FOBT was given today- please return it to our front desk. If you are over 50 years old - you may need Prevnar 13 or the adult Pneumonia vaccine.   

## 2013-05-27 ENCOUNTER — Other Ambulatory Visit: Payer: Medicare Other

## 2013-05-27 ENCOUNTER — Ambulatory Visit (INDEPENDENT_AMBULATORY_CARE_PROVIDER_SITE_OTHER): Payer: Medicare Other | Admitting: *Deleted

## 2013-05-27 DIAGNOSIS — Z1212 Encounter for screening for malignant neoplasm of rectum: Secondary | ICD-10-CM

## 2013-05-27 DIAGNOSIS — D509 Iron deficiency anemia, unspecified: Secondary | ICD-10-CM

## 2013-05-27 DIAGNOSIS — E538 Deficiency of other specified B group vitamins: Secondary | ICD-10-CM

## 2013-05-27 LAB — BMP8+EGFR
BUN / CREAT RATIO: 18 (ref 11–26)
BUN: 12 mg/dL (ref 8–27)
CO2: 32 mmol/L — AB (ref 18–29)
CREATININE: 0.68 mg/dL (ref 0.57–1.00)
Calcium: 9.4 mg/dL (ref 8.7–10.3)
Chloride: 98 mmol/L (ref 97–108)
GFR calc Af Amer: 96 mL/min/{1.73_m2} (ref >59)
GFR calc non Af Amer: 83 mL/min/{1.73_m2} (ref >59)
Glucose: 86 mg/dL (ref 65–99)
Potassium: 3.3 mmol/L — ABNORMAL LOW (ref 3.5–5.2)
Sodium: 142 mmol/L (ref 134–144)

## 2013-05-27 LAB — NMR, LIPOPROFILE
Cholesterol: 167 mg/dL (ref <200)
HDL Cholesterol by NMR: 43 mg/dL (ref >=40)
HDL PARTICLE NUMBER: 24.2 umol/L — AB (ref >=30.5)
LDL Particle Number: 1540 nmol/L — ABNORMAL HIGH (ref <1000)
LDL Size: 20.5 nm — ABNORMAL LOW (ref >20.5)
LDLC SERPL CALC-MCNC: 97 mg/dL (ref <100)
LP-IR Score: 32 (ref <=45)
SMALL LDL PARTICLE NUMBER: 837 nmol/L — AB (ref <=527)
TRIGLYCERIDES BY NMR: 133 mg/dL (ref <150)

## 2013-05-27 LAB — HEPATIC FUNCTION PANEL
ALT: 5 IU/L (ref 0–32)
AST: 12 IU/L (ref 0–40)
Albumin: 3.9 g/dL (ref 3.5–4.8)
Alkaline Phosphatase: 81 IU/L (ref 39–117)
Bilirubin, Direct: 0.12 mg/dL (ref 0.00–0.40)
Total Bilirubin: 0.5 mg/dL (ref 0.0–1.2)
Total Protein: 6.2 g/dL (ref 6.0–8.5)

## 2013-05-27 LAB — VITAMIN D 25 HYDROXY (VIT D DEFICIENCY, FRACTURES): VIT D 25 HYDROXY: 50.9 ng/mL (ref 30.0–100.0)

## 2013-05-27 NOTE — Patient Instructions (Signed)
Vitamin B12 Injections Every person needs vitamin B12. A deficiency develops when the body does not get enough of it. One way to overcome this is by getting B12 shots (injections). A B12 shot puts the vitamin directly into muscle tissue. This avoids any problems your body might have in absorbing it from food or a pill. In some people, the body has trouble using the vitamin correctly. This can cause a B12 deficiency. Not consuming enough of the vitamin can also cause a deficiency. Getting enough vitamin B12 can be hard for elderly people. Sometimes, they do not eat a well-balanced diet. The elderly are also more likely than younger people to have medical conditions or take medications that can lead to a deficiency. WHAT DOES VITAMIN B12 DO? Vitamin B12 does many things to help the body work right:  It helps the body make healthy red blood cells.  It helps maintain nerve cells.  It is involved in the body's process of converting food into energy (metabolism).  It is needed to make the genetic material in all cells (DNA). VITAMIN B12 FOOD SOURCES Most people get plenty of vitamin B12 through the foods they eat. It is present in:  Meat, fish, poultry, and eggs.  Milk and milk products.  It also is added when certain foods are made, including some breads, cereals and yogurts. The food is then called "fortified". CAUSES The most common causes of vitamin B12 deficiency are:  Pernicious anemia. The condition develops when the body cannot make enough healthy red blood cells. This stems from a lack of a protein made in the stomach (intrinsic factor). People without this protein cannot absorb enough vitamin B12 from food.  Malabsorption. This is when the body cannot absorb the vitamin. It can be caused by:  Pernicious anemia.  Surgery to remove part or all of the stomach can lead to malabsorption. Removal of part or all of the small intestine can also cause malabsorption.  Vegetarian diet.  People who are strict about not eating foods from animals could have trouble taking in enough vitamin B12 from diet alone.  Medications. Some medicines have been linked to B12 deficiency, such as Metformin (a drug prescribed for type 2 diabetes). Long-term use of stomach acid suppressants also can keep the vitamin from being absorbed.  Intestinal problems such as inflammatory bowel disease. If there are problems in the digestive tract, vitamin B12 may not be absorbed in good enough amounts. SYMPTOMS People who do not get enough B12 can develop problems. These can include:  Anemia. This is when the body has too few red blood cells. Red blood cells carry oxygen to the rest of the body. Without a healthy supply of red blood cells, people can feel:  Tired (fatigued).  Weak.  Severe anemia can cause:  Shortness of breath.  Dizziness.  Rapid heart rate.  Paleness.  Other Vitamin B12 deficiency symptoms include:  Diarrhea.  Numbness or tingling in the hands or feet.  Loss of appetite.  Confusion.  Sores on the tongue or in the mouth. LET YOUR CAREGIVER KNOW ABOUT:  Any allergies. It is very important to know if you are allergic or sensitive to cobalt. Vitamin B12 contains cobalt.  Any history of kidney disease.  All medications you are taking. Include prescription and over-the-counter medicines, herbs and creams.  Whether you are pregnant or breast-feeding.  If you have Leber's disease, a hereditary eye condition, vitamin B12 could make it worse. RISKS AND COMPLICATIONS Reactions to an injection are   usually temporary. They might include:  Pain at the injection site.  Redness, swelling or tenderness at the site.  Headache, dizziness or weakness.  Nausea, upset stomach or diarrhea.  Numbness or tingling.  Fever.  Joint pain.  Itching or rash. If a reaction does not go away in a short while, talk with your healthcare provider. A change in the way the shots are  given, or where they are given, might need to be made. BEFORE AN INJECTION To decide whether B12 injections are right for you, your healthcare provider will probably:  Ask about your medical history.  Ask questions about your diet.  Ask about symptoms such as:  Have you felt weak?  Do you feel unusually tired?  Do you get dizzy?  Order blood tests. These may include a test to:  Check the level of red cells in your blood.  Measure B12 levels.  Check for the presence of intrinsic factor. VITAMIN B12 INJECTIONS How often you will need a vitamin B12 injection will depend on how severe your deficiency is. This also will affect how long you will need to get them. People with pernicious anemia usually get injections for their entire life. Others might get them for a shorter period. For many people, injections are given daily or weekly for several weeks. Then, once B12 levels are normal, injections are given just once a month. If the cause of the deficiency can be fixed, the injections can be stopped. Talk with your healthcare provider about what you should expect. For an injection:  The injection site will be cleaned with an alcohol swab.  Your healthcare provider will insert a needle directly into a muscle. Most any muscle can be used. Most often, an arm muscle is used. A buttocks muscle can also be used. Many people say shots in that area are less painful.  A small adhesive bandage may be put over the injection site. It usually can be taken off in an hour or less. Injections can be given by your healthcare provider. In some cases, family members give them. Sometimes, people give them to themselves. Talk with your healthcare provider about what would be best for you. If someone other than your healthcare provider will be giving the shots, the person will need to be trained to give them correctly. HOME CARE INSTRUCTIONS   You can remove the adhesive bandage within an hour of getting a  shot.  You should be able to go about your normal activities right away.  Avoid drinking large amounts of alcohol while taking vitamin B12 shots. Alcohol can interfere with the body's use of the vitamin. SEEK MEDICAL CARE IF:   Pain, redness, swelling or tenderness at the injection site does not get better or gets worse.  Headache, dizziness or weakness does not go away.  You develop a fever of more than 100.5 F (38.1 C). SEEK IMMEDIATE MEDICAL CARE IF:   You have chest pain.  You develop shortness of breath.  You have muscle weakness that gets worse.  You develop numbness, weakness or tingling on one side or one area of the body.  You have symptoms of an allergic reaction, such as:  Hives.  Difficulty breathing.  Swelling of the lips, face, tongue or throat.  You develop a fever of more than 102.0 F (38.9 C). MAKE SURE YOU:   Understand these instructions.  Will watch your condition.  Will get help right away if you are not doing well or get worse. Document   Released: 06/01/2008 Document Revised: 05/28/2011 Document Reviewed: 06/01/2008 Exit[employer] Patient Information 2014 [employer].  

## 2013-05-27 NOTE — Progress Notes (Signed)
Patient tolerated well.

## 2013-05-27 NOTE — Progress Notes (Signed)
Pt dropped off FOBT only 

## 2013-05-28 LAB — FECAL OCCULT BLOOD, IMMUNOCHEMICAL: Fecal Occult Bld: POSITIVE — AB

## 2013-06-30 ENCOUNTER — Ambulatory Visit (INDEPENDENT_AMBULATORY_CARE_PROVIDER_SITE_OTHER): Payer: Medicare Other | Admitting: *Deleted

## 2013-06-30 DIAGNOSIS — E538 Deficiency of other specified B group vitamins: Secondary | ICD-10-CM

## 2013-07-15 ENCOUNTER — Ambulatory Visit (INDEPENDENT_AMBULATORY_CARE_PROVIDER_SITE_OTHER): Payer: Medicare Other | Admitting: Pharmacist

## 2013-07-15 ENCOUNTER — Encounter: Payer: Self-pay | Admitting: Pharmacist

## 2013-07-15 ENCOUNTER — Ambulatory Visit (INDEPENDENT_AMBULATORY_CARE_PROVIDER_SITE_OTHER): Payer: Medicare Other

## 2013-07-15 VITALS — Ht 66.0 in | Wt 139.0 lb

## 2013-07-15 DIAGNOSIS — Z780 Asymptomatic menopausal state: Secondary | ICD-10-CM

## 2013-07-15 DIAGNOSIS — M129 Arthropathy, unspecified: Secondary | ICD-10-CM

## 2013-07-15 DIAGNOSIS — M810 Age-related osteoporosis without current pathological fracture: Secondary | ICD-10-CM | POA: Insufficient documentation

## 2013-07-15 LAB — HM DEXA SCAN

## 2013-07-15 NOTE — Patient Instructions (Signed)

## 2013-07-15 NOTE — Progress Notes (Signed)
[name], female   DOB: 1932-02-05, 80 y.o.   MRN: [id number] Current Height:        Max Lifetime Height:  5' 7.5" Current Weight:         Ethnicity:Caucasian    HPI: Patient with history of osteoporosis - in past has refused medication therapy and appointments to discuss DEXA results  Back Pain?  Yes       Kyphosis?  No Prior fracture?  No Med(s) for Osteoporosis/Osteopenia:  none Med(s) previously tried for Osteoporosis/Osteopenia:  none                                                             PMH: Age at menopause:  Surgical at 80 yo Hysterectomy?  Yes Oophorectomy?  No HRT? Yes - Former.  Type/duration: premarin but unsure of duration Steroid Use?  Yes - Current.  Type/duration: inhaled steroids Thyroid med?  Yes History of cancer?  No History of digestive disorders (ie Crohn's)?  Yes - GERD  Current or previous eating disorders?  No Last Vitamin D Result:  50.9 (05/25/2013) Last GFR Result:  83 (05/25/2013)   FH/SH: Family history of osteoporosis?  Yes - daughter Parent with history of hip fracture?  No Family history of breast cancer?  No Exercise?  No Smoking?  Yes  - not interested in quitting Alcohol?  No    Calcium Assessment Calcium Intake  # of servings/day  Calcium mg  Milk (8 oz) 0  x  300  = 0  Yogurt (4 oz) 0 x  200 = 0  Cheese (1 oz) 0 x  200 = 0  Other Calcium sources   250mg   Ca supplement 600mg  qd = 600mg    Estimated calcium intake per day 850mg     DEXA Results  Date of Test T-Score for AP Spine L1-L4 T-Score for Total Left Hip T-Score for Total Right Hip  07/15/2013 -3.4 -3.1 -2.6  07/19/2010 -3.3 -2.2 -2.1  02/04/2006 -3.2 -2.6 -2.3  11/29/2003 -2.7 -1.6 --    Assessment: Osteoporosis - history of refusing treatment for osteoporosis Recommendations: 1.  Since Katie Wilkins gets [facility] I have contacted Denyse Amass to find out which  bisphosphonate they might be able to get for her - I would like to try Actonel monthly.  Made called to Becky Sax - has to leave message.  2.  recommend calcium 1200mg  daily through supplementation or diet.  3.  recommend weight bearing exercise - 30 minutes at least 4 days per week.   4.  Counseled and educated about fall risk and prevention. 5.  Discussed benefits of smoking cessation on bone - patient not interested in further smoking cessation counseling today.  Recheck DEXA:  2 years  Time spent counseling patient:  30 minutes  Cherre Robins, PharmD, CPP

## 2013-07-31 ENCOUNTER — Ambulatory Visit (INDEPENDENT_AMBULATORY_CARE_PROVIDER_SITE_OTHER): Payer: Medicare Other | Admitting: *Deleted

## 2013-07-31 DIAGNOSIS — E538 Deficiency of other specified B group vitamins: Secondary | ICD-10-CM

## 2013-07-31 DIAGNOSIS — D509 Iron deficiency anemia, unspecified: Secondary | ICD-10-CM

## 2013-08-19 ENCOUNTER — Telehealth: Payer: Self-pay | Admitting: Pharmacist

## 2013-08-19 NOTE — Telephone Encounter (Signed)
I had contact Denyse Amass with [facility] Patient Assistance to see about coverage for a  Bisphosphonate for this patient.  There is not a bisphosphonate that she could get.  I called [facility] and they would fill alendronate 3 month supply for $25.  Patient declined any treatment for ostoeporosis.  I reminded her about the increase risk of fractures and she verbalized that she understood but did not wish to take another medication at this time.

## 2013-09-01 ENCOUNTER — Ambulatory Visit (INDEPENDENT_AMBULATORY_CARE_PROVIDER_SITE_OTHER): Payer: Medicare Other | Admitting: *Deleted

## 2013-09-01 DIAGNOSIS — E538 Deficiency of other specified B group vitamins: Secondary | ICD-10-CM

## 2013-09-01 DIAGNOSIS — D509 Iron deficiency anemia, unspecified: Secondary | ICD-10-CM

## 2013-09-01 NOTE — Progress Notes (Signed)
[name], female   DOB: Dec 07, 1931, 80 y.o.   MRN: [id number] PT TOLERATED INJ WELL

## 2013-09-03 ENCOUNTER — Telehealth: Payer: Self-pay | Admitting: *Deleted

## 2013-09-03 NOTE — Telephone Encounter (Signed)
Per [facility], rx for Ativan 0.5mg  BID, prn, QTY 14, called to [facility].

## 2013-09-21 ENCOUNTER — Ambulatory Visit (INDEPENDENT_AMBULATORY_CARE_PROVIDER_SITE_OTHER): Payer: Medicare Other

## 2013-09-21 ENCOUNTER — Ambulatory Visit (INDEPENDENT_AMBULATORY_CARE_PROVIDER_SITE_OTHER): Payer: Medicare Other | Admitting: Family Medicine

## 2013-09-21 ENCOUNTER — Encounter: Payer: Self-pay | Admitting: Family Medicine

## 2013-09-21 VITALS — BP 102/51 | HR 61 | Temp 97.3°F | Ht 66.0 in | Wt 137.8 lb

## 2013-09-21 DIAGNOSIS — E559 Vitamin D deficiency, unspecified: Secondary | ICD-10-CM

## 2013-09-21 DIAGNOSIS — M25559 Pain in unspecified hip: Secondary | ICD-10-CM

## 2013-09-21 DIAGNOSIS — R5383 Other fatigue: Secondary | ICD-10-CM

## 2013-09-21 DIAGNOSIS — E785 Hyperlipidemia, unspecified: Secondary | ICD-10-CM

## 2013-09-21 DIAGNOSIS — I739 Peripheral vascular disease, unspecified: Secondary | ICD-10-CM

## 2013-09-21 DIAGNOSIS — I10 Essential (primary) hypertension: Secondary | ICD-10-CM

## 2013-09-21 DIAGNOSIS — Z23 Encounter for immunization: Secondary | ICD-10-CM

## 2013-09-21 DIAGNOSIS — G629 Polyneuropathy, unspecified: Secondary | ICD-10-CM

## 2013-09-21 DIAGNOSIS — F329 Major depressive disorder, single episode, unspecified: Secondary | ICD-10-CM

## 2013-09-21 DIAGNOSIS — G589 Mononeuropathy, unspecified: Secondary | ICD-10-CM

## 2013-09-21 DIAGNOSIS — F3289 Other specified depressive episodes: Secondary | ICD-10-CM

## 2013-09-21 DIAGNOSIS — F32A Depression, unspecified: Secondary | ICD-10-CM

## 2013-09-21 DIAGNOSIS — R5381 Other malaise: Secondary | ICD-10-CM

## 2013-09-21 LAB — POCT CBC
GRANULOCYTE PERCENT: 69.4 % (ref 37–80)
HCT, POC: 45.9 % (ref 37.7–47.9)
HEMOGLOBIN: 15.1 g/dL (ref 12.2–16.2)
LYMPH, POC: 2.4 (ref 0.6–3.4)
MCH, POC: 26.7 pg — AB (ref 27–31.2)
MCHC: 32.8 g/dL (ref 31.8–35.4)
MCV: 81.3 fL (ref 80–97)
MPV: 8.1 fL (ref 0–99.8)
POC GRANULOCYTE: 6.2 (ref 2–6.9)
POC LYMPH %: 26.8 % (ref 10–50)
Platelet Count, POC: 200 10*3/uL (ref 142–424)
RBC: 5.6 M/uL — AB (ref 4.04–5.48)
RDW, POC: 15.1 %
WBC: 8.9 10*3/uL (ref 4.6–10.2)

## 2013-09-21 NOTE — Progress Notes (Signed)
Subjective:    [name], female    DOB: 1931/09/28, 80 y.o.   MRN: [id number]  HPI Pt here for follow up and management of chronic medical problems which include hypertension, hyperlipidemis, COPD, hypothyroidism and GERD.The patient is somewhat down because of recently losing her husband to lung cancer. She continues to smoke.she comes to the visit today with her daughter. She is due a Pap smear but declines this. She is up-to-date on her mammogram. She will receive a Prevnar vaccine. She is also due to get a chest x-ray in September. The patient complains of hip pain bilaterally worse at nighttime. This is somewhat relieved by taking ibuprofen. This is always worse with laying in the bed at nighttime.   Patient Active Problem List   Diagnosis Date Noted  . Osteoporosis, senile 07/15/2013  . Vitamin D deficiency   . Iron deficiency 08/22/2010  . GERD (gastroesophageal reflux disease) 08/22/2010  . Guaiac positive stools 08/22/2010  . BOWEN'S DISEASE 12/24/2008  . Hypothyroidism 12/24/2008  . HEMORRHOIDS 12/24/2008  . RECTAL BLEEDING 12/24/2008  . ARTHRITIS 12/24/2008  . NEOPLASM, MALIGNANT, ANAL CANAL, SQUAMOUS CELL 12/23/2008  . HYPERLIPIDEMIA 12/23/2008  . CARDIOMYOPATHY 12/23/2008  . CHF 12/23/2008  . COPD 12/23/2008  . HIATAL HERNIA 12/23/2008  . DIVERTICULOSIS, COLON 12/23/2008  . RENAL CYST 12/23/2008   Outpatient Encounter Prescriptions as of 09/21/2013  Medication Sig  . albuterol (PROVENTIL HFA;VENTOLIN HFA) 108 (90 BASE) MCG/ACT inhaler Inhale 1 puff into the lungs as needed.  Marland Kitchen albuterol (PROVENTIL) (2.5 MG/3ML) 0.083% nebulizer solution Take 3 mLs (2.5 mg total) by nebulization every 6 (six) hours as needed for wheezing.  . budesonide (PULMICORT) 0.5 MG/2ML nebulizer solution Take 2 mLs (0.5 mg total) by nebulization 2 (two) times daily.  . Cholecalciferol (VITAMIN D3) 1000 UNITS tablet Take 2,000 Units by mouth daily.   . cyanocobalamin (,VITAMIN  B-12,) 1000 MCG/ML injection Inject 1,000 mcg into the muscle every 30 (thirty) days.   Marland Kitchen esomeprazole (NEXIUM) 40 MG capsule Take 40 mg by mouth daily before breakfast.    . hydrochlorothiazide (HYDRODIURIL) 25 MG tablet TAKE ONE TABLET BY MOUTH EVERY DAY  . levothyroxine (SYNTHROID, LEVOTHROID) 112 MCG tablet Take 112 mcg by mouth daily.  Marland Kitchen omega-3 acid ethyl esters (LOVAZA) 1 G capsule Take 1 g by mouth daily.   . Pitavastatin Calcium (LIVALO) 4 MG TABS Take 0.5 tablets by mouth daily.   . [DISCONTINUED] Fluticasone-Salmeterol (ADVAIR DISKUS) 250-50 MCG/DOSE AEPB Inhale 1 puff into the lungs every 12 (twelve) hours.      Review of Systems  Constitutional: Positive for fatigue (slight).  HENT: Negative.   Eyes: Negative.   Respiratory: Positive for cough.   Cardiovascular: Negative.   Gastrointestinal: Negative.   Endocrine: Negative.   Genitourinary: Negative.   Musculoskeletal: Positive for arthralgias (bilateral hip).  Skin: Negative.   Allergic/Immunologic: Negative.   Hematological: Negative.   Psychiatric/Behavioral: Negative for sleep disturbance.       Objective:   Physical Exam  Nursing note and vitals reviewed. Constitutional: She is oriented to person, place, and time. She appears well-developed and well-nourished. She appears distressed.  Somewhat distressed and tearful secondary to the recent loss of her husband.  HENT:  Head: Normocephalic and atraumatic.  Right Ear: External ear normal.  Left Ear: External ear normal.  Nose: Nose normal.  Mouth/Throat: Oropharynx is clear and moist.  Eyes: Conjunctivae and EOM are normal. Pupils are equal, round, and reactive to light. Right eye exhibits  no discharge. Left eye exhibits no discharge. No scleral icterus.  Neck: Normal range of motion. Neck supple. No JVD present. No thyromegaly present.  Cardiovascular: Normal rate and normal heart sounds.  Exam reveals no gallop and no friction rub.   No murmur heard. The  rhythm is regular except for occasional PVCs. The rate is 60 per minute,the left pedal pulses were difficult to palpate. The left inguinal pulse was difficult to palpate. She does not describe any claudication symptoms.  Pulmonary/Chest: Effort normal. No respiratory distress. She has no wheezes. She has no rales. She exhibits no tenderness.  Diminished breath sounds otherwise no wheezing or increased congestion  Abdominal: Soft. Bowel sounds are normal. She exhibits no mass. There is no tenderness. There is no rebound and no guarding.  Musculoskeletal: Normal range of motion. She exhibits no edema and no tenderness.  Lymphadenopathy:    She has no cervical adenopathy.  Neurological: She is alert and oriented to person, place, and time. No cranial nerve deficit.  The DTRs were decreased bilaterally  Skin: Skin is warm and dry. No rash noted.  Psychiatric: Her behavior is normal. Judgment and thought content normal.  depression   BP 102/51  Pulse 61  Temp(Src) 97.3 F (36.3 C) (Oral)  Ht '5\' 6"'  (1.676 m)  Wt 137 lb 12.8 oz (62.506 kg)  BMI 22.25 kg/m2  [facility] reading (PRIMARY) by  Dr. Charlesetta Garibaldi hips and LS spine-- osteopenia,scoliosis, degenerative changes                                     Assessment & Plan:  1. Other fatigue - POCT CBC - BMP8+EGFR - Vit D  25 hydroxy (rtn osteoporosis monitoring)  2. Hyperlipemia - BMP8+EGFR - NMR, lipoprofile - Hepatic function panel  3. Essential hypertension - BMP8+EGFR  4. Vitamin D deficiency - Vit D  25 hydroxy (rtn osteoporosis monitoring)  5. Need for vaccination - Pneumococcal conjugate vaccine 13-valent  6. Hip pain, unspecified laterality - DG Hip Complete Left; Future - DG Hip Complete Right; Future  7. Neuropathy - DG Lumbar Spine 2-3 Views; Future  8. Depression   No orders of the defined types were placed in this encounter.   Patient Instructions  Continue current medications. Continue good therapeutic  lifestyle changes which include good diet and exercise. Fall precautions discussed with patient. If an FOBT was given today- please return it to our front desk. If you are over 14 years old - you may need Prevnar 58 or the adult Pneumonia vaccine. Try to exercise and walk more You can continue taking the Advil but make sure you take after eating We will call you with the results of the x-rays once those results are unavailable   Arrie Senate MD

## 2013-09-21 NOTE — Patient Instructions (Addendum)
Continue current medications. Continue good therapeutic lifestyle changes which include good diet and exercise. Fall precautions discussed with patient. If an FOBT was given today- please return it to our front desk. If you are over 78 years old - you may need Prevnar 69 or the adult Pneumonia vaccine. Try to exercise and walk more You can continue taking the Advil but make sure you take after eating We will call you with the results of the x-rays once those results are unavailable

## 2013-09-22 LAB — BMP8+EGFR
BUN/Creatinine Ratio: 28 — ABNORMAL HIGH (ref 11–26)
BUN: 20 mg/dL (ref 8–27)
CO2: 31 mmol/L — AB (ref 18–29)
CREATININE: 0.72 mg/dL (ref 0.57–1.00)
Calcium: 9.7 mg/dL (ref 8.7–10.3)
Chloride: 97 mmol/L (ref 97–108)
GFR calc Af Amer: 91 mL/min/{1.73_m2} (ref >59)
GFR, EST NON AFRICAN AMERICAN: 79 mL/min/{1.73_m2} (ref >59)
GLUCOSE: 88 mg/dL (ref 65–99)
Potassium: 4.4 mmol/L (ref 3.5–5.2)
SODIUM: 143 mmol/L (ref 134–144)

## 2013-09-22 LAB — HEPATIC FUNCTION PANEL
ALT: 5 IU/L (ref 0–32)
AST: 10 IU/L (ref 0–40)
Albumin: 4.2 g/dL (ref 3.5–4.7)
Alkaline Phosphatase: 69 IU/L (ref 39–117)
BILIRUBIN TOTAL: 0.5 mg/dL (ref 0.0–1.2)
Bilirubin, Direct: 0.14 mg/dL (ref 0.00–0.40)
TOTAL PROTEIN: 6.3 g/dL (ref 6.0–8.5)

## 2013-09-22 LAB — NMR, LIPOPROFILE
Cholesterol: 186 mg/dL (ref 100–199)
HDL Cholesterol by NMR: 46 mg/dL (ref >39)
HDL Particle Number: 29.1 umol/L — ABNORMAL LOW (ref >=30.5)
LDL PARTICLE NUMBER: 1434 nmol/L — AB (ref <1000)
LDL SIZE: 20.7 nm (ref >20.5)
LDLC SERPL CALC-MCNC: 115 mg/dL — ABNORMAL HIGH (ref 0–99)
LP-IR Score: 33 (ref <=45)
SMALL LDL PARTICLE NUMBER: 676 nmol/L — AB (ref <=527)
Triglycerides by NMR: 126 mg/dL (ref 0–149)

## 2013-09-22 LAB — VITAMIN D 25 HYDROXY (VIT D DEFICIENCY, FRACTURES): Vit D, 25-Hydroxy: 68.1 ng/mL (ref 30.0–100.0)

## 2013-09-29 ENCOUNTER — Other Ambulatory Visit: Payer: Self-pay | Admitting: *Deleted

## 2013-09-29 MED ORDER — MECLIZINE HCL 32 MG PO TABS: 32.0000 mg | ORAL_TABLET | Freq: Three times a day (TID) | ORAL | Status: DC | PRN

## 2013-10-02 ENCOUNTER — Ambulatory Visit (INDEPENDENT_AMBULATORY_CARE_PROVIDER_SITE_OTHER): Payer: Medicare Other | Admitting: *Deleted

## 2013-10-02 DIAGNOSIS — D509 Iron deficiency anemia, unspecified: Secondary | ICD-10-CM

## 2013-10-02 DIAGNOSIS — E538 Deficiency of other specified B group vitamins: Secondary | ICD-10-CM

## 2013-10-02 NOTE — Progress Notes (Signed)
Vitamin b12 given and tolerated well

## 2013-10-02 NOTE — Patient Instructions (Signed)
Vitamin B12 Injections Every person needs vitamin B12. A deficiency develops when the body does not get enough of it. One way to overcome this is by getting B12 shots (injections). A B12 shot puts the vitamin directly into muscle tissue. This avoids any problems your body might have in absorbing it from food or a pill. In some people, the body has trouble using the vitamin correctly. This can cause a B12 deficiency. Not consuming enough of the vitamin can also cause a deficiency. Getting enough vitamin B12 can be hard for elderly people. Sometimes, they do not eat a well-balanced diet. The elderly are also more likely than younger people to have medical conditions or take medications that can lead to a deficiency. WHAT DOES VITAMIN B12 DO? Vitamin B12 does many things to help the body work right:  It helps the body make healthy red blood cells.  It helps maintain nerve cells.  It is involved in the body's process of converting food into energy (metabolism).  It is needed to make the genetic material in all cells (DNA). VITAMIN B12 FOOD SOURCES Most people get plenty of vitamin B12 through the foods they eat. It is present in:  Meat, fish, poultry, and eggs.  Milk and milk products.  It also is added when certain foods are made, including some breads, cereals and yogurts. The food is then called "fortified". CAUSES The most common causes of vitamin B12 deficiency are:  Pernicious anemia. The condition develops when the body cannot make enough healthy red blood cells. This stems from a lack of a protein made in the stomach (intrinsic factor). People without this protein cannot absorb enough vitamin B12 from food.  Malabsorption. This is when the body cannot absorb the vitamin. It can be caused by:  Pernicious anemia.  Surgery to remove part or all of the stomach can lead to malabsorption. Removal of part or all of the small intestine can also cause malabsorption.  Vegetarian diet.  People who are strict about not eating foods from animals could have trouble taking in enough vitamin B12 from diet alone.  Medications. Some medicines have been linked to B12 deficiency, such as Metformin (a drug prescribed for type 2 diabetes). Long-term use of stomach acid suppressants also can keep the vitamin from being absorbed.  Intestinal problems such as inflammatory bowel disease. If there are problems in the digestive tract, vitamin B12 may not be absorbed in good enough amounts. SYMPTOMS People who do not get enough B12 can develop problems. These can include:  Anemia. This is when the body has too few red blood cells. Red blood cells carry oxygen to the rest of the body. Without a healthy supply of red blood cells, people can feel:  Tired (fatigued).  Weak.  Severe anemia can cause:  Shortness of breath.  Dizziness.  Rapid heart rate.  Paleness.  Other Vitamin B12 deficiency symptoms include:  Diarrhea.  Numbness or tingling in the hands or feet.  Loss of appetite.  Confusion.  Sores on the tongue or in the mouth. LET YOUR CAREGIVER KNOW ABOUT:  Any allergies. It is very important to know if you are allergic or sensitive to cobalt. Vitamin B12 contains cobalt.  Any history of kidney disease.  All medications you are taking. Include prescription and over-the-counter medicines, herbs and creams.  Whether you are pregnant or breast-feeding.  If you have Leber's disease, a hereditary eye condition, vitamin B12 could make it worse. RISKS AND COMPLICATIONS Reactions to an injection are   usually temporary. They might include:  Pain at the injection site.  Redness, swelling or tenderness at the site.  Headache, dizziness or weakness.  Nausea, upset stomach or diarrhea.  Numbness or tingling.  Fever.  Joint pain.  Itching or rash. If a reaction does not go away in a short while, talk with your healthcare provider. A change in the way the shots are  given, or where they are given, might need to be made. BEFORE AN INJECTION To decide whether B12 injections are right for you, your healthcare provider will probably:  Ask about your medical history.  Ask questions about your diet.  Ask about symptoms such as:  Have you felt weak?  Do you feel unusually tired?  Do you get dizzy?  Order blood tests. These may include a test to:  Check the level of red cells in your blood.  Measure B12 levels.  Check for the presence of intrinsic factor. VITAMIN B12 INJECTIONS How often you will need a vitamin B12 injection will depend on how severe your deficiency is. This also will affect how long you will need to get them. People with pernicious anemia usually get injections for their entire life. Others might get them for a shorter period. For many people, injections are given daily or weekly for several weeks. Then, once B12 levels are normal, injections are given just once a month. If the cause of the deficiency can be fixed, the injections can be stopped. Talk with your healthcare provider about what you should expect. For an injection:  The injection site will be cleaned with an alcohol swab.  Your healthcare provider will insert a needle directly into a muscle. Most any muscle can be used. Most often, an arm muscle is used. A buttocks muscle can also be used. Many people say shots in that area are less painful.  A small adhesive bandage may be put over the injection site. It usually can be taken off in an hour or less. Injections can be given by your healthcare provider. In some cases, family members give them. Sometimes, people give them to themselves. Talk with your healthcare provider about what would be best for you. If someone other than your healthcare provider will be giving the shots, the person will need to be trained to give them correctly. HOME CARE INSTRUCTIONS   You can remove the adhesive bandage within an hour of getting a  shot.  You should be able to go about your normal activities right away.  Avoid drinking large amounts of alcohol while taking vitamin B12 shots. Alcohol can interfere with the body's use of the vitamin. SEEK MEDICAL CARE IF:   Pain, redness, swelling or tenderness at the injection site does not get better or gets worse.  Headache, dizziness or weakness does not go away.  You develop a fever of more than 100.5 F (38.1 C). SEEK IMMEDIATE MEDICAL CARE IF:   You have chest pain.  You develop shortness of breath.  You have muscle weakness that gets worse.  You develop numbness, weakness or tingling on one side or one area of the body.  You have symptoms of an allergic reaction, such as:  Hives.  Difficulty breathing.  Swelling of the lips, face, tongue or throat.  You develop a fever of more than 102.0 F (38.9 C). MAKE SURE YOU:   Understand these instructions.  Will watch your condition.  Will get help right away if you are not doing well or get worse. Document   Released: 06/01/2008 Document Revised: 05/28/2011 Document Reviewed: 06/01/2008 [employer] Patient Information 2015 [employer]. This information is not intended to replace advice given to you by your health care provider. Make sure you discuss any questions you have with your health care provider.  

## 2013-10-07 ENCOUNTER — Ambulatory Visit (INDEPENDENT_AMBULATORY_CARE_PROVIDER_SITE_OTHER): Payer: Medicare Other

## 2013-10-07 ENCOUNTER — Ambulatory Visit (INDEPENDENT_AMBULATORY_CARE_PROVIDER_SITE_OTHER): Payer: Medicare Other | Admitting: Family Medicine

## 2013-10-07 ENCOUNTER — Encounter: Payer: Self-pay | Admitting: Family Medicine

## 2013-10-07 ENCOUNTER — Telehealth: Payer: Self-pay | Admitting: Family Medicine

## 2013-10-07 VITALS — BP 88/58 | HR 72 | Temp 98.5°F | Ht 66.0 in | Wt 136.4 lb

## 2013-10-07 DIAGNOSIS — S99922A Unspecified injury of left foot, initial encounter: Secondary | ICD-10-CM

## 2013-10-07 DIAGNOSIS — S99919A Unspecified injury of unspecified ankle, initial encounter: Secondary | ICD-10-CM

## 2013-10-07 DIAGNOSIS — S99929A Unspecified injury of unspecified foot, initial encounter: Secondary | ICD-10-CM

## 2013-10-07 DIAGNOSIS — S8990XA Unspecified injury of unspecified lower leg, initial encounter: Secondary | ICD-10-CM

## 2013-10-07 NOTE — Progress Notes (Signed)
   Subjective:    [name], female    DOB: 1931-06-08, 80 y.o.   MRN: [id number]  HPI  This 80 y.o. female presents for evaluation of injury of left foot when she accidentally hit her foot against the safe.  Review of Systems C/o left foot pain   No chest pain, SOB, HA, dizziness, vision change, N/V, diarrhea, constipation, dysuria, urinary urgency or frequency, myalgias, arthralgias or rash.  Objective:   Physical Exam  Left foot with TTP 3rd through 5th metatarsals.  No deformity  Xray Left foot - No fx Prelimnary reading by [facility]:  Toe injury, left, initial encounter - Plan: DG Foot Complete Left Take tylenol an motrin otc as directed  Lysbeth Penner FNP

## 2013-10-07 NOTE — Telephone Encounter (Signed)
Patient coming at 4:30 in case she needs xray

## 2013-11-03 ENCOUNTER — Ambulatory Visit (INDEPENDENT_AMBULATORY_CARE_PROVIDER_SITE_OTHER): Payer: Medicare Other | Admitting: *Deleted

## 2013-11-03 DIAGNOSIS — E538 Deficiency of other specified B group vitamins: Secondary | ICD-10-CM

## 2013-11-03 DIAGNOSIS — D509 Iron deficiency anemia, unspecified: Secondary | ICD-10-CM

## 2013-11-03 NOTE — Progress Notes (Signed)
Patient tolerated well.

## 2013-11-03 NOTE — Patient Instructions (Signed)
Vitamin B12 Injections Every person needs vitamin B12. A deficiency develops when the body does not get enough of it. One way to overcome this is by getting B12 shots (injections). A B12 shot puts the vitamin directly into muscle tissue. This avoids any problems your body might have in absorbing it from food or a pill. In some people, the body has trouble using the vitamin correctly. This can cause a B12 deficiency. Not consuming enough of the vitamin can also cause a deficiency. Getting enough vitamin B12 can be hard for elderly people. Sometimes, they do not eat a well-balanced diet. The elderly are also more likely than younger people to have medical conditions or take medications that can lead to a deficiency. WHAT DOES VITAMIN B12 DO? Vitamin B12 does many things to help the body work right:  It helps the body make healthy red blood cells.  It helps maintain nerve cells.  It is involved in the body's process of converting food into energy (metabolism).  It is needed to make the genetic material in all cells (DNA). VITAMIN B12 FOOD SOURCES Most people get plenty of vitamin B12 through the foods they eat. It is present in:  Meat, fish, poultry, and eggs.  Milk and milk products.  It also is added when certain foods are made, including some breads, cereals and yogurts. The food is then called "fortified". CAUSES The most common causes of vitamin B12 deficiency are:  Pernicious anemia. The condition develops when the body cannot make enough healthy red blood cells. This stems from a lack of a protein made in the stomach (intrinsic factor). People without this protein cannot absorb enough vitamin B12 from food.  Malabsorption. This is when the body cannot absorb the vitamin. It can be caused by:  Pernicious anemia.  Surgery to remove part or all of the stomach can lead to malabsorption. Removal of part or all of the small intestine can also cause malabsorption.  Vegetarian diet.  People who are strict about not eating foods from animals could have trouble taking in enough vitamin B12 from diet alone.  Medications. Some medicines have been linked to B12 deficiency, such as Metformin (a drug prescribed for type 2 diabetes). Long-term use of stomach acid suppressants also can keep the vitamin from being absorbed.  Intestinal problems such as inflammatory bowel disease. If there are problems in the digestive tract, vitamin B12 may not be absorbed in good enough amounts. SYMPTOMS People who do not get enough B12 can develop problems. These can include:  Anemia. This is when the body has too few red blood cells. Red blood cells carry oxygen to the rest of the body. Without a healthy supply of red blood cells, people can feel:  Tired (fatigued).  Weak.  Severe anemia can cause:  Shortness of breath.  Dizziness.  Rapid heart rate.  Paleness.  Other Vitamin B12 deficiency symptoms include:  Diarrhea.  Numbness or tingling in the hands or feet.  Loss of appetite.  Confusion.  Sores on the tongue or in the mouth. LET YOUR CAREGIVER KNOW ABOUT:  Any allergies. It is very important to know if you are allergic or sensitive to cobalt. Vitamin B12 contains cobalt.  Any history of kidney disease.  All medications you are taking. Include prescription and over-the-counter medicines, herbs and creams.  Whether you are pregnant or breast-feeding.  If you have Leber's disease, a hereditary eye condition, vitamin B12 could make it worse. RISKS AND COMPLICATIONS Reactions to an injection are   usually temporary. They might include:  Pain at the injection site.  Redness, swelling or tenderness at the site.  Headache, dizziness or weakness.  Nausea, upset stomach or diarrhea.  Numbness or tingling.  Fever.  Joint pain.  Itching or rash. If a reaction does not go away in a short while, talk with your healthcare provider. A change in the way the shots are  given, or where they are given, might need to be made. BEFORE AN INJECTION To decide whether B12 injections are right for you, your healthcare provider will probably:  Ask about your medical history.  Ask questions about your diet.  Ask about symptoms such as:  Have you felt weak?  Do you feel unusually tired?  Do you get dizzy?  Order blood tests. These may include a test to:  Check the level of red cells in your blood.  Measure B12 levels.  Check for the presence of intrinsic factor. VITAMIN B12 INJECTIONS How often you will need a vitamin B12 injection will depend on how severe your deficiency is. This also will affect how long you will need to get them. People with pernicious anemia usually get injections for their entire life. Others might get them for a shorter period. For many people, injections are given daily or weekly for several weeks. Then, once B12 levels are normal, injections are given just once a month. If the cause of the deficiency can be fixed, the injections can be stopped. Talk with your healthcare provider about what you should expect. For an injection:  The injection site will be cleaned with an alcohol swab.  Your healthcare provider will insert a needle directly into a muscle. Most any muscle can be used. Most often, an arm muscle is used. A buttocks muscle can also be used. Many people say shots in that area are less painful.  A small adhesive bandage may be put over the injection site. It usually can be taken off in an hour or less. Injections can be given by your healthcare provider. In some cases, family members give them. Sometimes, people give them to themselves. Talk with your healthcare provider about what would be best for you. If someone other than your healthcare provider will be giving the shots, the person will need to be trained to give them correctly. HOME CARE INSTRUCTIONS   You can remove the adhesive bandage within an hour of getting a  shot.  You should be able to go about your normal activities right away.  Avoid drinking large amounts of alcohol while taking vitamin B12 shots. Alcohol can interfere with the body's use of the vitamin. SEEK MEDICAL CARE IF:   Pain, redness, swelling or tenderness at the injection site does not get better or gets worse.  Headache, dizziness or weakness does not go away.  You develop a fever of more than 100.5 F (38.1 C). SEEK IMMEDIATE MEDICAL CARE IF:   You have chest pain.  You develop shortness of breath.  You have muscle weakness that gets worse.  You develop numbness, weakness or tingling on one side or one area of the body.  You have symptoms of an allergic reaction, such as:  Hives.  Difficulty breathing.  Swelling of the lips, face, tongue or throat.  You develop a fever of more than 102.0 F (38.9 C). MAKE SURE YOU:   Understand these instructions.  Will watch your condition.  Will get help right away if you are not doing well or get worse. Document   Released: 06/01/2008 Document Revised: 05/28/2011 Document Reviewed: 06/01/2008 [employer] Patient Information 2015 [employer]. This information is not intended to replace advice given to you by your health care provider. Make sure you discuss any questions you have with your health care provider.  

## 2013-12-07 ENCOUNTER — Ambulatory Visit (INDEPENDENT_AMBULATORY_CARE_PROVIDER_SITE_OTHER): Payer: Medicare Other | Admitting: *Deleted

## 2013-12-07 DIAGNOSIS — E538 Deficiency of other specified B group vitamins: Secondary | ICD-10-CM

## 2013-12-07 DIAGNOSIS — D509 Iron deficiency anemia, unspecified: Secondary | ICD-10-CM

## 2013-12-07 NOTE — Progress Notes (Signed)
Vitamin B12 injection given and tolerated well.

## 2013-12-07 NOTE — Patient Instructions (Signed)
Vitamin B12 Injections Every person needs vitamin B12. A deficiency develops when the body does not get enough of it. One way to overcome this is by getting B12 shots (injections). A B12 shot puts the vitamin directly into muscle tissue. This avoids any problems your body might have in absorbing it from food or a pill. In some people, the body has trouble using the vitamin correctly. This can cause a B12 deficiency. Not consuming enough of the vitamin can also cause a deficiency. Getting enough vitamin B12 can be hard for elderly people. Sometimes, they do not eat a well-balanced diet. The elderly are also more likely than younger people to have medical conditions or take medications that can lead to a deficiency. WHAT DOES VITAMIN B12 DO? Vitamin B12 does many things to help the body work right:  It helps the body make healthy red blood cells.  It helps maintain nerve cells.  It is involved in the body's process of converting food into energy (metabolism).  It is needed to make the genetic material in all cells (DNA). VITAMIN B12 FOOD SOURCES Most people get plenty of vitamin B12 through the foods they eat. It is present in:  Meat, fish, poultry, and eggs.  Milk and milk products.  It also is added when certain foods are made, including some breads, cereals and yogurts. The food is then called "fortified". CAUSES The most common causes of vitamin B12 deficiency are:  Pernicious anemia. The condition develops when the body cannot make enough healthy red blood cells. This stems from a lack of a protein made in the stomach (intrinsic factor). People without this protein cannot absorb enough vitamin B12 from food.  Malabsorption. This is when the body cannot absorb the vitamin. It can be caused by:  Pernicious anemia.  Surgery to remove part or all of the stomach can lead to malabsorption. Removal of part or all of the small intestine can also cause malabsorption.  Vegetarian diet.  People who are strict about not eating foods from animals could have trouble taking in enough vitamin B12 from diet alone.  Medications. Some medicines have been linked to B12 deficiency, such as Metformin (a drug prescribed for type 2 diabetes). Long-term use of stomach acid suppressants also can keep the vitamin from being absorbed.  Intestinal problems such as inflammatory bowel disease. If there are problems in the digestive tract, vitamin B12 may not be absorbed in good enough amounts. SYMPTOMS People who do not get enough B12 can develop problems. These can include:  Anemia. This is when the body has too few red blood cells. Red blood cells carry oxygen to the rest of the body. Without a healthy supply of red blood cells, people can feel:  Tired (fatigued).  Weak.  Severe anemia can cause:  Shortness of breath.  Dizziness.  Rapid heart rate.  Paleness.  Other Vitamin B12 deficiency symptoms include:  Diarrhea.  Numbness or tingling in the hands or feet.  Loss of appetite.  Confusion.  Sores on the tongue or in the mouth. LET YOUR CAREGIVER KNOW ABOUT:  Any allergies. It is very important to know if you are allergic or sensitive to cobalt. Vitamin B12 contains cobalt.  Any history of kidney disease.  All medications you are taking. Include prescription and over-the-counter medicines, herbs and creams.  Whether you are pregnant or breast-feeding.  If you have Leber's disease, a hereditary eye condition, vitamin B12 could make it worse. RISKS AND COMPLICATIONS Reactions to an injection are   usually temporary. They might include:  Pain at the injection site.  Redness, swelling or tenderness at the site.  Headache, dizziness or weakness.  Nausea, upset stomach or diarrhea.  Numbness or tingling.  Fever.  Joint pain.  Itching or rash. If a reaction does not go away in a short while, talk with your healthcare provider. A change in the way the shots are  given, or where they are given, might need to be made. BEFORE AN INJECTION To decide whether B12 injections are right for you, your healthcare provider will probably:  Ask about your medical history.  Ask questions about your diet.  Ask about symptoms such as:  Have you felt weak?  Do you feel unusually tired?  Do you get dizzy?  Order blood tests. These may include a test to:  Check the level of red cells in your blood.  Measure B12 levels.  Check for the presence of intrinsic factor. VITAMIN B12 INJECTIONS How often you will need a vitamin B12 injection will depend on how severe your deficiency is. This also will affect how long you will need to get them. People with pernicious anemia usually get injections for their entire life. Others might get them for a shorter period. For many people, injections are given daily or weekly for several weeks. Then, once B12 levels are normal, injections are given just once a month. If the cause of the deficiency can be fixed, the injections can be stopped. Talk with your healthcare provider about what you should expect. For an injection:  The injection site will be cleaned with an alcohol swab.  Your healthcare provider will insert a needle directly into a muscle. Most any muscle can be used. Most often, an arm muscle is used. A buttocks muscle can also be used. Many people say shots in that area are less painful.  A small adhesive bandage may be put over the injection site. It usually can be taken off in an hour or less. Injections can be given by your healthcare provider. In some cases, family members give them. Sometimes, people give them to themselves. Talk with your healthcare provider about what would be best for you. If someone other than your healthcare provider will be giving the shots, the person will need to be trained to give them correctly. HOME CARE INSTRUCTIONS   You can remove the adhesive bandage within an hour of getting a  shot.  You should be able to go about your normal activities right away.  Avoid drinking large amounts of alcohol while taking vitamin B12 shots. Alcohol can interfere with the body's use of the vitamin. SEEK MEDICAL CARE IF:   Pain, redness, swelling or tenderness at the injection site does not get better or gets worse.  Headache, dizziness or weakness does not go away.  You develop a fever of more than 100.5 F (38.1 C). SEEK IMMEDIATE MEDICAL CARE IF:   You have chest pain.  You develop shortness of breath.  You have muscle weakness that gets worse.  You develop numbness, weakness or tingling on one side or one area of the body.  You have symptoms of an allergic reaction, such as:  Hives.  Difficulty breathing.  Swelling of the lips, face, tongue or throat.  You develop a fever of more than 102.0 F (38.9 C). MAKE SURE YOU:   Understand these instructions.  Will watch your condition.  Will get help right away if you are not doing well or get worse. Document   Released: 06/01/2008 Document Revised: 05/28/2011 Document Reviewed: 06/01/2008 [employer] Patient Information 2015 [employer]. This information is not intended to replace advice given to you by your health care provider. Make sure you discuss any questions you have with your health care provider.  

## 2013-12-13 ENCOUNTER — Other Ambulatory Visit: Payer: Self-pay | Admitting: Family Medicine

## 2013-12-14 MED ORDER — MECLIZINE HCL 32 MG PO TABS: 32.0000 mg | ORAL_TABLET | Freq: Three times a day (TID) | ORAL | Status: DC | PRN

## 2014-01-01 ENCOUNTER — Other Ambulatory Visit: Payer: Self-pay

## 2014-01-07 ENCOUNTER — Ambulatory Visit (INDEPENDENT_AMBULATORY_CARE_PROVIDER_SITE_OTHER): Payer: Medicare Other | Admitting: *Deleted

## 2014-01-07 DIAGNOSIS — D509 Iron deficiency anemia, unspecified: Secondary | ICD-10-CM

## 2014-01-07 DIAGNOSIS — E538 Deficiency of other specified B group vitamins: Secondary | ICD-10-CM

## 2014-01-07 DIAGNOSIS — Z23 Encounter for immunization: Secondary | ICD-10-CM

## 2014-01-07 NOTE — Patient Instructions (Signed)
Vitamin B12 Injections Every person needs vitamin B12. A deficiency develops when the body does not get enough of it. One way to overcome this is by getting B12 shots (injections). A B12 shot puts the vitamin directly into muscle tissue. This avoids any problems your body might have in absorbing it from food or a pill. In some people, the body has trouble using the vitamin correctly. This can cause a B12 deficiency. Not consuming enough of the vitamin can also cause a deficiency. Getting enough vitamin B12 can be hard for elderly people. Sometimes, they do not eat a well-balanced diet. The elderly are also more likely than younger people to have medical conditions or take medications that can lead to a deficiency. WHAT DOES VITAMIN B12 DO? Vitamin B12 does many things to help the body work right:  It helps the body make healthy red blood cells.  It helps maintain nerve cells.  It is involved in the body's process of converting food into energy (metabolism).  It is needed to make the genetic material in all cells (DNA). VITAMIN B12 FOOD SOURCES Most people get plenty of vitamin B12 through the foods they eat. It is present in:  Meat, fish, poultry, and eggs.  Milk and milk products.  It also is added when certain foods are made, including some breads, cereals and yogurts. The food is then called "fortified". CAUSES The most common causes of vitamin B12 deficiency are:  Pernicious anemia. The condition develops when the body cannot make enough healthy red blood cells. This stems from a lack of a protein made in the stomach (intrinsic factor). People without this protein cannot absorb enough vitamin B12 from food.  Malabsorption. This is when the body cannot absorb the vitamin. It can be caused by:  Pernicious anemia.  Surgery to remove part or all of the stomach can lead to malabsorption. Removal of part or all of the small intestine can also cause malabsorption.  Vegetarian diet.  People who are strict about not eating foods from animals could have trouble taking in enough vitamin B12 from diet alone.  Medications. Some medicines have been linked to B12 deficiency, such as Metformin (a drug prescribed for type 2 diabetes). Long-term use of stomach acid suppressants also can keep the vitamin from being absorbed.  Intestinal problems such as inflammatory bowel disease. If there are problems in the digestive tract, vitamin B12 may not be absorbed in good enough amounts. SYMPTOMS People who do not get enough B12 can develop problems. These can include:  Anemia. This is when the body has too few red blood cells. Red blood cells carry oxygen to the rest of the body. Without a healthy supply of red blood cells, people can feel:  Tired (fatigued).  Weak.  Severe anemia can cause:  Shortness of breath.  Dizziness.  Rapid heart rate.  Paleness.  Other Vitamin B12 deficiency symptoms include:  Diarrhea.  Numbness or tingling in the hands or feet.  Loss of appetite.  Confusion.  Sores on the tongue or in the mouth. LET YOUR CAREGIVER KNOW ABOUT:  Any allergies. It is very important to know if you are allergic or sensitive to cobalt. Vitamin B12 contains cobalt.  Any history of kidney disease.  All medications you are taking. Include prescription and over-the-counter medicines, herbs and creams.  Whether you are pregnant or breast-feeding.  If you have Leber's disease, a hereditary eye condition, vitamin B12 could make it worse. RISKS AND COMPLICATIONS Reactions to an injection are   usually temporary. They might include:  Pain at the injection site.  Redness, swelling or tenderness at the site.  Headache, dizziness or weakness.  Nausea, upset stomach or diarrhea.  Numbness or tingling.  Fever.  Joint pain.  Itching or rash. If a reaction does not go away in a short while, talk with your healthcare provider. A change in the way the shots are  given, or where they are given, might need to be made. BEFORE AN INJECTION To decide whether B12 injections are right for you, your healthcare provider will probably:  Ask about your medical history.  Ask questions about your diet.  Ask about symptoms such as:  Have you felt weak?  Do you feel unusually tired?  Do you get dizzy?  Order blood tests. These may include a test to:  Check the level of red cells in your blood.  Measure B12 levels.  Check for the presence of intrinsic factor. VITAMIN B12 INJECTIONS How often you will need a vitamin B12 injection will depend on how severe your deficiency is. This also will affect how long you will need to get them. People with pernicious anemia usually get injections for their entire life. Others might get them for a shorter period. For many people, injections are given daily or weekly for several weeks. Then, once B12 levels are normal, injections are given just once a month. If the cause of the deficiency can be fixed, the injections can be stopped. Talk with your healthcare provider about what you should expect. For an injection:  The injection site will be cleaned with an alcohol swab.  Your healthcare provider will insert a needle directly into a muscle. Most any muscle can be used. Most often, an arm muscle is used. A buttocks muscle can also be used. Many people say shots in that area are less painful.  A small adhesive bandage may be put over the injection site. It usually can be taken off in an hour or less. Injections can be given by your healthcare provider. In some cases, family members give them. Sometimes, people give them to themselves. Talk with your healthcare provider about what would be best for you. If someone other than your healthcare provider will be giving the shots, the person will need to be trained to give them correctly. HOME CARE INSTRUCTIONS   You can remove the adhesive bandage within an hour of getting a  shot.  You should be able to go about your normal activities right away.  Avoid drinking large amounts of alcohol while taking vitamin B12 shots. Alcohol can interfere with the body's use of the vitamin. SEEK MEDICAL CARE IF:   Pain, redness, swelling or tenderness at the injection site does not get better or gets worse.  Headache, dizziness or weakness does not go away.  You develop a fever of more than 100.5 F (38.1 C). SEEK IMMEDIATE MEDICAL CARE IF:   You have chest pain.  You develop shortness of breath.  You have muscle weakness that gets worse.  You develop numbness, weakness or tingling on one side or one area of the body.  You have symptoms of an allergic reaction, such as:  Hives.  Difficulty breathing.  Swelling of the lips, face, tongue or throat.  You develop a fever of more than 102.0 F (38.9 C). MAKE SURE YOU:   Understand these instructions.  Will watch your condition.  Will get help right away if you are not doing well or get worse. Document   Released: 06/01/2008 Document Revised: 05/28/2011 Document Reviewed: 06/01/2008 [employer] Patient Information 2015 [employer]. This information is not intended to replace advice given to you by your health care provider. Make sure you discuss any questions you have with your health care provider.  

## 2014-01-07 NOTE — Progress Notes (Signed)
[name], female   DOB: April 14, 1931, 80 y.o.   MRN: [id number] Vitamin b12 given and tolerated well and Influenza.

## 2014-01-22 ENCOUNTER — Ambulatory Visit (INDEPENDENT_AMBULATORY_CARE_PROVIDER_SITE_OTHER): Payer: Medicare Other

## 2014-01-22 ENCOUNTER — Encounter: Payer: Self-pay | Admitting: Family Medicine

## 2014-01-22 ENCOUNTER — Ambulatory Visit (INDEPENDENT_AMBULATORY_CARE_PROVIDER_SITE_OTHER): Payer: Medicare Other | Admitting: Family Medicine

## 2014-01-22 VITALS — BP 90/58 | HR 57 | Temp 96.6°F | Ht 66.0 in | Wt 142.0 lb

## 2014-01-22 DIAGNOSIS — E785 Hyperlipidemia, unspecified: Secondary | ICD-10-CM

## 2014-01-22 DIAGNOSIS — R05 Cough: Secondary | ICD-10-CM

## 2014-01-22 DIAGNOSIS — K219 Gastro-esophageal reflux disease without esophagitis: Secondary | ICD-10-CM

## 2014-01-22 DIAGNOSIS — R059 Cough, unspecified: Secondary | ICD-10-CM

## 2014-01-22 DIAGNOSIS — E039 Hypothyroidism, unspecified: Secondary | ICD-10-CM

## 2014-01-22 DIAGNOSIS — D509 Iron deficiency anemia, unspecified: Secondary | ICD-10-CM

## 2014-01-22 DIAGNOSIS — J441 Chronic obstructive pulmonary disease with (acute) exacerbation: Secondary | ICD-10-CM

## 2014-01-22 DIAGNOSIS — J209 Acute bronchitis, unspecified: Secondary | ICD-10-CM

## 2014-01-22 DIAGNOSIS — I10 Essential (primary) hypertension: Secondary | ICD-10-CM

## 2014-01-22 DIAGNOSIS — J440 Chronic obstructive pulmonary disease with acute lower respiratory infection: Secondary | ICD-10-CM

## 2014-01-22 DIAGNOSIS — E538 Deficiency of other specified B group vitamins: Secondary | ICD-10-CM

## 2014-01-22 DIAGNOSIS — E559 Vitamin D deficiency, unspecified: Secondary | ICD-10-CM

## 2014-01-22 LAB — POCT CBC
GRANULOCYTE PERCENT: 61.3 % (ref 37–80)
HCT, POC: 44 % (ref 37.7–47.9)
Hemoglobin: 14.5 g/dL (ref 12.2–16.2)
LYMPH, POC: 2.9 (ref 0.6–3.4)
MCH, POC: 27.3 pg (ref 27–31.2)
MCHC: 33 g/dL (ref 31.8–35.4)
MCV: 82.7 fL (ref 80–97)
MPV: 8.1 fL (ref 0–99.8)
POC GRANULOCYTE: 5.3 (ref 2–6.9)
POC LYMPH PERCENT: 32.8 %L (ref 10–50)
Platelet Count, POC: 185 10*3/uL (ref 142–424)
RBC: 5.3 M/uL (ref 4.04–5.48)
RDW, POC: 14.2 %
WBC: 8.7 10*3/uL (ref 4.6–10.2)

## 2014-01-22 MED ORDER — AZITHROMYCIN 250 MG PO TABS: ORAL_TABLET | ORAL | Status: DC

## 2014-01-22 MED ORDER — MECLIZINE HCL 32 MG PO TABS
32.0000 mg | ORAL_TABLET | Freq: Three times a day (TID) | ORAL | Status: DC | PRN
Start: 2014-01-22 — End: 2016-09-07

## 2014-01-22 MED ORDER — HYDROCHLOROTHIAZIDE 25 MG PO TABS: ORAL_TABLET | ORAL | Status: DC

## 2014-01-22 NOTE — Patient Instructions (Addendum)
Continue to take Mucinex regularly twice daily with a large glass of water Take antibiotic as directed--a Z-Pak was called in for her. Drink plenty of fluids Check blood pressures periodically and both arms and bring these readings by 4 review in 4 weeks Continued to be careful and do not put herself at risk for falling Check with the pharmacy and went worth and find out which stomach medication will be or can be obtained to help your stomach and we will okay that Please consider getting a Lifeline to wear around her neck

## 2014-01-22 NOTE — Progress Notes (Signed)
Subjective:    [name], female    DOB: 04/22/1931, 80 y.o.   MRN: [id number]  HPI Pt here for follow up and management of chronic medical problems. The patient comes to the visit today with her daughter. She does complain of a cough. She is due to get a chest x-ray today and lab work today. She is requesting refills on her meclizine and hydrochlorothiazide. In essence she has not been coughing that much more than usual but maybe a little bit more.        Patient Active Problem List   Diagnosis Date Noted  . Peripheral vascular insufficiency 09/21/2013  . Hip pain 09/21/2013  . Osteoporosis, senile 07/15/2013  . Vitamin D deficiency   . Iron deficiency 08/22/2010  . GERD (gastroesophageal reflux disease) 08/22/2010  . Guaiac positive stools 08/22/2010  . BOWEN'S DISEASE 12/24/2008  . Hypothyroidism 12/24/2008  . HEMORRHOIDS 12/24/2008  . RECTAL BLEEDING 12/24/2008  . ARTHRITIS 12/24/2008  . NEOPLASM, MALIGNANT, ANAL CANAL, SQUAMOUS CELL 12/23/2008  . HYPERLIPIDEMIA 12/23/2008  . CARDIOMYOPATHY 12/23/2008  . CHF 12/23/2008  . COPD 12/23/2008  . HIATAL HERNIA 12/23/2008  . DIVERTICULOSIS, COLON 12/23/2008  . RENAL CYST 12/23/2008   Outpatient Encounter Prescriptions as of 01/22/2014  Medication Sig  . albuterol (PROVENTIL HFA;VENTOLIN HFA) 108 (90 BASE) MCG/ACT inhaler Inhale 1 puff into the lungs as needed.  Marland Kitchen albuterol (PROVENTIL) (2.5 MG/3ML) 0.083% nebulizer solution Take 3 mLs (2.5 mg total) by nebulization every 6 (six) hours as needed for wheezing.  . budesonide (PULMICORT) 0.5 MG/2ML nebulizer solution Take 2 mLs (0.5 mg total) by nebulization 2 (two) times daily.  . Cholecalciferol (VITAMIN D3) 1000 UNITS tablet Take 2,000 Units by mouth daily.   . cyanocobalamin (,VITAMIN B-12,) 1000 MCG/ML injection Inject 1,000 mcg into the muscle every 30 (thirty) days.   Marland Kitchen esomeprazole (NEXIUM) 40 MG capsule Take 40 mg by mouth daily before breakfast.    .  hydrochlorothiazide (HYDRODIURIL) 25 MG tablet TAKE ONE TABLET BY MOUTH ONCE DAILY  . levothyroxine (SYNTHROID, LEVOTHROID) 112 MCG tablet Take 112 mcg by mouth daily.  . meclizine (ANTIVERT) 32 MG tablet Take 1 tablet (32 mg total) by mouth 3 (three) times daily as needed.  Marland Kitchen omega-3 acid ethyl esters (LOVAZA) 1 G capsule Take 1 g by mouth daily.   . Pitavastatin Calcium (LIVALO) 4 MG TABS Take 1 tablet by mouth daily.     Review of Systems  Constitutional: Negative.   HENT: Negative.   Eyes: Negative.   Respiratory: Positive for cough.   Cardiovascular: Negative.   Gastrointestinal: Negative.   Endocrine: Negative.   Genitourinary: Negative.   Musculoskeletal: Negative.   Skin: Negative.   Allergic/Immunologic: Negative.   Neurological: Negative.   Hematological: Negative.   Psychiatric/Behavioral: Negative.        Objective:   Physical Exam  Constitutional: She is oriented to person, place, and time. No distress.  The patient is alert and elderly appearing  HENT:  Head: Normocephalic and atraumatic.  Right Ear: External ear normal.  Left Ear: External ear normal.  Nose: Nose normal.  Mouth/Throat: Oropharynx is clear and moist.  Eyes: Conjunctivae and EOM are normal. Pupils are equal, round, and reactive to light. Right eye exhibits no discharge. Left eye exhibits no discharge. No scleral icterus.  Neck: Normal range of motion. Neck supple. No thyromegaly present.  No carotid bruits  Cardiovascular: Normal rate, regular rhythm and normal heart sounds.   No murmur heard.  At 72/m with pulses in the lower extremities that were difficult to palpate  Pulmonary/Chest: Effort normal and breath sounds normal. No respiratory distress. She has no wheezes. She has no rales. She exhibits no tenderness.  No axillary adenopathy, increased coughing with deep breathing and rhonchi present with coughing  Abdominal: Soft. Bowel sounds are normal. She exhibits no mass. There is no  tenderness. There is no rebound and no guarding.  Musculoskeletal: Normal range of motion. She exhibits no edema or tenderness.  Lymphadenopathy:    She has no cervical adenopathy.  Neurological: She is alert and oriented to person, place, and time. She has normal reflexes. No cranial nerve deficit.  Skin: Skin is warm and dry. No rash noted.  Very dry skin  Psychiatric: She has a normal mood and affect. Her behavior is normal. Judgment and thought content normal.  Nursing note and vitals reviewed.  BP 90/58 mmHg  Pulse 57  Temp(Src) 96.6 F (35.9 C) (Oral)  Ht '5\' 6"'  (1.676 m)  Wt 142 lb (64.411 kg)  BMI 22.93 kg/m2  [facility] reading (PRIMARY) by  Dr. Brunilda Payor x-ray-- COPD changes without active disease                                     Assessment & Plan:  1. B12 deficiency - POCT CBC  2. Hyperlipemia - POCT CBC - NMR, lipoprofile - DG Chest 2 View; Future  3. Essential hypertension - POCT CBC - BMP8+EGFR - Hepatic function panel - DG Chest 2 View; Future  4. Vitamin D deficiency - POCT CBC - Vit D  25 hydroxy (rtn osteoporosis monitoring)  5. Gastroesophageal reflux disease, esophagitis presence not specified - POCT CBC  6. Anemia, iron deficiency - POCT CBC  7. Cough - DG Chest 2 View; Future  8. Hypothyroidism, unspecified hypothyroidism type - Thyroid Panel With TSH  9. COPD (chronic obstructive pulmonary disease) with acute bronchitis  Meds ordered this encounter  Medications  . meclizine (ANTIVERT) 32 MG tablet    Sig: Take 1 tablet (32 mg total) by mouth 3 (three) times daily as needed.    Dispense:  30 tablet    Refill:  6  . hydrochlorothiazide (HYDRODIURIL) 25 MG tablet    Sig: TAKE ONE TABLET BY MOUTH ONCE DAILY    Dispense:  90 tablet    Refill:  3  . azithromycin (ZITHROMAX) 250 MG tablet    Sig: As directed    Dispense:  6 tablet    Refill:  0    Patient Instructions  Continue to take Mucinex regularly twice daily with a large  glass of water Take antibiotic as directed Drink plenty of fluids Check blood pressures periodically and both arms and bring these readings by 4 review in 4 weeks Continued to be careful and do not put herself at risk for falling Check with the pharmacy and went worth and find out which stomach medication will be or can be obtained to help your stomach and we will okay that Please consider getting a Lifeline to wear around her neck   Arrie Senate MD

## 2014-01-23 LAB — NMR, LIPOPROFILE
Cholesterol: 190 mg/dL (ref 100–199)
HDL CHOLESTEROL BY NMR: 47 mg/dL (ref >39)
HDL Particle Number: 25.7 umol/L — ABNORMAL LOW (ref >=30.5)
LDL PARTICLE NUMBER: 1536 nmol/L — AB (ref <1000)
LDL Size: 20.9 nm (ref >20.5)
LDL-C: 120 mg/dL — AB (ref 0–99)
LP-IR Score: 43 (ref <=45)
Small LDL Particle Number: 772 nmol/L — ABNORMAL HIGH (ref <=527)
Triglycerides by NMR: 116 mg/dL (ref 0–149)

## 2014-01-23 LAB — BMP8+EGFR
BUN / CREAT RATIO: 27 — AB (ref 11–26)
BUN: 21 mg/dL (ref 8–27)
CHLORIDE: 96 mmol/L — AB (ref 97–108)
CO2: 29 mmol/L (ref 18–29)
Calcium: 9.7 mg/dL (ref 8.7–10.3)
Creatinine, Ser: 0.79 mg/dL (ref 0.57–1.00)
GFR calc Af Amer: 82 mL/min/{1.73_m2} (ref >59)
GFR calc non Af Amer: 71 mL/min/{1.73_m2} (ref >59)
Glucose: 96 mg/dL (ref 65–99)
POTASSIUM: 3.7 mmol/L (ref 3.5–5.2)
Sodium: 142 mmol/L (ref 134–144)

## 2014-01-23 LAB — HEPATIC FUNCTION PANEL
ALBUMIN: 4.6 g/dL (ref 3.5–4.7)
ALT: 6 IU/L (ref 0–32)
AST: 11 IU/L (ref 0–40)
Alkaline Phosphatase: 65 IU/L (ref 39–117)
Bilirubin, Direct: 0.13 mg/dL (ref 0.00–0.40)
Total Bilirubin: 0.4 mg/dL (ref 0.0–1.2)
Total Protein: 6.9 g/dL (ref 6.0–8.5)

## 2014-01-23 LAB — THYROID PANEL WITH TSH
FREE THYROXINE INDEX: 3 (ref 1.2–4.9)
T3 UPTAKE RATIO: 26 % (ref 24–39)
T4, Total: 11.7 ug/dL (ref 4.5–12.0)
TSH: 0.142 u[IU]/mL — AB (ref 0.450–4.500)

## 2014-01-23 LAB — VITAMIN D 25 HYDROXY (VIT D DEFICIENCY, FRACTURES): VIT D 25 HYDROXY: 72 ng/mL (ref 30.0–100.0)

## 2014-01-26 ENCOUNTER — Telehealth: Payer: Self-pay | Admitting: *Deleted

## 2014-01-26 NOTE — Telephone Encounter (Signed)
-----   Message from Chipper Herb, MD sent at 01/23/2014  9:45 AM EST ----- The blood sugar is good at 96. The creatinine, the most important kidney function test is within normal limits. The electrolytes including potassium are within normal limits except the chloride is slightly decreased but this is okay. All liver function tests are within normal limits With advanced lipid testing, the total LDL particle number remains elevated at 1536. The LDL C is elevated at 120. The triglycerides are good at 116.----please make sure that the patient is taking her cholesterol medicine regularly. Please confirm the current milligram dose and the frequency that she is taking it. Also reemphasize therapeutic lifestyle changes which include diet and exercise The TSH is low.please confirm the patient's current dosage and regimen for taking levothyroxine. According to the records she is taking 0.112 daily? Please confirm this and we will have to adjust the dosage downward after talking with patient. The vitamin D level is good, continue current treatment

## 2014-01-27 ENCOUNTER — Telehealth: Payer: Self-pay | Admitting: *Deleted

## 2014-01-27 NOTE — Telephone Encounter (Signed)
Left message to take thyroid medicine as usual except on Sunday take half a pill.   Please follow up with [place] in 4-6 weeks to recheck thyroid level.  Please call to confirm message was received.

## 2014-01-27 NOTE — Telephone Encounter (Signed)
Patient aware in change of directions for thyroid medicine.

## 2014-01-27 NOTE — Telephone Encounter (Signed)
Patient is taking her  112 mcg thyroid medication in am before eating every day.  She is also taking her livalo 2mg  medicine daily.

## 2014-01-27 NOTE — Telephone Encounter (Signed)
The patient should reduce the levothyroxine to 1 every day except a half or one on Sunday and we should recheck the thyroid profile in 4-6 weeks.-Please call her daughter, Sharlotte Alamo with this information

## 2014-02-08 ENCOUNTER — Ambulatory Visit (INDEPENDENT_AMBULATORY_CARE_PROVIDER_SITE_OTHER): Payer: Medicare Other | Admitting: *Deleted

## 2014-02-08 DIAGNOSIS — D509 Iron deficiency anemia, unspecified: Secondary | ICD-10-CM

## 2014-02-08 DIAGNOSIS — E538 Deficiency of other specified B group vitamins: Secondary | ICD-10-CM

## 2014-02-08 NOTE — Progress Notes (Signed)
Vitamin b12 given and tolerated well

## 2014-02-08 NOTE — Patient Instructions (Signed)
Vitamin B12 Injections Every person needs vitamin B12. A deficiency develops when the body does not get enough of it. One way to overcome this is by getting B12 shots (injections). A B12 shot puts the vitamin directly into muscle tissue. This avoids any problems your body might have in absorbing it from food or a pill. In some people, the body has trouble using the vitamin correctly. This can cause a B12 deficiency. Not consuming enough of the vitamin can also cause a deficiency. Getting enough vitamin B12 can be hard for elderly people. Sometimes, they do not eat a well-balanced diet. The elderly are also more likely than younger people to have medical conditions or take medications that can lead to a deficiency. WHAT DOES VITAMIN B12 DO? Vitamin B12 does many things to help the body work right:  It helps the body make healthy red blood cells.  It helps maintain nerve cells.  It is involved in the body's process of converting food into energy (metabolism).  It is needed to make the genetic material in all cells (DNA). VITAMIN B12 FOOD SOURCES Most people get plenty of vitamin B12 through the foods they eat. It is present in:  Meat, fish, poultry, and eggs.  Milk and milk products.  It also is added when certain foods are made, including some breads, cereals and yogurts. The food is then called "fortified". CAUSES The most common causes of vitamin B12 deficiency are:  Pernicious anemia. The condition develops when the body cannot make enough healthy red blood cells. This stems from a lack of a protein made in the stomach (intrinsic factor). People without this protein cannot absorb enough vitamin B12 from food.  Malabsorption. This is when the body cannot absorb the vitamin. It can be caused by:  Pernicious anemia.  Surgery to remove part or all of the stomach can lead to malabsorption. Removal of part or all of the small intestine can also cause malabsorption.  Vegetarian diet.  People who are strict about not eating foods from animals could have trouble taking in enough vitamin B12 from diet alone.  Medications. Some medicines have been linked to B12 deficiency, such as Metformin (a drug prescribed for type 2 diabetes). Long-term use of stomach acid suppressants also can keep the vitamin from being absorbed.  Intestinal problems such as inflammatory bowel disease. If there are problems in the digestive tract, vitamin B12 may not be absorbed in good enough amounts. SYMPTOMS People who do not get enough B12 can develop problems. These can include:  Anemia. This is when the body has too few red blood cells. Red blood cells carry oxygen to the rest of the body. Without a healthy supply of red blood cells, people can feel:  Tired (fatigued).  Weak.  Severe anemia can cause:  Shortness of breath.  Dizziness.  Rapid heart rate.  Paleness.  Other Vitamin B12 deficiency symptoms include:  Diarrhea.  Numbness or tingling in the hands or feet.  Loss of appetite.  Confusion.  Sores on the tongue or in the mouth. LET YOUR CAREGIVER KNOW ABOUT:  Any allergies. It is very important to know if you are allergic or sensitive to cobalt. Vitamin B12 contains cobalt.  Any history of kidney disease.  All medications you are taking. Include prescription and over-the-counter medicines, herbs and creams.  Whether you are pregnant or breast-feeding.  If you have Leber's disease, a hereditary eye condition, vitamin B12 could make it worse. RISKS AND COMPLICATIONS Reactions to an injection are   usually temporary. They might include:  Pain at the injection site.  Redness, swelling or tenderness at the site.  Headache, dizziness or weakness.  Nausea, upset stomach or diarrhea.  Numbness or tingling.  Fever.  Joint pain.  Itching or rash. If a reaction does not go away in a short while, talk with your healthcare provider. A change in the way the shots are  given, or where they are given, might need to be made. BEFORE AN INJECTION To decide whether B12 injections are right for you, your healthcare provider will probably:  Ask about your medical history.  Ask questions about your diet.  Ask about symptoms such as:  Have you felt weak?  Do you feel unusually tired?  Do you get dizzy?  Order blood tests. These may include a test to:  Check the level of red cells in your blood.  Measure B12 levels.  Check for the presence of intrinsic factor. VITAMIN B12 INJECTIONS How often you will need a vitamin B12 injection will depend on how severe your deficiency is. This also will affect how long you will need to get them. People with pernicious anemia usually get injections for their entire life. Others might get them for a shorter period. For many people, injections are given daily or weekly for several weeks. Then, once B12 levels are normal, injections are given just once a month. If the cause of the deficiency can be fixed, the injections can be stopped. Talk with your healthcare provider about what you should expect. For an injection:  The injection site will be cleaned with an alcohol swab.  Your healthcare provider will insert a needle directly into a muscle. Most any muscle can be used. Most often, an arm muscle is used. A buttocks muscle can also be used. Many people say shots in that area are less painful.  A small adhesive bandage may be put over the injection site. It usually can be taken off in an hour or less. Injections can be given by your healthcare provider. In some cases, family members give them. Sometimes, people give them to themselves. Talk with your healthcare provider about what would be best for you. If someone other than your healthcare provider will be giving the shots, the person will need to be trained to give them correctly. HOME CARE INSTRUCTIONS   You can remove the adhesive bandage within an hour of getting a  shot.  You should be able to go about your normal activities right away.  Avoid drinking large amounts of alcohol while taking vitamin B12 shots. Alcohol can interfere with the body's use of the vitamin. SEEK MEDICAL CARE IF:   Pain, redness, swelling or tenderness at the injection site does not get better or gets worse.  Headache, dizziness or weakness does not go away.  You develop a fever of more than 100.5 F (38.1 C). SEEK IMMEDIATE MEDICAL CARE IF:   You have chest pain.  You develop shortness of breath.  You have muscle weakness that gets worse.  You develop numbness, weakness or tingling on one side or one area of the body.  You have symptoms of an allergic reaction, such as:  Hives.  Difficulty breathing.  Swelling of the lips, face, tongue or throat.  You develop a fever of more than 102.0 F (38.9 C). MAKE SURE YOU:   Understand these instructions.  Will watch your condition.  Will get help right away if you are not doing well or get worse. Document   Released: 06/01/2008 Document Revised: 05/28/2011 Document Reviewed: 06/01/2008 [employer] Patient Information 2015 [employer]. This information is not intended to replace advice given to you by your health care provider. Make sure you discuss any questions you have with your health care provider.  

## 2014-02-09 ENCOUNTER — Encounter: Payer: Self-pay | Admitting: Nurse Practitioner

## 2014-02-09 ENCOUNTER — Ambulatory Visit (INDEPENDENT_AMBULATORY_CARE_PROVIDER_SITE_OTHER): Payer: Medicare Other | Admitting: Nurse Practitioner

## 2014-02-09 VITALS — BP 112/65 | HR 62 | Temp 96.8°F | Ht 66.0 in | Wt 142.0 lb

## 2014-02-09 DIAGNOSIS — S61451A Open bite of right hand, initial encounter: Secondary | ICD-10-CM

## 2014-02-09 DIAGNOSIS — W5501XA Bitten by cat, initial encounter: Secondary | ICD-10-CM

## 2014-02-09 MED ORDER — AMOXICILLIN-POT CLAVULANATE 875-125 MG PO TABS: 1.0000 | ORAL_TABLET | Freq: Two times a day (BID) | ORAL | Status: DC

## 2014-02-09 NOTE — Patient Instructions (Signed)

## 2014-02-09 NOTE — Addendum Note (Signed)
Addended by: Chevis Pretty on: 02/09/2014 10:01 AM   Modules accepted: Orders

## 2014-02-09 NOTE — Progress Notes (Addendum)
   Subjective:    [name], female    DOB: 05-05-1931, 80 y.o.   MRN: [id number]  HPI  Patient got bitten by a CAT- stray cat- the cat died this morning- vet is sending the cat off for evaluation.   Review of Systems  Constitutional: Negative.   HENT: Negative.   Respiratory: Negative.   Cardiovascular: Negative.   Genitourinary: Negative.   Neurological: Negative.   Psychiatric/Behavioral: Negative.   All other systems reviewed and are negative.      Objective:   Physical Exam  Constitutional: She is oriented to person, place, and time. She appears well-developed and well-nourished.  Cardiovascular: Normal rate, regular rhythm and normal heart sounds.   Pulmonary/Chest: Effort normal and breath sounds normal.  Neurological: She is alert and oriented to person, place, and time.  Skin: Skin is warm and dry.  Multiple cat bites to right hand- mild erythema in areas- no drainage- no edema  Psychiatric: She has a normal mood and affect. Her behavior is normal. Judgment and thought content normal.   BP 112/65 mmHg  Pulse 62  Temp(Src) 96.8 F (36 C) (Oral)  Ht 5\' 6"  (1.676 m)  Wt 142 lb (64.411 kg)  BMI 22.93 kg/m2        Assessment & Plan:   1. Cat bite of hand, right, initial encounter   sheriff department  contactedSoak in epsom salt BID Keep clean and dry Finish all of antibiotic We need to get rabies shots- sent to urgent care for shots- patient refusing to go RTO prn  Mary-Margaret Hassell Done, FNP

## 2014-02-10 ENCOUNTER — Telehealth: Payer: Self-pay | Admitting: Nurse Practitioner

## 2014-02-12 NOTE — Telephone Encounter (Signed)
ok 

## 2014-02-28 ENCOUNTER — Other Ambulatory Visit: Payer: Self-pay | Admitting: Family Medicine

## 2014-03-11 ENCOUNTER — Ambulatory Visit (INDEPENDENT_AMBULATORY_CARE_PROVIDER_SITE_OTHER): Payer: Medicare Other | Admitting: *Deleted

## 2014-03-11 ENCOUNTER — Ambulatory Visit: Payer: Medicare Other

## 2014-03-11 DIAGNOSIS — E538 Deficiency of other specified B group vitamins: Secondary | ICD-10-CM

## 2014-03-11 DIAGNOSIS — D509 Iron deficiency anemia, unspecified: Secondary | ICD-10-CM

## 2014-03-11 NOTE — Patient Instructions (Signed)

## 2014-03-11 NOTE — Progress Notes (Signed)
Pt given B12 injection IM right deltoid, pt tolerated injection well.

## 2014-04-12 ENCOUNTER — Ambulatory Visit: Payer: Medicare Other

## 2014-04-19 ENCOUNTER — Ambulatory Visit (INDEPENDENT_AMBULATORY_CARE_PROVIDER_SITE_OTHER): Payer: Medicare Other | Admitting: *Deleted

## 2014-04-19 DIAGNOSIS — D509 Iron deficiency anemia, unspecified: Secondary | ICD-10-CM

## 2014-04-19 DIAGNOSIS — E538 Deficiency of other specified B group vitamins: Secondary | ICD-10-CM

## 2014-04-19 NOTE — Progress Notes (Signed)
Pt given B12 injection IM left deltoid, pt tolerated injection well.

## 2014-04-19 NOTE — Patient Instructions (Signed)

## 2014-05-19 ENCOUNTER — Ambulatory Visit (INDEPENDENT_AMBULATORY_CARE_PROVIDER_SITE_OTHER): Payer: Medicare Other | Admitting: *Deleted

## 2014-05-19 DIAGNOSIS — D509 Iron deficiency anemia, unspecified: Secondary | ICD-10-CM

## 2014-05-19 DIAGNOSIS — E538 Deficiency of other specified B group vitamins: Secondary | ICD-10-CM | POA: Diagnosis not present

## 2014-05-19 NOTE — Progress Notes (Signed)
Pt given B12 injection IM right deltoid, pt tolerated injection well.

## 2014-05-19 NOTE — Patient Instructions (Signed)

## 2014-05-24 ENCOUNTER — Ambulatory Visit (INDEPENDENT_AMBULATORY_CARE_PROVIDER_SITE_OTHER): Payer: Medicare Other | Admitting: Family Medicine

## 2014-05-24 ENCOUNTER — Encounter: Payer: Self-pay | Admitting: Family Medicine

## 2014-05-24 VITALS — BP 137/57 | HR 59 | Temp 96.9°F | Ht 66.0 in | Wt 149.0 lb

## 2014-05-24 DIAGNOSIS — E559 Vitamin D deficiency, unspecified: Secondary | ICD-10-CM | POA: Diagnosis not present

## 2014-05-24 DIAGNOSIS — I10 Essential (primary) hypertension: Secondary | ICD-10-CM

## 2014-05-24 DIAGNOSIS — E039 Hypothyroidism, unspecified: Secondary | ICD-10-CM | POA: Diagnosis not present

## 2014-05-24 DIAGNOSIS — E538 Deficiency of other specified B group vitamins: Secondary | ICD-10-CM | POA: Diagnosis not present

## 2014-05-24 DIAGNOSIS — E785 Hyperlipidemia, unspecified: Secondary | ICD-10-CM

## 2014-05-24 DIAGNOSIS — J440 Chronic obstructive pulmonary disease with acute lower respiratory infection: Secondary | ICD-10-CM

## 2014-05-24 DIAGNOSIS — K219 Gastro-esophageal reflux disease without esophagitis: Secondary | ICD-10-CM | POA: Diagnosis not present

## 2014-05-24 DIAGNOSIS — J209 Acute bronchitis, unspecified: Secondary | ICD-10-CM

## 2014-05-24 DIAGNOSIS — J441 Chronic obstructive pulmonary disease with (acute) exacerbation: Secondary | ICD-10-CM | POA: Diagnosis not present

## 2014-05-24 LAB — POCT CBC
GRANULOCYTE PERCENT: 67.2 % (ref 37–80)
HEMATOCRIT: 44.7 % (ref 37.7–47.9)
HEMOGLOBIN: 14.2 g/dL (ref 12.2–16.2)
Lymph, poc: 2.8 (ref 0.6–3.4)
MCH, POC: 26.9 pg — AB (ref 27–31.2)
MCHC: 31.7 g/dL — AB (ref 31.8–35.4)
MCV: 84.7 fL (ref 80–97)
MPV: 7.5 fL (ref 0–99.8)
POC Granulocyte: 6.4 (ref 2–6.9)
POC LYMPH %: 29.4 % (ref 10–50)
Platelet Count, POC: 190 10*3/uL (ref 142–424)
RBC: 5.28 M/uL (ref 4.04–5.48)
RDW, POC: 14.1 %
WBC: 9.5 10*3/uL (ref 4.6–10.2)

## 2014-05-24 MED ORDER — ALBUTEROL SULFATE (2.5 MG/3ML) 0.083% IN NEBU: 2.5000 mg | INHALATION_SOLUTION | Freq: Four times a day (QID) | RESPIRATORY_TRACT | Status: DC | PRN

## 2014-05-24 NOTE — Progress Notes (Signed)
Subjective:    [name], female    DOB: [birth year], 81 y.o.   MRN: [id number]  HPI Pt here for follow up and management of chronic medical problems which includes hyperlipidemia, hypothyroid, and COPD. She is taking medications regularly. The patient has no complaints today. She does request a refill on her albuterol nebulizer. She is due also to receive an FOBT. She will also get her routine lab work today. The patient comes to the visit today with her daughter. She denies chest pain, shortness of breath, GI symptoms, and difficulty voiding. She is getting out more and try to get as much exercise as possible.        Patient Active Problem List   Diagnosis Date Noted  . Peripheral vascular insufficiency 09/21/2013  . Hip pain 09/21/2013  . Osteoporosis, senile 07/15/2013  . Vitamin D deficiency   . Iron deficiency 08/22/2010  . GERD (gastroesophageal reflux disease) 08/22/2010  . Guaiac positive stools 08/22/2010  . BOWEN'S DISEASE 12/24/2008  . Hypothyroidism 12/24/2008  . HEMORRHOIDS 12/24/2008  . RECTAL BLEEDING 12/24/2008  . ARTHRITIS 12/24/2008  . NEOPLASM, MALIGNANT, ANAL CANAL, SQUAMOUS CELL 12/23/2008  . HYPERLIPIDEMIA 12/23/2008  . CARDIOMYOPATHY 12/23/2008  . CHF 12/23/2008  . COPD 12/23/2008  . HIATAL HERNIA 12/23/2008  . DIVERTICULOSIS, COLON 12/23/2008  . RENAL CYST 12/23/2008   Outpatient Encounter Prescriptions as of 05/24/2014  Medication Sig  . albuterol (PROVENTIL HFA;VENTOLIN HFA) 108 (90 BASE) MCG/ACT inhaler Inhale 1 puff into the lungs as needed.  Marland Kitchen albuterol (PROVENTIL) (2.5 MG/3ML) 0.083% nebulizer solution Take 3 mLs (2.5 mg total) by nebulization every 6 (six) hours as needed for wheezing.  . budesonide (PULMICORT) 0.5 MG/2ML nebulizer solution Take 2 mLs (0.5 mg total) by nebulization 2 (two) times daily.  . Cholecalciferol (VITAMIN D3) 1000 UNITS tablet Take 2,000 Units by mouth daily.   . cyanocobalamin (,VITAMIN B-12,) 1000  MCG/ML injection Inject 1,000 mcg into the muscle every 30 (thirty) days.   Marland Kitchen esomeprazole (NEXIUM) 40 MG capsule Take 40 mg by mouth daily before breakfast.    . hydrochlorothiazide (HYDRODIURIL) 25 MG tablet TAKE ONE TABLET BY MOUTH ONCE DAILY  . levothyroxine (SYNTHROID, LEVOTHROID) 112 MCG tablet Take 112 mcg by mouth daily.  . meclizine (ANTIVERT) 32 MG tablet Take 1 tablet (32 mg total) by mouth 3 (three) times daily as needed.  Marland Kitchen omega-3 acid ethyl esters (LOVAZA) 1 G capsule Take 1 g by mouth daily.   . Pitavastatin Calcium (LIVALO) 4 MG TABS Take 1 tablet by mouth daily.   . [DISCONTINUED] amoxicillin-clavulanate (AUGMENTIN) 875-125 MG per tablet Take 1 tablet by mouth 2 (two) times daily.    Review of Systems  Constitutional: Negative.   HENT: Negative.   Eyes: Negative.   Respiratory: Negative.   Cardiovascular: Negative.   Gastrointestinal: Negative.   Endocrine: Negative.   Genitourinary: Negative.   Musculoskeletal: Negative.   Skin: Negative.   Allergic/Immunologic: Negative.   Neurological: Negative.   Hematological: Negative.   Psychiatric/Behavioral: Negative.        Objective:   Physical Exam  Constitutional: She is oriented to person, place, and time. She appears well-developed and well-nourished. No distress.  The patient is alert.  HENT:  Head: Normocephalic and atraumatic.  Right Ear: External ear normal.  Left Ear: External ear normal.  Nose: Nose normal.  Mouth/Throat: Oropharynx is clear and moist. No oropharyngeal exudate.  Eyes: Conjunctivae and EOM are normal. Pupils are equal, round, and reactive to  light. Right eye exhibits no discharge. Left eye exhibits no discharge. No scleral icterus.  Neck: Normal range of motion. Neck supple. No thyromegaly present.  Cardiovascular: Normal rate, regular rhythm and normal heart sounds.  Exam reveals no gallop and no friction rub.   No murmur heard. The heart has a regular rate and rhythm  Pulmonary/Chest:  Effort normal and breath sounds normal. No respiratory distress. She has no wheezes. She has no rales. She exhibits no tenderness.  The lungs were clear anteriorly and posteriorly without congestion.  Abdominal: Soft. Bowel sounds are normal. She exhibits no mass. There is no tenderness. There is no rebound and no guarding.  The abdomen was nontender and no masses were palpated and there were no bruits.  Musculoskeletal: Normal range of motion. She exhibits no edema.  Lymphadenopathy:    She has no cervical adenopathy.  Neurological: She is alert and oriented to person, place, and time. She has normal reflexes. No cranial nerve deficit.  Skin: Skin is warm and dry. No rash noted.  Psychiatric: She has a normal mood and affect. Her behavior is normal. Judgment and thought content normal.  Nursing note and vitals reviewed.  BP 137/57 mmHg  Pulse 59  Temp(Src) 96.9 F (36.1 C) (Oral)  Ht '5\' 6"'  (1.676 m)  Wt 149 lb (67.586 kg)  BMI 24.06 kg/m2        Assessment & Plan:  1. Vitamin B 12 deficiency -Continue B12 injections pending results of - POCT CBC  2. Hyperlipemia -Continue current treatment as cholesterol numbers have been under good control in the past. - POCT CBC - NMR, lipoprofile  3. Essential hypertension -Blood pressure is good bilaterally except the left numbers are lower than the right and this is normal for her. - POCT CBC - BMP8+EGFR - Hepatic function panel  4. Vitamin D deficiency -Continue vitamin D pending results of current lab work - POCT CBC - Vit D  25 hydroxy (rtn osteoporosis monitoring)  5. Gastroesophageal reflux disease, esophagitis presence not specified -This is stable and the patient has no complaints with this - POCT CBC  6. Hypothyroidism, unspecified hypothyroidism type -The patient should continue with her thyroid replacement as she is not feeling tired and without energy. This may be changed pending results of lab work. - POCT CBC -  Thyroid Panel With TSH  7. COPD (chronic obstructive pulmonary disease) with acute bronchitis -This is stable today and the patient has no complaints with her breathing. - POCT CBC - albuterol (PROVENTIL) (2.5 MG/3ML) 0.083% nebulizer solution; Take 3 mLs (2.5 mg total) by nebulization every 6 (six) hours as needed for wheezing.  Dispense: 225 mL; Refill: 3  Patient Instructions                       Medicare Annual Wellness Visit  [facility] and the medical providers at [facility] strive to bring you the best medical care.  In doing so we not only want to address your current medical conditions and concerns but also to detect new conditions early and prevent illness, disease and health-related problems.    Medicare offers a yearly Wellness Visit which allows our clinical staff to assess your need for preventative services including immunizations, lifestyle education, counseling to decrease risk of preventable diseases and screening for fall risk and other medical concerns.    This visit is provided free of charge (no copay) for all Medicare recipients. The clinical pharmacists at [facility] have begun to conduct these Wellness Visits which will also include a thorough review of all your medications.    As you primary medical provider recommend that you make an appointment for your Annual Wellness Visit if you have not done so already this year.  You may set up this appointment before you leave today or you may call back ([phone]) and schedule an appointment.  Please make sure when you call that you mention that you are scheduling your Annual Wellness Visit with the clinical pharmacist so that the appointment may be made for the proper length of time.     Continue current medications. Continue good therapeutic lifestyle changes which include good diet and exercise. Fall precautions discussed with patient. If an FOBT was given today- please  return it to our front desk. If you are over 71 years old - you may need Prevnar 48 or the adult Pneumonia vaccine.  Flu Shots are still available at our office. If you still haven't had one please call to set up a nurse visit to get one.   After your visit with [place] today you will receive a survey in the mail or online from [employer] regarding your care with [place]. Please take a moment to fill this out. Your feedback is very important to [place] as you can help [place] better understand your patient needs as well as improve your experience and satisfaction. WE CARE ABOUT YOU!!!   The patient should be careful and did not put yourself at risk for falling She should stay as active as possible She should drink plenty of fluids She needs to make an appointment to see her eye doctor   Arrie Senate MD

## 2014-05-24 NOTE — Patient Instructions (Addendum)
Medicare Annual Wellness Visit  [facility] and the medical providers at [facility] strive to bring you the best medical care.  In doing so we not only want to address your current medical conditions and concerns but also to detect new conditions early and prevent illness, disease and health-related problems.    Medicare offers a yearly Wellness Visit which allows our clinical staff to assess your need for preventative services including immunizations, lifestyle education, counseling to decrease risk of preventable diseases and screening for fall risk and other medical concerns.    This visit is provided free of charge (no copay) for all Medicare recipients. The clinical pharmacists at [facility] have begun to conduct these Wellness Visits which will also include a thorough review of all your medications.    As you primary medical provider recommend that you make an appointment for your Annual Wellness Visit if you have not done so already this year.  You may set up this appointment before you leave today or you may call back ([phone]) and schedule an appointment.  Please make sure when you call that you mention that you are scheduling your Annual Wellness Visit with the clinical pharmacist so that the appointment may be made for the proper length of time.     Continue current medications. Continue good therapeutic lifestyle changes which include good diet and exercise. Fall precautions discussed with patient. If an FOBT was given today- please return it to our front desk. If you are over 5 years old - you may need Prevnar 44 or the adult Pneumonia vaccine.  Flu Shots are still available at our office. If you still haven't had one please call to set up a nurse visit to get one.   After your visit with [place] today you will receive a survey in the mail or online from [employer] regarding your care with [place]. Please take a moment to  fill this out. Your feedback is very important to [place] as you can help [place] better understand your patient needs as well as improve your experience and satisfaction. WE CARE ABOUT YOU!!!   The patient should be careful and did not put yourself at risk for falling She should stay as active as possible She should drink plenty of fluids She needs to make an appointment to see her eye doctor

## 2014-05-25 ENCOUNTER — Other Ambulatory Visit: Payer: Medicare Other

## 2014-05-25 DIAGNOSIS — Z1212 Encounter for screening for malignant neoplasm of rectum: Secondary | ICD-10-CM

## 2014-05-25 LAB — HEPATIC FUNCTION PANEL
ALT: 10 IU/L (ref 0–32)
AST: 13 IU/L (ref 0–40)
Albumin: 4.4 g/dL (ref 3.5–4.7)
Alkaline Phosphatase: 65 IU/L (ref 39–117)
Bilirubin Total: 0.4 mg/dL (ref 0.0–1.2)
Bilirubin, Direct: 0.11 mg/dL (ref 0.00–0.40)
TOTAL PROTEIN: 6.4 g/dL (ref 6.0–8.5)

## 2014-05-25 LAB — BMP8+EGFR
BUN/Creatinine Ratio: 29 — ABNORMAL HIGH (ref 11–26)
BUN: 19 mg/dL (ref 8–27)
CHLORIDE: 100 mmol/L (ref 97–108)
CO2: 26 mmol/L (ref 18–29)
Calcium: 9.5 mg/dL (ref 8.7–10.3)
Creatinine, Ser: 0.65 mg/dL (ref 0.57–1.00)
GFR calc non Af Amer: 84 mL/min/{1.73_m2} (ref >59)
GFR, EST AFRICAN AMERICAN: 97 mL/min/{1.73_m2} (ref >59)
GLUCOSE: 86 mg/dL (ref 65–99)
POTASSIUM: 3.7 mmol/L (ref 3.5–5.2)
Sodium: 142 mmol/L (ref 134–144)

## 2014-05-25 LAB — NMR, LIPOPROFILE
CHOLESTEROL: 205 mg/dL — AB (ref 100–199)
HDL Cholesterol by NMR: 46 mg/dL (ref >39)
HDL PARTICLE NUMBER: 25.7 umol/L — AB (ref >=30.5)
LDL Particle Number: 1816 nmol/L — ABNORMAL HIGH (ref <1000)
LDL Size: 21 nm (ref >20.5)
LDL-C: 128 mg/dL — ABNORMAL HIGH (ref 0–99)
LP-IR SCORE: 55 — AB (ref <=45)
SMALL LDL PARTICLE NUMBER: 848 nmol/L — AB (ref <=527)
Triglycerides by NMR: 155 mg/dL — ABNORMAL HIGH (ref 0–149)

## 2014-05-25 LAB — THYROID PANEL WITH TSH
Free Thyroxine Index: 3.1 (ref 1.2–4.9)
T3 Uptake Ratio: 26 % (ref 24–39)
T4, Total: 11.9 ug/dL (ref 4.5–12.0)
TSH: 0.339 u[IU]/mL — ABNORMAL LOW (ref 0.450–4.500)

## 2014-05-25 LAB — VITAMIN D 25 HYDROXY (VIT D DEFICIENCY, FRACTURES): Vit D, 25-Hydroxy: 50.7 ng/mL (ref 30.0–100.0)

## 2014-05-25 NOTE — Progress Notes (Signed)
Lab only 

## 2014-05-27 ENCOUNTER — Telehealth: Payer: Self-pay | Admitting: Family Medicine

## 2014-05-27 LAB — FECAL OCCULT BLOOD, IMMUNOCHEMICAL: Fecal Occult Bld: NEGATIVE

## 2014-05-27 NOTE — Telephone Encounter (Signed)
Patient aware of results.

## 2014-06-21 ENCOUNTER — Ambulatory Visit (INDEPENDENT_AMBULATORY_CARE_PROVIDER_SITE_OTHER): Payer: Medicare Other | Admitting: *Deleted

## 2014-06-21 DIAGNOSIS — E538 Deficiency of other specified B group vitamins: Secondary | ICD-10-CM | POA: Diagnosis not present

## 2014-06-21 DIAGNOSIS — D509 Iron deficiency anemia, unspecified: Secondary | ICD-10-CM | POA: Diagnosis not present

## 2014-06-21 NOTE — Patient Instructions (Signed)

## 2014-06-21 NOTE — Progress Notes (Signed)
Vitamin b12 given and tolerated well

## 2014-07-22 ENCOUNTER — Ambulatory Visit (INDEPENDENT_AMBULATORY_CARE_PROVIDER_SITE_OTHER): Payer: Medicare Other | Admitting: *Deleted

## 2014-07-22 DIAGNOSIS — E538 Deficiency of other specified B group vitamins: Secondary | ICD-10-CM | POA: Diagnosis not present

## 2014-07-22 DIAGNOSIS — D509 Iron deficiency anemia, unspecified: Secondary | ICD-10-CM | POA: Diagnosis not present

## 2014-07-22 NOTE — Patient Instructions (Signed)

## 2014-07-22 NOTE — Progress Notes (Signed)
Vitamin b12 injection given and tolerated well.  

## 2014-08-23 ENCOUNTER — Ambulatory Visit (INDEPENDENT_AMBULATORY_CARE_PROVIDER_SITE_OTHER): Payer: Medicare Other | Admitting: *Deleted

## 2014-08-23 DIAGNOSIS — D509 Iron deficiency anemia, unspecified: Secondary | ICD-10-CM | POA: Diagnosis not present

## 2014-08-23 DIAGNOSIS — E538 Deficiency of other specified B group vitamins: Secondary | ICD-10-CM

## 2014-08-23 NOTE — Progress Notes (Signed)
Vitamin B12 injection given and tolerated well   

## 2014-08-23 NOTE — Patient Instructions (Signed)

## 2014-09-23 ENCOUNTER — Ambulatory Visit (INDEPENDENT_AMBULATORY_CARE_PROVIDER_SITE_OTHER): Payer: Medicare Other | Admitting: *Deleted

## 2014-09-23 DIAGNOSIS — E538 Deficiency of other specified B group vitamins: Secondary | ICD-10-CM

## 2014-09-23 DIAGNOSIS — D509 Iron deficiency anemia, unspecified: Secondary | ICD-10-CM

## 2014-09-23 NOTE — Progress Notes (Signed)
Vitamin b12 injection given and tolerated well.  

## 2014-09-23 NOTE — Patient Instructions (Signed)

## 2014-10-01 ENCOUNTER — Ambulatory Visit (INDEPENDENT_AMBULATORY_CARE_PROVIDER_SITE_OTHER): Payer: Medicare Other | Admitting: Family Medicine

## 2014-10-01 ENCOUNTER — Encounter: Payer: Self-pay | Admitting: Family Medicine

## 2014-10-01 VITALS — BP 139/72 | HR 81 | Temp 97.0°F | Ht 66.0 in | Wt 155.4 lb

## 2014-10-01 DIAGNOSIS — E559 Vitamin D deficiency, unspecified: Secondary | ICD-10-CM

## 2014-10-01 DIAGNOSIS — J441 Chronic obstructive pulmonary disease with (acute) exacerbation: Secondary | ICD-10-CM

## 2014-10-01 DIAGNOSIS — E039 Hypothyroidism, unspecified: Secondary | ICD-10-CM

## 2014-10-01 DIAGNOSIS — E785 Hyperlipidemia, unspecified: Secondary | ICD-10-CM | POA: Diagnosis not present

## 2014-10-01 DIAGNOSIS — R195 Other fecal abnormalities: Secondary | ICD-10-CM

## 2014-10-01 DIAGNOSIS — J209 Acute bronchitis, unspecified: Secondary | ICD-10-CM

## 2014-10-01 DIAGNOSIS — J440 Chronic obstructive pulmonary disease with acute lower respiratory infection: Secondary | ICD-10-CM

## 2014-10-01 DIAGNOSIS — I10 Essential (primary) hypertension: Secondary | ICD-10-CM | POA: Diagnosis not present

## 2014-10-01 LAB — POCT CBC
Granulocyte percent: 70.3 %G (ref 37–80)
HEMATOCRIT: 45.8 % (ref 37.7–47.9)
Hemoglobin: 15.1 g/dL (ref 12.2–16.2)
LYMPH, POC: 2.4 (ref 0.6–3.4)
MCH: 27.1 pg (ref 27–31.2)
MCHC: 32.9 g/dL (ref 31.8–35.4)
MCV: 82.5 fL (ref 80–97)
MPV: 8.2 fL (ref 0–99.8)
POC Granulocyte: 6.4 (ref 2–6.9)
POC LYMPH %: 26.4 % (ref 10–50)
Platelet Count, POC: 198 10*3/uL (ref 142–424)
RBC: 5.55 M/uL — AB (ref 4.04–5.48)
RDW, POC: 15 %
WBC: 9.1 10*3/uL (ref 4.6–10.2)

## 2014-10-01 NOTE — Patient Instructions (Addendum)
Continue current medications. Continue good therapeutic lifestyle changes which include good diet and exercise. Fall precautions discussed with patient. If an FOBT was given today- please return it to our front desk. If you are over 79 years old - you may need Prevnar 73 or the adult Pneumonia vaccine.  Flu Shots will be available at our office starting mid- September. Please call and schedule a FLU CLINIC APPOINTMENT.                       Medicare Annual Wellness Visit  [facility] and the medical providers at [facility] strive to bring you the best medical care.  In doing so we not only want to address your current medical conditions and concerns but also to detect new conditions early and prevent illness, disease and health-related problems.    Medicare offers a yearly Wellness Visit which allows our clinical staff to assess your need for preventative services including immunizations, lifestyle education, counseling to decrease risk of preventable diseases and screening for fall risk and other medical concerns.    This visit is provided free of charge (no copay) for all Medicare recipients. The clinical pharmacists at [facility] have begun to conduct these Wellness Visits which will also include a thorough review of all your medications.    As you primary medical provider recommend that you make an appointment for your Annual Wellness Visit if you have not done so already this year.  You may set up this appointment before you leave today or you may call back ([phone]) and schedule an appointment.  Please make sure when you call that you mention that you are scheduling your Annual Wellness Visit with the clinical pharmacist so that the appointment may be made for the proper length of time.    The patient should continue to make all efforts possible to try to stop smoking When she runs out of of the Pulmicort nebulizer, she can try the Pulmicort  inhaler and do 2 puffs twice daily and rinse mouth after using. The patient was giving to sample inhalers today to try. She also is in need of getting a mammogram and this will be scheduled as she leaves today. She will be scheduled to return to have 2 irritated docusate related nevi on her chest wall removed. She should not forget to get her flu shot this fall She is also in need of getting an eye exam and the should be scheduled as soon as possible She should continue to be careful and not put herself at risk for falling  Mammogram Appointment on 10/13/2014 @ 3:15pm

## 2014-10-01 NOTE — Progress Notes (Signed)
Subjective:    [name], female    DOB: 11/14/1931, 81 y.o.   MRN: [id number]  HPI Patient is here today for a follow up on her chronic medical problems. She also has 2 moles on the right side of her chest she would like looked at. The patient has a history of COPD, hypertension, hyperlipidemia and hypothyroidism. She comes to the visit today with her daughter. The patient denies chest pain or shortness of breath. She is doing well currently with her current breathing treatment but unfortunately will no longer be held to get the Pulmicort nebulizer from the health department. She is also having to switch today excellent from Nexium and we reassured her that this should be fine. She's not had any problems with her GI tract, trouble with swallowing heartburn indigestion and nausea vomiting diarrhea blood in the stool or black tarry bowel movements. She is also having no trouble passing her water.   Review of Systems  Constitutional: Negative.   HENT: Negative.   Eyes: Negative.   Respiratory: Negative.   Cardiovascular: Negative.   Endocrine: Negative.   Genitourinary: Negative.   Musculoskeletal: Negative.   Skin:       Patient has 2 moles on right side of her chest.  Allergic/Immunologic: Negative.   Neurological: Negative.   Hematological: Negative.   Psychiatric/Behavioral: Negative.            Patient Active Problem List   Diagnosis Date Noted  . Peripheral vascular insufficiency 09/21/2013  . Hip pain 09/21/2013  . Osteoporosis, senile 07/15/2013  . Vitamin D deficiency   . Iron deficiency 08/22/2010  . GERD (gastroesophageal reflux disease) 08/22/2010  . Guaiac positive stools 08/22/2010  . BOWEN'S DISEASE 12/24/2008  . Hypothyroidism 12/24/2008  . HEMORRHOIDS 12/24/2008  . RECTAL BLEEDING 12/24/2008  . ARTHRITIS 12/24/2008  . NEOPLASM, MALIGNANT, ANAL CANAL, SQUAMOUS CELL 12/23/2008  . HYPERLIPIDEMIA 12/23/2008  . CARDIOMYOPATHY 12/23/2008    . CHF 12/23/2008  . COPD 12/23/2008  . HIATAL HERNIA 12/23/2008  . DIVERTICULOSIS, COLON 12/23/2008  . RENAL CYST 12/23/2008   Outpatient Encounter Prescriptions as of 10/01/2014  Medication Sig  . albuterol (PROVENTIL HFA;VENTOLIN HFA) 108 (90 BASE) MCG/ACT inhaler Inhale 1 puff into the lungs as needed.  Marland Kitchen albuterol (PROVENTIL) (2.5 MG/3ML) 0.083% nebulizer solution Take 3 mLs (2.5 mg total) by nebulization every 6 (six) hours as needed for wheezing.  . budesonide (PULMICORT) 0.5 MG/2ML nebulizer solution Take 2 mLs (0.5 mg total) by nebulization 2 (two) times daily.  . Cholecalciferol (VITAMIN D3) 1000 UNITS tablet Take 2,000 Units by mouth daily.   . cyanocobalamin (,VITAMIN B-12,) 1000 MCG/ML injection Inject 1,000 mcg into the muscle every 30 (thirty) days.   . hydrochlorothiazide (HYDRODIURIL) 25 MG tablet TAKE ONE TABLET BY MOUTH ONCE DAILY  . levothyroxine (SYNTHROID, LEVOTHROID) 112 MCG tablet Take 112 mcg by mouth daily.  . meclizine (ANTIVERT) 32 MG tablet Take 1 tablet (32 mg total) by mouth 3 (three) times daily as needed.  Marland Kitchen omega-3 acid ethyl esters (LOVAZA) 1 G capsule Take 1 g by mouth daily.   . Pitavastatin Calcium (LIVALO) 4 MG TABS Take 1 tablet by mouth daily.   . [DISCONTINUED] esomeprazole (NEXIUM) 40 MG capsule Take 40 mg by mouth daily before breakfast.     Facility-Administered Encounter Medications as of 10/01/2014  Medication  . cyanocobalamin ((VITAMIN B-12)) injection 1,000 mcg      Objective:   Physical Exam  Constitutional: She is oriented to  person, place, and time. She appears well-developed and well-nourished. No distress.  The patient is calm and alert  HENT:  Head: Normocephalic and atraumatic.  Nose: Nose normal.  Mouth/Throat: Oropharynx is clear and moist. No oropharyngeal exudate.  There is a small amount ear cerumen bilaterally  Eyes: Conjunctivae and EOM are normal. Pupils are equal, round, and reactive to light. Right eye exhibits no  discharge. Left eye exhibits no discharge. No scleral icterus.  Neck: Normal range of motion. Neck supple. No thyromegaly present.  No carotid bruits anterior cervical adenopathy or thyromegaly  Cardiovascular: Normal rate, regular rhythm, normal heart sounds and intact distal pulses.   No murmur heard. Distal pulses were present but difficult to palpate. The rhythm is regular at 72/m  Pulmonary/Chest: Effort normal and breath sounds normal. No respiratory distress. She has no wheezes. She has no rales. She exhibits no tenderness.  Lungs were clear anteriorly and posteriorly  Abdominal: Soft. Bowel sounds are normal. She exhibits no mass. There is no tenderness. There is no rebound and no guarding.  Nontender without organ enlargement or bruits  Musculoskeletal: Normal range of motion. She exhibits no edema or tenderness.  Lymphadenopathy:    She has no cervical adenopathy.  Neurological: She is alert and oriented to person, place, and time. She has normal reflexes. No cranial nerve deficit.  Skin: Skin is warm and dry. No rash noted.  The patient has 2 large right anterior chest wall pedunculated nevi which are irritated and need removal and she'll be scheduled to return to the clinic for this.  Psychiatric: She has a normal mood and affect. Her behavior is normal. Judgment and thought content normal.  Nursing note and vitals reviewed.   BP 139/72 mmHg  Pulse 81  Temp(Src) 97 F (36.1 C) (Oral)  Ht '5\' 6"'$  (1.676 m)  Wt 155 lb 6.4 oz (70.489 kg)  BMI 25.09 kg/m2       Assessment & Plan:  1. Hyperlipemia -The patient should continue her current treatment pending results of lab work  2. Essential hypertension -Her blood pressure is good today and she should continue with current treatment  3. Vitamin D deficiency -The vitamin D levels have been stable until lab work is returned she should continue with current treatment  4. COPD (chronic obstructive pulmonary disease) with acute  bronchitis -Her COPD is stable and she has no significant shortness of breath with her current regimen. This is changing since she is going to be running out of the Pulmicort nebulizer and were hoping that with the Pulmicort inhaler that she can maintain her current breathing status.  5. Hypothyroidism, unspecified hypothyroidism type -She should continue with current treatment pending results of lab work  6. Guaiac positive stools -The last FOBT was negative and she should continue to watch for blood in the stool.  No orders of the defined types were placed in this encounter.   Patient Instructions  Continue current medications. Continue good therapeutic lifestyle changes which include good diet and exercise. Fall precautions discussed with patient. If an FOBT was given today- please return it to our front desk. If you are over 65 years old - you may need Prevnar 8 or the adult Pneumonia vaccine.  Flu Shots will be available at our office starting mid- September. Please call and schedule a FLU CLINIC APPOINTMENT.                       Medicare Annual Wellness Visit  [facility] and the medical providers at [facility] strive to bring you the best medical care.  In doing so we not only want to address your current medical conditions and concerns but also to detect new conditions early and prevent illness, disease and health-related problems.    Medicare offers a yearly Wellness Visit which allows our clinical staff to assess your need for preventative services including immunizations, lifestyle education, counseling to decrease risk of preventable diseases and screening for fall risk and other medical concerns.    This visit is provided free of charge (no copay) for all Medicare recipients. The clinical pharmacists at [facility] have begun to conduct these Wellness Visits which will also include a thorough review of all your medications.      As you primary medical provider recommend that you make an appointment for your Annual Wellness Visit if you have not done so already this year.  You may set up this appointment before you leave today or you may call back ([phone]) and schedule an appointment.  Please make sure when you call that you mention that you are scheduling your Annual Wellness Visit with the clinical pharmacist so that the appointment may be made for the proper length of time.    The patient should continue to make all efforts possible to try to stop smoking When she runs out of of the Pulmicort nebulizer, she can try the Pulmicort inhaler and do 2 puffs twice daily and rinse mouth after using. The patient was giving to sample inhalers today to try. She also is in need of getting a mammogram and this will be scheduled as she leaves today. She will be scheduled to return to have 2 irritated docusate related nevi on her chest wall removed. She should not forget to get her flu shot this fall She is also in need of getting an eye exam and the should be scheduled as soon as possible She should continue to be careful and not put herself at risk for falling   Arrie Senate MD

## 2014-10-01 NOTE — Addendum Note (Signed)
Addended by: Selmer Dominion on: 10/01/2014 11:09 AM   Modules accepted: Orders

## 2014-10-02 LAB — NMR, LIPOPROFILE
Cholesterol: 186 mg/dL (ref 100–199)
HDL Cholesterol by NMR: 39 mg/dL — ABNORMAL LOW (ref >39)
HDL Particle Number: 25.3 umol/L — ABNORMAL LOW (ref >=30.5)
LDL Particle Number: 1416 nmol/L — ABNORMAL HIGH (ref <1000)
LDL Size: 21.1 nm (ref >20.5)
LDL-C: 108 mg/dL — ABNORMAL HIGH (ref 0–99)
LP-IR SCORE: 73 — AB (ref <=45)
SMALL LDL PARTICLE NUMBER: 466 nmol/L (ref <=527)
TRIGLYCERIDES BY NMR: 195 mg/dL — AB (ref 0–149)

## 2014-10-02 LAB — BMP8+EGFR
BUN/Creatinine Ratio: 27 — ABNORMAL HIGH (ref 11–26)
BUN: 21 mg/dL (ref 8–27)
CALCIUM: 9.5 mg/dL (ref 8.7–10.3)
CO2: 27 mmol/L (ref 18–29)
Chloride: 96 mmol/L — ABNORMAL LOW (ref 97–108)
Creatinine, Ser: 0.79 mg/dL (ref 0.57–1.00)
GFR calc Af Amer: 81 mL/min/{1.73_m2} (ref >59)
GFR calc non Af Amer: 70 mL/min/{1.73_m2} (ref >59)
Glucose: 109 mg/dL — ABNORMAL HIGH (ref 65–99)
POTASSIUM: 3.8 mmol/L (ref 3.5–5.2)
Sodium: 143 mmol/L (ref 134–144)

## 2014-10-02 LAB — HEPATIC FUNCTION PANEL
ALT: 14 IU/L (ref 0–32)
AST: 22 IU/L (ref 0–40)
Albumin: 4.2 g/dL (ref 3.5–4.7)
Alkaline Phosphatase: 58 IU/L (ref 39–117)
Bilirubin Total: 0.4 mg/dL (ref 0.0–1.2)
Bilirubin, Direct: 0.11 mg/dL (ref 0.00–0.40)
TOTAL PROTEIN: 6.7 g/dL (ref 6.0–8.5)

## 2014-10-02 LAB — VITAMIN D 25 HYDROXY (VIT D DEFICIENCY, FRACTURES): VIT D 25 HYDROXY: 55 ng/mL (ref 30.0–100.0)

## 2014-10-06 ENCOUNTER — Ambulatory Visit (INDEPENDENT_AMBULATORY_CARE_PROVIDER_SITE_OTHER): Payer: Medicare Other | Admitting: Physician Assistant

## 2014-10-06 ENCOUNTER — Encounter: Payer: Self-pay | Admitting: Physician Assistant

## 2014-10-06 VITALS — Temp 96.8°F | Ht 66.0 in | Wt 156.0 lb

## 2014-10-06 DIAGNOSIS — D485 Neoplasm of uncertain behavior of skin: Secondary | ICD-10-CM

## 2014-10-06 NOTE — Progress Notes (Signed)
   Subjective:    [name], female    DOB: [birth year], 81 y.o.   MRN: [id number]  HPI 81 y/o female present s with enlarging  Lesions on chest and itching lesion on RLE. She has no h/o skin cancer. Has seen Dr. Denna Haggard in the past but has been several years.     Review of Systems  Skin:       Enlarging lesions on chest, irritated by her necklace .70m in diameter and <.569min diameter. Negative for pain but occasionally bleeds d/t irritation and inflammation from jewelry and clothing        Objective:   Physical Exam  Skin:  Hyperkeratotic skin lesions on chest and RLE. Surrounding erythema,.Flesh colored to dark in appearance.           Assessment & Plan:  1. Neoplasm of uncertain behavior of skin Lesions on chest(<1cm) and RLE (1cm) were biopsied to r/o dysplasia vs BCC/SCC. Will await pathology and notify patient accordingly  - Pathology Report - Pathology Report     Keano Guggenheim A. GaBenjamin StainA-C

## 2014-10-12 LAB — PATHOLOGY

## 2014-10-18 ENCOUNTER — Ambulatory Visit: Payer: Medicare Other | Admitting: Physician Assistant

## 2014-10-26 ENCOUNTER — Ambulatory Visit (INDEPENDENT_AMBULATORY_CARE_PROVIDER_SITE_OTHER): Payer: Medicare Other | Admitting: *Deleted

## 2014-10-26 DIAGNOSIS — E538 Deficiency of other specified B group vitamins: Secondary | ICD-10-CM | POA: Diagnosis not present

## 2014-10-26 DIAGNOSIS — D509 Iron deficiency anemia, unspecified: Secondary | ICD-10-CM | POA: Diagnosis not present

## 2014-10-26 NOTE — Patient Instructions (Signed)

## 2014-11-24 ENCOUNTER — Other Ambulatory Visit: Payer: Self-pay | Admitting: Family Medicine

## 2014-11-29 ENCOUNTER — Encounter: Payer: Self-pay | Admitting: Family Medicine

## 2014-11-30 ENCOUNTER — Ambulatory Visit (INDEPENDENT_AMBULATORY_CARE_PROVIDER_SITE_OTHER): Payer: Medicare Other | Admitting: *Deleted

## 2014-11-30 DIAGNOSIS — D509 Iron deficiency anemia, unspecified: Secondary | ICD-10-CM

## 2014-11-30 DIAGNOSIS — E538 Deficiency of other specified B group vitamins: Secondary | ICD-10-CM

## 2014-11-30 NOTE — Progress Notes (Signed)
Pt given B12 injection IM right deltoid and tolerated well.

## 2014-11-30 NOTE — Patient Instructions (Signed)

## 2014-12-27 ENCOUNTER — Telehealth: Payer: Self-pay | Admitting: Family Medicine

## 2014-12-27 NOTE — Telephone Encounter (Signed)
Stp and advised her to have Denyse Amass call Barnett Applebaum here in our office and we would get it taken care of. Pt voiced understanding.

## 2014-12-27 NOTE — Telephone Encounter (Signed)
Stp and she states we changed her livalo from '2mg'$  to '4mg'$  and I can see in her current med list where it says she is taking '4mg'$ . [facility]yse Amass at [facility] gets her Livalo for her and she said she had faxed a form over a week or so ago to have filled out and signed. Advised pt I would check on this and see what needs to be done.

## 2014-12-31 ENCOUNTER — Ambulatory Visit: Payer: Medicare Other

## 2015-01-04 ENCOUNTER — Ambulatory Visit (INDEPENDENT_AMBULATORY_CARE_PROVIDER_SITE_OTHER): Payer: Medicare Other | Admitting: *Deleted

## 2015-01-04 DIAGNOSIS — D509 Iron deficiency anemia, unspecified: Secondary | ICD-10-CM

## 2015-01-04 DIAGNOSIS — Z23 Encounter for immunization: Secondary | ICD-10-CM | POA: Diagnosis not present

## 2015-01-04 DIAGNOSIS — E538 Deficiency of other specified B group vitamins: Secondary | ICD-10-CM

## 2015-01-04 NOTE — Progress Notes (Signed)
Pt given B12 and flu shots and tolerated well.

## 2015-02-05 ENCOUNTER — Ambulatory Visit (INDEPENDENT_AMBULATORY_CARE_PROVIDER_SITE_OTHER): Payer: Medicare Other | Admitting: Family Medicine

## 2015-02-05 VITALS — BP 139/73 | HR 71 | Temp 97.2°F | Ht 66.0 in | Wt 154.8 lb

## 2015-02-05 DIAGNOSIS — J441 Chronic obstructive pulmonary disease with (acute) exacerbation: Secondary | ICD-10-CM | POA: Diagnosis not present

## 2015-02-05 MED ORDER — AZITHROMYCIN 250 MG PO TABS: ORAL_TABLET | ORAL | Status: DC

## 2015-02-05 MED ORDER — PREDNISONE 20 MG PO TABS: 40.0000 mg | ORAL_TABLET | Freq: Every day | ORAL | Status: DC

## 2015-02-05 NOTE — Progress Notes (Signed)
   HPI  Patient presents today year with increased cough.  Patient haslong-standing COPD and is on Pulmicort twice a day. She is here with increased cough slight shortness of breath, and chest tightness consistent with usual COPD exacerbation over the last day or so. She also has malaise, body aches She denies any trouble tolerating food or fluidsor profuse dyspnea. She had severe persistent cough that is very unusual last night.  PMH: Smoking status noted ROS: Per HPI  Objective: BP 139/73 mmHg  Pulse 71  Temp(Src) 97.2 F (36.2 C)  Ht '5\' 6"'$  (1.676 m)  Wt 154 lb 12.8 oz (70.217 kg)  BMI 25.00 kg/m2  SpO2 93% Gen: NAD, alert, cooperative with exam HEENT: NCAT, TMs normal bilaterallyexcept for some scarring on the right,oropharynx clear CV: RRR, good S1/S2, no murmur Resp: CTABL, no wheezes, non-labored Ext: No edema, warm Neuro: Alert and oriented, No gross deficits  Assessment and plan:  # COPD exacerbation Early developing exacerbation, treat aggressively with azithro and prednisone considering her poor  Baseline pulmonary function RTC if worsening or fails to improve.    Meds ordered this encounter  Medications  . azithromycin (ZITHROMAX) 250 MG tablet    Sig: Take 2 tablets on day 1 and 1 tablet daily after that    Dispense:  6 tablet    Refill:  0  . predniSONE (DELTASONE) 20 MG tablet    Sig: Take 2 tablets (40 mg total) by mouth daily with breakfast.    Dispense:  10 tablet    Refill:  0    Laroy Apple, MD [facility] 02/05/2015, 10:13 AM

## 2015-02-05 NOTE — Patient Instructions (Addendum)
Very noice to meet you!  Chronic Obstructive Pulmonary Disease Chronic obstructive pulmonary disease (COPD) is a common lung condition in which airflow from the lungs is limited. COPD is a general term that can be used to describe many different lung problems that limit airflow, including both chronic bronchitis and emphysema. If you have COPD, your lung function will probably never return to normal, but there are measures you can take to improve lung function and make yourself feel better. CAUSES   Smoking (common).  Exposure to secondhand smoke.  Genetic problems.  Chronic inflammatory lung diseases or recurrent infections. SYMPTOMS  Shortness of breath, especially with physical activity.  Deep, persistent (chronic) cough with a large amount of thick mucus.  Wheezing.  Rapid breaths (tachypnea).  Gray or bluish discoloration (cyanosis) of the skin, especially in your fingers, toes, or lips.  Fatigue.  Weight loss.  Frequent infections or episodes when breathing symptoms become much worse (exacerbations).  Chest tightness. DIAGNOSIS Your health care provider will take a medical history and perform a physical examination to diagnose COPD. Additional tests for COPD may include:  Lung (pulmonary) function tests.  Chest X-ray.  CT scan.  Blood tests. TREATMENT  Treatment for COPD may include:  Inhaler and nebulizer medicines. These help manage the symptoms of COPD and make your breathing more comfortable.  Supplemental oxygen. Supplemental oxygen is only helpful if you have a low oxygen level in your blood.  Exercise and physical activity. These are beneficial for nearly all people with COPD.  Lung surgery or transplant.  Nutrition therapy to gain weight, if you are underweight.  Pulmonary rehabilitation. This may involve working with a team of health care providers and specialists, such as respiratory, occupational, and physical therapists. HOME CARE  INSTRUCTIONS  Take all medicines (inhaled or pills) as directed by your health care provider.  Avoid over-the-counter medicines or cough syrups that dry up your airway (such as antihistamines) and slow down the elimination of secretions unless instructed otherwise by your health care provider.  If you are a smoker, the most important thing that you can do is stop smoking. Continuing to smoke will cause further lung damage and breathing trouble. Ask your health care provider for help with quitting smoking. He or she can direct you to community resources or hospitals that provide support.  Avoid exposure to irritants such as smoke, chemicals, and fumes that aggravate your breathing.  Use oxygen therapy and pulmonary rehabilitation if directed by your health care provider. If you require home oxygen therapy, ask your health care provider whether you should purchase a pulse oximeter to measure your oxygen level at home.  Avoid contact with individuals who have a contagious illness.  Avoid extreme temperature and humidity changes.  Eat healthy foods. Eating smaller, more frequent meals and resting before meals may help you maintain your strength.  Stay active, but balance activity with periods of rest. Exercise and physical activity will help you maintain your ability to do things you want to do.  Preventing infection and hospitalization is very important when you have COPD. Make sure to receive all the vaccines your health care provider recommends, especially the pneumococcal and influenza vaccines. Ask your health care provider whether you need a pneumonia vaccine.  Learn and use relaxation techniques to manage stress.  Learn and use controlled breathing techniques as directed by your health care provider. Controlled breathing techniques include:  Pursed lip breathing. Start by breathing in (inhaling) through your nose for 1 second. Then, purse  your lips as if you were going to whistle and  breathe out (exhale) through the pursed lips for 2 seconds.  Diaphragmatic breathing. Start by putting one hand on your abdomen just above your waist. Inhale slowly through your nose. The hand on your abdomen should move out. Then purse your lips and exhale slowly. You should be able to feel the hand on your abdomen moving in as you exhale.  Learn and use controlled coughing to clear mucus from your lungs. Controlled coughing is a series of short, progressive coughs. The steps of controlled coughing are: 1. Lean your head slightly forward. 2. Breathe in deeply using diaphragmatic breathing. 3. Try to hold your breath for 3 seconds. 4. Keep your mouth slightly open while coughing twice. 5. Spit any mucus out into a tissue. 6. Rest and repeat the steps once or twice as needed. SEEK MEDICAL CARE IF:  You are coughing up more mucus than usual.  There is a change in the color or thickness of your mucus.  Your breathing is more labored than usual.  Your breathing is faster than usual. SEEK IMMEDIATE MEDICAL CARE IF:  You have shortness of breath while you are resting.  You have shortness of breath that prevents you from:  Being able to talk.  Performing your usual physical activities.  You have chest pain lasting longer than 5 minutes.  Your skin color is more cyanotic than usual.  You measure low oxygen saturations for longer than 5 minutes with a pulse oximeter. MAKE SURE YOU:  Understand these instructions.  Will watch your condition.  Will get help right away if you are not doing well or get worse.   This information is not intended to replace advice given to you by your health care provider. Make sure you discuss any questions you have with your health care provider.   Document Released: 12/13/2004 Document Revised: 03/26/2014 Document Reviewed: 10/30/2012 Elsevier Interactive Patient Education [employer].

## 2015-02-08 ENCOUNTER — Ambulatory Visit (INDEPENDENT_AMBULATORY_CARE_PROVIDER_SITE_OTHER): Payer: Medicare Other | Admitting: *Deleted

## 2015-02-08 DIAGNOSIS — D509 Iron deficiency anemia, unspecified: Secondary | ICD-10-CM | POA: Diagnosis not present

## 2015-02-08 DIAGNOSIS — E538 Deficiency of other specified B group vitamins: Secondary | ICD-10-CM

## 2015-02-08 NOTE — Progress Notes (Signed)
Pt given B12 injection IM right deltoid and tolerated well.

## 2015-02-08 NOTE — Patient Instructions (Signed)

## 2015-02-25 ENCOUNTER — Ambulatory Visit (INDEPENDENT_AMBULATORY_CARE_PROVIDER_SITE_OTHER): Payer: Medicare Other | Admitting: Family Medicine

## 2015-02-25 ENCOUNTER — Encounter: Payer: Self-pay | Admitting: Family Medicine

## 2015-02-25 VITALS — BP 126/60 | HR 80 | Temp 97.3°F | Ht 66.0 in | Wt 155.0 lb

## 2015-02-25 DIAGNOSIS — E039 Hypothyroidism, unspecified: Secondary | ICD-10-CM

## 2015-02-25 DIAGNOSIS — J209 Acute bronchitis, unspecified: Secondary | ICD-10-CM

## 2015-02-25 DIAGNOSIS — I10 Essential (primary) hypertension: Secondary | ICD-10-CM

## 2015-02-25 DIAGNOSIS — J449 Chronic obstructive pulmonary disease, unspecified: Secondary | ICD-10-CM

## 2015-02-25 DIAGNOSIS — E785 Hyperlipidemia, unspecified: Secondary | ICD-10-CM | POA: Diagnosis not present

## 2015-02-25 DIAGNOSIS — I739 Peripheral vascular disease, unspecified: Secondary | ICD-10-CM

## 2015-02-25 DIAGNOSIS — J440 Chronic obstructive pulmonary disease with acute lower respiratory infection: Secondary | ICD-10-CM

## 2015-02-25 DIAGNOSIS — C211 Malignant neoplasm of anal canal: Secondary | ICD-10-CM

## 2015-02-25 DIAGNOSIS — E538 Deficiency of other specified B group vitamins: Secondary | ICD-10-CM

## 2015-02-25 DIAGNOSIS — I509 Heart failure, unspecified: Secondary | ICD-10-CM | POA: Diagnosis not present

## 2015-02-25 DIAGNOSIS — E038 Other specified hypothyroidism: Secondary | ICD-10-CM

## 2015-02-25 DIAGNOSIS — E559 Vitamin D deficiency, unspecified: Secondary | ICD-10-CM | POA: Diagnosis not present

## 2015-02-25 DIAGNOSIS — I428 Other cardiomyopathies: Secondary | ICD-10-CM | POA: Diagnosis not present

## 2015-02-25 DIAGNOSIS — K219 Gastro-esophageal reflux disease without esophagitis: Secondary | ICD-10-CM | POA: Diagnosis not present

## 2015-02-25 DIAGNOSIS — I429 Cardiomyopathy, unspecified: Secondary | ICD-10-CM

## 2015-02-25 DIAGNOSIS — E034 Atrophy of thyroid (acquired): Secondary | ICD-10-CM

## 2015-02-25 MED ORDER — ALBUTEROL SULFATE (2.5 MG/3ML) 0.083% IN NEBU: INHALATION_SOLUTION | RESPIRATORY_TRACT | Status: DC

## 2015-02-25 MED ORDER — HYDROCHLOROTHIAZIDE 25 MG PO TABS: ORAL_TABLET | ORAL | Status: DC

## 2015-02-25 NOTE — Patient Instructions (Addendum)
Medicare Annual Wellness Visit  [facility] and the medical providers at [facility] strive to bring you the best medical care.  In doing so we not only want to address your current medical conditions and concerns but also to detect new conditions early and prevent illness, disease and health-related problems.    Medicare offers a yearly Wellness Visit which allows our clinical staff to assess your need for preventative services including immunizations, lifestyle education, counseling to decrease risk of preventable diseases and screening for fall risk and other medical concerns.    This visit is provided free of charge (no copay) for all Medicare recipients. The clinical pharmacists at [facility] have begun to conduct these Wellness Visits which will also include a thorough review of all your medications.    As you primary medical provider recommend that you make an appointment for your Annual Wellness Visit if you have not done so already this year.  You may set up this appointment before you leave today or you may call back ([phone]) and schedule an appointment.  Please make sure when you call that you mention that you are scheduling your Annual Wellness Visit with the clinical pharmacist so that the appointment may be made for the proper length of time.     Continue current medications. Continue good therapeutic lifestyle changes which include good diet and exercise. Fall precautions discussed with patient. If an FOBT was given today- please return it to our front desk. If you are over 74 years old - you may need Prevnar 19 or the adult Pneumonia vaccine.  **Flu shots are available--- please call and schedule a FLU-CLINIC appointment**  After your visit with [place] today you will receive a survey in the mail or online from [employer] regarding your care with [place]. Please take a moment to fill this out. Your feedback is very  important to [place] as you can help [place] better understand your patient needs as well as improve your experience and satisfaction. WE CARE ABOUT YOU!!!   The patient should make every effort to start walking more to improve the circulation to her lower extremities The patient should stay well hydrated this winter and drink plenty of fluids and keep the house as cool as possible She should use nasal saline regularly and nasal saline gel at bedtime

## 2015-02-25 NOTE — Progress Notes (Signed)
Subjective:    [name], female    DOB: 08/22/1931, 81 y.o.   MRN: [id number]  HPI Pt here for follow up and management of chronic medical problems which includes hypothyroid, hyperlipidemia, COPD,and GERD. She is taking medications regularly. The patient comes to the visit today with her daughter. She is requesting 2 refills today. She refuses to do Pap smears. She had her mammogram done in August and a DEXA scan done in April 2015. She has had her flu shot and Prevnar and we'll get routine lab work today. The patient denies chest pain shortness of breath trouble swallowing heartburn indigestion nausea vomiting or diarrhea. She's not had any blood in her stool. She is taking iron so her stools are dark colored. She is passing her water without problems. She does not have any claudication symptoms.    Patient Active Problem List   Diagnosis Date Noted  . Peripheral vascular insufficiency ([place]) 09/21/2013  . Hip pain 09/21/2013  . Osteoporosis, senile 07/15/2013  . Vitamin D deficiency   . Iron deficiency 08/22/2010  . GERD (gastroesophageal reflux disease) 08/22/2010  . Guaiac positive stools 08/22/2010  . BOWEN'S DISEASE 12/24/2008  . Hypothyroidism 12/24/2008  . HEMORRHOIDS 12/24/2008  . RECTAL BLEEDING 12/24/2008  . ARTHRITIS 12/24/2008  . NEOPLASM, MALIGNANT, ANAL CANAL, SQUAMOUS CELL 12/23/2008  . HYPERLIPIDEMIA 12/23/2008  . CARDIOMYOPATHY 12/23/2008  . CHF 12/23/2008  . COPD 12/23/2008  . HIATAL HERNIA 12/23/2008  . DIVERTICULOSIS, COLON 12/23/2008  . RENAL CYST 12/23/2008   Outpatient Encounter Prescriptions as of 02/25/2015  Medication Sig  . albuterol (PROVENTIL HFA;VENTOLIN HFA) 108 (90 BASE) MCG/ACT inhaler Inhale 1 puff into the lungs as needed.  Marland Kitchen albuterol (PROVENTIL) (2.5 MG/3ML) 0.083% nebulizer solution USE ONE VIAL IN NEBULIZER EVERY 6 HOURS AS NEEDED FOR WHEEZING  . budesonide (PULMICORT) 0.5 MG/2ML nebulizer solution Take 2 mLs (0.5 mg  total) by nebulization 2 (two) times daily.  . Cholecalciferol (VITAMIN D3) 1000 UNITS tablet Take 2,000 Units by mouth daily.   . cyanocobalamin (,VITAMIN B-12,) 1000 MCG/ML injection Inject 1,000 mcg into the muscle every 30 (thirty) days.   . hydrochlorothiazide (HYDRODIURIL) 25 MG tablet TAKE ONE TABLET BY MOUTH ONCE DAILY  . levothyroxine (SYNTHROID, LEVOTHROID) 112 MCG tablet Take 112 mcg by mouth daily.  . meclizine (ANTIVERT) 32 MG tablet Take 1 tablet (32 mg total) by mouth 3 (three) times daily as needed.  Marland Kitchen omega-3 acid ethyl esters (LOVAZA) 1 G capsule Take 1 g by mouth daily.   . Pitavastatin Calcium (LIVALO) 4 MG TABS Take 1 tablet by mouth daily.   . predniSONE (DELTASONE) 20 MG tablet Take 2 tablets (40 mg total) by mouth daily with breakfast.  . [DISCONTINUED] albuterol (PROVENTIL) (2.5 MG/3ML) 0.083% nebulizer solution USE ONE VIAL IN NEBULIZER EVERY 6 HOURS AS NEEDED FOR WHEEZING  . [DISCONTINUED] hydrochlorothiazide (HYDRODIURIL) 25 MG tablet TAKE ONE TABLET BY MOUTH ONCE DAILY  . [DISCONTINUED] azithromycin (ZITHROMAX) 250 MG tablet Take 2 tablets on day 1 and 1 tablet daily after that   Facility-Administered Encounter Medications as of 02/25/2015  Medication  . cyanocobalamin ((VITAMIN B-12)) injection 1,000 mcg      Review of Systems  Constitutional: Negative.   HENT: Negative.   Eyes: Negative.   Respiratory: Negative.   Cardiovascular: Negative.   Gastrointestinal: Negative.   Endocrine: Negative.   Genitourinary: Negative.   Musculoskeletal: Negative.   Skin: Negative.   Allergic/Immunologic: Negative.   Neurological: Negative.   Hematological: Negative.  Psychiatric/Behavioral: Negative.        Objective:   Physical Exam  Constitutional: She is oriented to person, place, and time. She appears well-nourished. No distress.  Elderly appearing  HENT:  Head: Normocephalic and atraumatic.  Right Ear: External ear normal.  Left Ear: External ear normal.   Nose: Nose normal.  Mouth/Throat: Oropharynx is clear and moist.  Eyes: Conjunctivae and EOM are normal. Pupils are equal, round, and reactive to light. Right eye exhibits no discharge. Left eye exhibits no discharge. No scleral icterus.  eye exam as planned in January  Neck: Normal range of motion. Neck supple. No thyromegaly present.  Cardiovascular: Normal rate, regular rhythm, normal heart sounds and intact distal pulses.  Exam reveals no gallop and no friction rub.   No murmur heard. Pulmonary/Chest: Effort normal and breath sounds normal. No respiratory distress. She has no wheezes. She has no rales. She exhibits no tenderness.  Dry cough  Abdominal: Soft. Bowel sounds are normal. She exhibits no mass. There is no tenderness. There is no rebound and no guarding.  No abdominal tenderness masses or inguinal adenopathy  Musculoskeletal: Normal range of motion. She exhibits no edema.  Lymphadenopathy:    She has no cervical adenopathy.  Neurological: She is alert and oriented to person, place, and time. She has normal reflexes. No cranial nerve deficit.  Skin: Skin is warm and dry. No rash noted.  Dry skin  Psychiatric: She has a normal mood and affect. Her behavior is normal. Judgment and thought content normal.  Nursing note and vitals reviewed.   BP 126/60 mmHg  Pulse 80  Temp(Src) 97.3 F (36.3 C) (Oral)  Ht _0  (1.676 m)  Wt 155 lb (70.308 kg)  BMI 25.03 kg/m2       Assessment & Plan:  1. Essential hypertension -The blood pressure is good today the patient will continue with current treatment - BMP8+EGFR - CBC with Differential/Platelet - Hepatic function panel  2. Hyperlipidemia -Continue with current treatment pending results of lab work - CBC with Differential/Platelet - Lipid panel  3. Hypothyroidism, unspecified hypothyroidism type -Continue with current treatment pending results of lab work - CBC with Differential/Platelet - Thyroid Panel With TSH  4.  Vitamin D deficiency -Continue with current treatment pending results of lab work - CBC with Differential/Platelet - VITAMIN D 25 Hydroxy (Vit-D Deficiency, Fractures)  5. COPD (chronic obstructive pulmonary disease) with acute bronchitis (H[facility]) -Try to stop smoking and use inhalers as needed. Patient says she is not going to stop smoking. - CBC with Differential/Platelet  6. Gastroesophageal reflux disease, esophagitis presence not specified -No reflux symptoms and the patient should continue with watching foods that are not fried or greasy. - CBC with Differential/Platelet  7. Vitamin B 12 deficiency -Continue regular diet habits and B12 injections as doing - CBC with Differential/Platelet  8. Hypothyroidism due to acquired atrophy of thyroid -Continue with current thyroid replacement pending results of lab work  9. Hyperlipidemia -Continue with aggressive therapeutic lifestyle changes and current cholesterol medicine  10. Peripheral vascular disease, unspecified ([place]) -Walk and exercise regularly and discontinue smoking  11. Malignant neoplasm of anal canal ([place]) -Follow up with surgeon as needed and planned  12. Idiopathic cardiomyopathy ([place]) -Follow-up with cardiology  13. Heart failure, unspecified ([place]) -Follow-up with cardiology  14. Obstructive chronic bronchitis without exacerbation ([place]) -Stop smoking  Meds ordered this encounter  Medications  . albuterol (PROVENTIL) (2.5 MG/3ML) 0.083% nebulizer solution    Sig: USE ONE VIAL IN  NEBULIZER EVERY 6 HOURS AS NEEDED FOR WHEEZING    Dispense:  225 mL    Refill:  11  . hydrochlorothiazide (HYDRODIURIL) 25 MG tablet    Sig: TAKE ONE TABLET BY MOUTH ONCE DAILY    Dispense:  90 tablet    Refill:  3   Patient Instructions                       Medicare Annual Wellness Visit  [facility] and the medical providers at [facility] strive to bring you the best medical care.  In doing so we not  only want to address your current medical conditions and concerns but also to detect new conditions early and prevent illness, disease and health-related problems.    Medicare offers a yearly Wellness Visit which allows our clinical staff to assess your need for preventative services including immunizations, lifestyle education, counseling to decrease risk of preventable diseases and screening for fall risk and other medical concerns.    This visit is provided free of charge (no copay) for all Medicare recipients. The clinical pharmacists at [facility] have begun to conduct these Wellness Visits which will also include a thorough review of all your medications.    As you primary medical provider recommend that you make an appointment for your Annual Wellness Visit if you have not done so already this year.  You may set up this appointment before you leave today or you may call back ([phone]) and schedule an appointment.  Please make sure when you call that you mention that you are scheduling your Annual Wellness Visit with the clinical pharmacist so that the appointment may be made for the proper length of time.     Continue current medications. Continue good therapeutic lifestyle changes which include good diet and exercise. Fall precautions discussed with patient. If an FOBT was given today- please return it to our front desk. If you are over 66 years old - you may need Prevnar 84 or the adult Pneumonia vaccine.  **Flu shots are available--- please call and schedule a FLU-CLINIC appointment**  After your visit with [place] today you will receive a survey in the mail or online from [employer] regarding your care with [place]. Please take a moment to fill this out. Your feedback is very important to [place] as you can help [place] better understand your patient needs as well as improve your experience and satisfaction. WE CARE ABOUT YOU!!!   The patient should make every effort to start  walking more to improve the circulation to her lower extremities The patient should stay well hydrated this winter and drink plenty of fluids and keep the house as cool as possible She should use nasal saline regularly and nasal saline gel at bedtime    Arrie Senate MD

## 2015-02-26 LAB — LIPID PANEL
CHOL/HDL RATIO: 4.3 ratio (ref 0.0–4.4)
Cholesterol, Total: 177 mg/dL (ref 100–199)
HDL: 41 mg/dL (ref >39)
LDL Calculated: 107 mg/dL — ABNORMAL HIGH (ref 0–99)
Triglycerides: 147 mg/dL (ref 0–149)
VLDL CHOLESTEROL CAL: 29 mg/dL (ref 5–40)

## 2015-02-26 LAB — CBC WITH DIFFERENTIAL/PLATELET
BASOS: 1 %
Basophils Absolute: 0 10*3/uL (ref 0.0–0.2)
EOS (ABSOLUTE): 0.3 10*3/uL (ref 0.0–0.4)
Eos: 5 %
HEMATOCRIT: 44 % (ref 34.0–46.6)
HEMOGLOBIN: 14.9 g/dL (ref 11.1–15.9)
IMMATURE GRANULOCYTES: 0 %
Immature Grans (Abs): 0 10*3/uL (ref 0.0–0.1)
LYMPHS ABS: 2.2 10*3/uL (ref 0.7–3.1)
Lymphs: 33 %
MCH: 28.2 pg (ref 26.6–33.0)
MCHC: 33.9 g/dL (ref 31.5–35.7)
MCV: 83 fL (ref 79–97)
MONOCYTES: 5 %
MONOS ABS: 0.3 10*3/uL (ref 0.1–0.9)
Neutrophils Absolute: 3.7 10*3/uL (ref 1.4–7.0)
Neutrophils: 56 %
Platelets: 192 10*3/uL (ref 150–379)
RBC: 5.28 x10E6/uL (ref 3.77–5.28)
RDW: 14.7 % (ref 12.3–15.4)
WBC: 6.5 10*3/uL (ref 3.4–10.8)

## 2015-02-26 LAB — BMP8+EGFR
BUN / CREAT RATIO: 22 (ref 11–26)
BUN: 16 mg/dL (ref 8–27)
CO2: 30 mmol/L — ABNORMAL HIGH (ref 18–29)
CREATININE: 0.72 mg/dL (ref 0.57–1.00)
Calcium: 9.9 mg/dL (ref 8.7–10.3)
Chloride: 97 mmol/L (ref 97–106)
GFR calc Af Amer: 91 mL/min/{1.73_m2} (ref >59)
GFR, EST NON AFRICAN AMERICAN: 79 mL/min/{1.73_m2} (ref >59)
Glucose: 137 mg/dL — ABNORMAL HIGH (ref 65–99)
Potassium: 3.7 mmol/L (ref 3.5–5.2)
SODIUM: 141 mmol/L (ref 136–144)

## 2015-02-26 LAB — VITAMIN D 25 HYDROXY (VIT D DEFICIENCY, FRACTURES): Vit D, 25-Hydroxy: 55.5 ng/mL (ref 30.0–100.0)

## 2015-02-26 LAB — HEPATIC FUNCTION PANEL
ALK PHOS: 53 IU/L (ref 39–117)
ALT: 12 IU/L (ref 0–32)
AST: 19 IU/L (ref 0–40)
Albumin: 4 g/dL (ref 3.5–4.7)
BILIRUBIN TOTAL: 0.3 mg/dL (ref 0.0–1.2)
Bilirubin, Direct: 0.1 mg/dL (ref 0.00–0.40)
Total Protein: 6.4 g/dL (ref 6.0–8.5)

## 2015-02-26 LAB — THYROID PANEL WITH TSH
Free Thyroxine Index: 3 (ref 1.2–4.9)
T3 Uptake Ratio: 25 % (ref 24–39)
T4 TOTAL: 12 ug/dL (ref 4.5–12.0)
TSH: 0.445 u[IU]/mL — AB (ref 0.450–4.500)

## 2015-03-03 LAB — SPECIMEN STATUS REPORT

## 2015-03-03 LAB — HGB A1C W/O EAG: Hgb A1c MFr Bld: 7.4 % — ABNORMAL HIGH (ref 4.8–5.6)

## 2015-03-11 ENCOUNTER — Ambulatory Visit (INDEPENDENT_AMBULATORY_CARE_PROVIDER_SITE_OTHER): Payer: Medicare Other | Admitting: Pharmacist

## 2015-03-11 ENCOUNTER — Ambulatory Visit (INDEPENDENT_AMBULATORY_CARE_PROVIDER_SITE_OTHER): Payer: Medicare Other | Admitting: *Deleted

## 2015-03-11 ENCOUNTER — Encounter: Payer: Self-pay | Admitting: Pharmacist

## 2015-03-11 VITALS — BP 133/72 | HR 75 | Ht 66.0 in | Wt 156.0 lb

## 2015-03-11 DIAGNOSIS — E538 Deficiency of other specified B group vitamins: Secondary | ICD-10-CM | POA: Diagnosis not present

## 2015-03-11 DIAGNOSIS — E119 Type 2 diabetes mellitus without complications: Secondary | ICD-10-CM | POA: Insufficient documentation

## 2015-03-11 DIAGNOSIS — D509 Iron deficiency anemia, unspecified: Secondary | ICD-10-CM

## 2015-03-11 MED ORDER — ONETOUCH DELICA LANCETS FINE MISC
Status: AC
Start: 2015-03-11 — End: ?

## 2015-03-11 MED ORDER — GLUCOSE BLOOD VI STRP: ORAL_STRIP | Status: DC

## 2015-03-11 NOTE — Progress Notes (Signed)
I Subjective:    Katie Wilkins is a 81 y.o. female who presents for an initial evaluation of Type 2 diabetes mellitus.  Patient was noted to have a BG of 137 and her regular check up with PCP on 02/25/2015.  A1c was checked and found to be 7.4%.  Current symptoms/problems include none   Known diabetic complications: none Cardiovascular risk factors: advanced age (older than 36 for men, 32 for women), diabetes mellitus and smoking/ tobacco exposure Current diabetic medications include none.   Eye exam current (within one year): no Weight trend: stable Prior visit with dietician: no Current diet: in general, an "unhealthy" diet - eating lots of candy - chocolate covered peanuts and peppermint chews.  Also eats lots of icecream, and cookies.  Current exercise: none  Current monitoring regimen: none  Is She on ACE inhibitor or angiotensin II receptor blocker?  No    The following portions of the patient's history were reviewed and updated as appropriate: allergies, current medications, past family history, past medical history, past social history, past surgical history and problem list.     Objective:    BP 133/72 mmHg  Pulse 75  Ht '5\' 6"'$  (1.676 m)  Wt 156 lb (70.761 kg)  BMI 25.19 kg/m2  Lab Review GLUCOSE (mg/dL)  Date Value  02/25/2015 137*  10/01/2014 109*  05/24/2014 86   GLUCOSE, BLD (mg/dL)  Date Value  10/09/2012 119*  06/10/2012 91   CO2 (mmol/L)  Date Value  02/25/2015 30*  10/01/2014 27  05/24/2014 26   BUN (mg/dL)  Date Value  02/25/2015 16  10/01/2014 21  05/24/2014 19  10/09/2012 16  06/10/2012 16   CREATININE (mg/dL)  Date Value  04/01/2012 0.7   CREAT (mg/dL)  Date Value  10/09/2012 0.62  06/10/2012 0.72   CREATININE, SER (mg/dL)  Date Value  02/25/2015 0.72  10/01/2014 0.79  05/24/2014 0.65    Assessment:    Diabetes Mellitus type II, newly diagnosed under inadequate control.    Plan:    1.  Rx changes: none 2.   Education: Reviewed 'ABCs' of diabetes management (respective goals in parentheses):  A1C (<7), blood pressure (<130/80), and cholesterol (LDL <100). 3.  Patient given one touch verio glucometer and taught to use.  She checked own BG in office today and it was 104.  Rx given for testing supplies and she was instructed to check BG every other day and record.  4.  Discussed CHO content of various foods and serving size recommendations. Recommended 45 to 50 grams of CHO per meal and 15 to 20 grams of CHO per snack.  Patient agrees there is lots of room to improve diet choices and she is motivated to do this.  5.  Recommended increase physical activity - gave her handout with chair exercise and she is to try to walk either in [place] or [employer] or her home.  6.  RTC in 1 month to check BG and AWV.    Cherre Robins, PharmD, CPP, CDE

## 2015-03-11 NOTE — Patient Instructions (Signed)
Diabetes and Standards of Medical Care   Diabetes is complicated. You may find that your diabetes team includes a dietitian, nurse, diabetes educator, eye doctor, and more. To help everyone know what is going on and to help you get the care you deserve, the following schedule of care was developed to help keep you on track. Below are the tests, exams, vaccines, medicines, education, and plans you will need.  Blood Glucose Goals Prior to meals = 80 - 130 Within 2 hours of the start of a meal = less than 180  HbA1c test (goal is less than 7.0% - your last value was 7.4%) This test shows how well you have controlled your glucose over the past 2 to 3 months. It is used to see if your diabetes management plan needs to be adjusted.   It is performed at least 2 times a year if you are meeting treatment goals.  It is performed 4 times a year if therapy has changed or if you are not meeting treatment goals.  Blood pressure test  This test is performed at every routine medical visit. The goal is less than 140/90 mmHg for most people, but 130/80 mmHg in some cases. Ask your health care provider about your goal.  Dental exam  Follow up with the dentist regularly.  Eye exam  If you are diagnosed with type 1 diabetes as a child, get an exam upon reaching the age of 10 years or older and have had diabetes for 3 to 5 years. Yearly eye exams are recommended after that initial eye exam.  If you are diagnosed with type 1 diabetes as an adult, get an exam within 5 years of diagnosis and then yearly.  If you are diagnosed with type 2 diabetes, get an exam as soon as possible after the diagnosis and then yearly.  Foot care exam  Visual foot exams are performed at every routine medical visit. The exams check for cuts, injuries, or other problems with the feet.  A comprehensive foot exam should be done yearly. This includes visual inspection as well as assessing foot pulses and testing for loss of  sensation.  Check your feet nightly for cuts, injuries, or other problems with your feet. Tell your health care provider if anything is not healing.  Kidney function test (urine microalbumin)  This test is performed once a year.  Type 1 diabetes: The first test is performed 5 years after diagnosis.  Type 2 diabetes: The first test is performed at the time of diagnosis.  A serum creatinine and estimated glomerular filtration rate (eGFR) test is done once a year to assess the level of chronic kidney disease (CKD), if present.  Lipid profile (cholesterol, HDL, LDL, triglycerides)  Performed every 5 years for most people.  The goal for LDL is less than 100 mg/dL. If you are at high risk, the goal is less than 70 mg/dL.  The goal for HDL is 40 mg/dL to 50 mg/dL for men and 50 mg/dL to 60 mg/dL for women. An HDL cholesterol of 60 mg/dL or higher gives some protection against heart disease.  The goal for triglycerides is less than 150 mg/dL.  Influenza vaccine, pneumococcal vaccine, and hepatitis B vaccine  The influenza vaccine is recommended yearly.  The pneumococcal vaccine is generally given once in a lifetime. However, there are some instances when another vaccination is recommended. Check with your health care provider.  The hepatitis B vaccine is also recommended for adults with diabetes.    Diabetes self-management education  Education is recommended at diagnosis and ongoing as needed.  Treatment plan  Your treatment plan is reviewed at every medical visit.  Document Released: 12/31/2008 Document Revised: 11/05/2012 Document Reviewed: 08/05/2012 Exit[employer] Patient Information 2014 [employer].   

## 2015-03-11 NOTE — Progress Notes (Signed)
Pt given B12 injection IM left deltoid and tolerated well. °

## 2015-04-12 ENCOUNTER — Ambulatory Visit (INDEPENDENT_AMBULATORY_CARE_PROVIDER_SITE_OTHER): Payer: Medicare Other | Admitting: *Deleted

## 2015-04-12 DIAGNOSIS — E538 Deficiency of other specified B group vitamins: Secondary | ICD-10-CM

## 2015-04-12 DIAGNOSIS — D509 Iron deficiency anemia, unspecified: Secondary | ICD-10-CM | POA: Diagnosis not present

## 2015-04-12 NOTE — Patient Instructions (Signed)

## 2015-04-12 NOTE — Progress Notes (Signed)
Vitamin b12 injection given and patient tolerated well.  

## 2015-05-16 ENCOUNTER — Encounter: Payer: Self-pay | Admitting: Pharmacist

## 2015-05-16 ENCOUNTER — Other Ambulatory Visit: Payer: Self-pay | Admitting: *Deleted

## 2015-05-16 ENCOUNTER — Telehealth: Payer: Self-pay | Admitting: Family Medicine

## 2015-05-16 ENCOUNTER — Ambulatory Visit (INDEPENDENT_AMBULATORY_CARE_PROVIDER_SITE_OTHER): Payer: Medicare Other | Admitting: Pharmacist

## 2015-05-16 VITALS — BP 122/68 | HR 64 | Ht 66.0 in | Wt 146.5 lb

## 2015-05-16 DIAGNOSIS — J209 Acute bronchitis, unspecified: Secondary | ICD-10-CM

## 2015-05-16 DIAGNOSIS — J440 Chronic obstructive pulmonary disease with acute lower respiratory infection: Secondary | ICD-10-CM

## 2015-05-16 DIAGNOSIS — E119 Type 2 diabetes mellitus without complications: Secondary | ICD-10-CM | POA: Diagnosis not present

## 2015-05-16 DIAGNOSIS — M8580 Other specified disorders of bone density and structure, unspecified site: Secondary | ICD-10-CM

## 2015-05-16 DIAGNOSIS — Z0000 Encounter for general adult medical examination without abnormal findings: Secondary | ICD-10-CM

## 2015-05-16 DIAGNOSIS — E538 Deficiency of other specified B group vitamins: Secondary | ICD-10-CM | POA: Diagnosis not present

## 2015-05-16 MED ORDER — BUDESONIDE 0.5 MG/2ML IN SUSP: 0.5000 mg | Freq: Two times a day (BID) | RESPIRATORY_TRACT | Status: DC

## 2015-05-16 MED ORDER — CALCIUM-VITAMIN D-VITAMIN K 500-1000-40 MG-UNT-MCG PO CHEW: 1.0000 | CHEWABLE_TABLET | Freq: Two times a day (BID) | ORAL | Status: DC

## 2015-05-16 NOTE — Telephone Encounter (Signed)
rx resent to pharmacy

## 2015-05-16 NOTE — Patient Instructions (Addendum)
Katie Wilkins , Thank you for taking time to come for your Medicare Wellness Visit. I appreciate your ongoing commitment to your health goals. Please review the following plan we discussed and let me know if I can assist you in the future.   These are the goals we discussed: Continue with current dietary changes - your blood glucose / sugar looks great!  Increase non-starchy vegetables - carrots, green bean, squash, zucchini, tomatoes, onions, peppers, spinach and other green leafy vegetables, cabbage, lettuce, cucumbers, asparagus, okra (not fried), eggplant limit sugar and processed foods (cakes, cookies, ice cream, crackers and chips) Increase fresh fruit but limit serving sizes 1/2 cup or about the size of tennis or baseball limit red meat to no more than 1-2 times per week (serving size about the size of your palm) Choose whole grains / lean proteins - whole wheat bread, quinoa, whole grain rice (1/2 cup), fish, chicken, [place]  Try to start doing chair exercises from handout given at least once a day. Bring in copy of Living Will and [place]   This is a list of the screening recommended for you and due dates:  Health Maintenance  Topic Date Due  . Complete foot exam   Will do at appt with Dr Laurance Flatten  . Eye exam for diabetics  Requesting notes from Dr Gus Puma appointment a few weeks ago  . Urine Protein Check  05/15/2016  . DEXA scan (bone density measurement)  07/16/2015  . Hemoglobin A1C  08/26/2015  . Flu Shot  10/18/2015  . Tetanus Vaccine  12/19/2020  . Shingles Vaccine  Addressed  . Pneumonia vaccines  Completed    Health Maintenance, Female Adopting a healthy lifestyle and getting preventive care can go a long way to promote health and wellness. Talk with your health care provider about what schedule of regular examinations is right for you. This is a good chance for you to check in with your provider about disease prevention and staying healthy. In between  checkups, there are plenty of things you can do on your own. Experts have done a lot of research about which lifestyle changes and preventive measures are most likely to keep you healthy. Ask your health care provider for more information. WEIGHT AND DIET  Eat a healthy diet  Be sure to include plenty of vegetables, fruits, low-fat dairy products, and lean protein.  Do not eat a lot of foods high in solid fats, added sugars, or salt.  Get regular exercise. This is one of the most important things you can do for your health.  Most adults should exercise for at least 150 minutes each week. The exercise should increase your heart rate and make you sweat (moderate-intensity exercise).  Most adults should also do strengthening exercises at least twice a week. This is in addition to the moderate-intensity exercise.  Maintain a healthy weight  Body mass index (BMI) is a measurement that can be used to identify possible weight problems. It estimates body fat based on height and weight. Your health care provider can help determine your BMI and help you achieve or maintain a healthy weight.  For females 2 years of age and older:   A BMI below 18.5 is considered underweight.  A BMI of 18.5 to 24.9 is normal.  A BMI of 25 to 29.9 is considered overweight.  A BMI of 30 and above is considered obese.  Watch levels of cholesterol and blood lipids  You should start having your blood  tested for lipids and cholesterol at 80 years of age, then have this test every 5 years.  You may need to have your cholesterol levels checked more often if:  Your lipid or cholesterol levels are high.  You are older than 80 years of age.  You are at high risk for heart disease.  CANCER SCREENING   Lung Cancer  Lung cancer screening is recommended for adults 70-2 years old who are at high risk for lung cancer because of a history of smoking.  A yearly low-dose CT scan of the lungs is recommended for  people who:  Currently smoke.  Have quit within the past 15 years.  Have at least a 30-pack-year history of smoking. A pack year is smoking an average of one pack of cigarettes a day for 1 year.  Yearly screening should continue until it has been 15 years since you quit.  Yearly screening should stop if you develop a health problem that would prevent you from having lung cancer treatment.  Breast Cancer  Practice breast self-awareness. This means understanding how your breasts normally appear and feel.  It also means doing regular breast self-exams. Let your health care provider know about any changes, no matter how small.  If you are in your 20s or 30s, you should have a clinical breast exam (CBE) by a health care provider every 1-3 years as part of a regular health exam.  If you are 39 or older, have a CBE every year. Also consider having a breast X-ray (mammogram) every year.  If you have a family history of breast cancer, talk to your health care provider about genetic screening.  If you are at high risk for breast cancer, talk to your health care provider about having an MRI and a mammogram every year.  Breast cancer gene (BRCA) assessment is recommended for women who have family members with BRCA-related cancers. BRCA-related cancers include:  Breast.  Ovarian.  Tubal.  Peritoneal cancers.  Results of the assessment will determine the need for genetic counseling and BRCA1 and BRCA2 testing. Cervical Cancer Your health care provider may recommend that you be screened regularly for cancer of the pelvic organs (ovaries, uterus, and vagina). This screening involves a pelvic examination, including checking for microscopic changes to the surface of your cervix (Pap test). You may be encouraged to have this screening done every 3 years, beginning at age 37.  For women ages 49-65, health care providers may recommend pelvic exams and Pap testing every 3 years, or they may  recommend the Pap and pelvic exam, combined with testing for human papilloma virus (HPV), every 5 years. Some types of HPV increase your risk of cervical cancer. Testing for HPV may also be done on women of any age with unclear Pap test results.  Other health care providers may not recommend any screening for nonpregnant women who are considered low risk for pelvic cancer and who do not have symptoms. Ask your health care provider if a screening pelvic exam is right for you.  If you have had past treatment for cervical cancer or a condition that could lead to cancer, you need Pap tests and screening for cancer for at least 20 years after your treatment. If Pap tests have been discontinued, your risk factors (such as having a new sexual partner) need to be reassessed to determine if screening should resume. Some women have medical problems that increase the chance of getting cervical cancer. In these cases, your health care provider  may recommend more frequent screening and Pap tests. Colorectal Cancer  This type of cancer can be detected and often prevented.  Routine colorectal cancer screening usually begins at 80 years of age and continues through 80 years of age.  Your health care provider may recommend screening at an earlier age if you have risk factors for colon cancer.  Your health care provider may also recommend using home test kits to check for hidden blood in the stool.  A small camera at the end of a tube can be used to examine your colon directly (sigmoidoscopy or colonoscopy). This is done to check for the earliest forms of colorectal cancer.  Routine screening usually begins at age 21.  Direct examination of the colon should be repeated every 5-10 years through 80 years of age. However, you may need to be screened more often if early forms of precancerous polyps or small growths are found. Skin Cancer  Check your skin from head to toe regularly.  Tell your health care provider  about any new moles or changes in moles, especially if there is a change in a mole's shape or color.  Also tell your health care provider if you have a mole that is larger than the size of a pencil eraser.  Always use sunscreen. Apply sunscreen liberally and repeatedly throughout the day.  Protect yourself by wearing long sleeves, pants, a wide-brimmed hat, and sunglasses whenever you are outside. HEART DISEASE, DIABETES, AND HIGH BLOOD PRESSURE   High blood pressure causes heart disease and increases the risk of stroke. High blood pressure is more likely to develop in:  People who have blood pressure in the high end of the normal range (130-139/85-89 mm Hg).  People who are overweight or obese.  People who are African American.  If you are 47-9 years of age, have your blood pressure checked every 3-5 years. If you are 57 years of age or older, have your blood pressure checked every year. You should have your blood pressure measured twice--once when you are at a hospital or clinic, and once when you are not at a hospital or clinic. Record the average of the two measurements. To check your blood pressure when you are not at a hospital or clinic, you can use:  An automated blood pressure machine at a pharmacy.  A home blood pressure monitor.  If you are between 56 years and 66 years old, ask your health care provider if you should take aspirin to prevent strokes.  Have regular diabetes screenings. This involves taking a blood sample to check your fasting blood sugar level.  If you are at a normal weight and have a low risk for diabetes, have this test once every three years after 80 years of age.  If you are overweight and have a high risk for diabetes, consider being tested at a younger age or more often. PREVENTING INFECTION  Hepatitis B  If you have a higher risk for hepatitis B, you should be screened for this virus. You are considered at high risk for hepatitis B if:  You were  born in a country where hepatitis B is common. Ask your health care provider which countries are considered high risk.  Your parents were born in a high-risk country, and you have not been immunized against hepatitis B (hepatitis B vaccine).  You have HIV or AIDS.  You use needles to inject street drugs.  You live with someone who has hepatitis B.  You have had  sex with someone who has hepatitis B.  You get hemodialysis treatment.  You take certain medicines for conditions, including cancer, organ transplantation, and autoimmune conditions. Hepatitis C  Blood testing is recommended for:  Everyone born from [birth year] through 1963.  Anyone with known risk factors for hepatitis C. Sexually transmitted infections (STIs)  You should be screened for sexually transmitted infections (STIs) including gonorrhea and chlamydia if:  You are sexually active and are younger than 80 years of age.  You are older than 80 years of age and your health care provider tells you that you are at risk for this type of infection.  Your sexual activity has changed since you were last screened and you are at an increased risk for chlamydia or gonorrhea. Ask your health care provider if you are at risk.  If you do not have HIV, but are at risk, it may be recommended that you take a prescription medicine daily to prevent HIV infection. This is called pre-exposure prophylaxis (PrEP). You are considered at risk if:  You are sexually active and do not regularly use condoms or know the HIV status of your partner(s).  You take drugs by injection.  You are sexually active with a partner who has HIV. Talk with your health care provider about whether you are at high risk of being infected with HIV. If you choose to begin PrEP, you should first be tested for HIV. You should then be tested every 3 months for as long as you are taking PrEP.  PREGNANCY   If you are premenopausal and you may become pregnant, ask your  health care provider about preconception counseling.  If you may become pregnant, take 400 to 800 micrograms (mcg) of folic acid every day.  If you want to prevent pregnancy, talk to your health care provider about birth control (contraception). OSTEOPOROSIS AND MENOPAUSE   Osteoporosis is a disease in which the bones lose minerals and strength with aging. This can result in serious bone fractures. Your risk for osteoporosis can be identified using a bone density scan.  If you are 46 years of age or older, or if you are at risk for osteoporosis and fractures, ask your health care provider if you should be screened.  Ask your health care provider whether you should take a calcium or vitamin D supplement to lower your risk for osteoporosis.  Menopause may have certain physical symptoms and risks.  Hormone replacement therapy may reduce some of these symptoms and risks. Talk to your health care provider about whether hormone replacement therapy is right for you.  HOME CARE INSTRUCTIONS   Schedule regular health, dental, and eye exams.  Stay current with your immunizations.   Do not use any tobacco products including cigarettes, chewing tobacco, or electronic cigarettes.  If you are pregnant, do not drink alcohol.  If you are breastfeeding, limit how much and how often you drink alcohol.  Limit alcohol intake to no more than 1 drink per day for nonpregnant women. One drink equals 12 ounces of beer, 5 ounces of wine, or 1 ounces of hard liquor.  Do not use street drugs.  Do not share needles.  Ask your health care provider for help if you need support or information about quitting drugs.  Tell your health care provider if you often feel depressed.  Tell your health care provider if you have ever been abused or do not feel safe at home.   This information is not intended to replace advice  given to you by your health care provider. Make sure you discuss any questions you have with  your health care provider.   Document Released: 09/18/2010 Document Revised: 03/26/2014 Document Reviewed: 02/04/2013 Elsevier Interactive Patient Education [employer].

## 2015-05-16 NOTE — Progress Notes (Signed)
[name], female   DOB: 18-Jan-1932, 82 y.o.   MRN: [id number]   Subjective:   Katie Wilkins is a 82 y.o. female who presents for an Initial Medicare Annual Wellness Visit and to recheck T2DM.  She is widowed since June 2015.  She lives alone but has good family support.  Daughter and her husband visit at least 3 times per week.  She also reports that at least once a week she goes out with friends from church. She does not drive long distances but still drives to church, store and Emergency planning/management officer.  I saw patient about 1 month ago and we discussed diet and diabetes.  She has been checking BG every da to every other day.  She bring in record of readings.  HBG ranges from 89 to 115.   Review of Systems  Review of Systems  Constitutional: Negative.   HENT: Negative.   Eyes: Negative.   Respiratory: Negative.   Cardiovascular: Negative.   Gastrointestinal: Negative.   Genitourinary: Negative.   Musculoskeletal: Negative.   Skin: Negative.   Neurological: Negative.   Endo/Heme/Allergies: Negative.   Psychiatric/Behavioral: Negative.      Current Medications (verified) Outpatient Encounter Prescriptions as of 05/16/2015  Medication Sig  . acetaminophen (TYLENOL) 325 MG tablet Take 650 mg by mouth at bedtime.  Marland Kitchen albuterol (PROVENTIL HFA;VENTOLIN HFA) 108 (90 BASE) MCG/ACT inhaler Inhale 1 puff into the lungs as needed.  Marland Kitchen albuterol (PROVENTIL) (2.5 MG/3ML) 0.083% nebulizer solution USE ONE VIAL IN NEBULIZER EVERY 6 HOURS AS NEEDED FOR WHEEZING  . cyanocobalamin (,VITAMIN B-12,) 1000 MCG/ML injection Inject 1,000 mcg into the muscle every 30 (thirty) days.   . ferrous sulfate 325 (65 FE) MG tablet Take 325 mg by mouth daily with breakfast.  . glucose blood (ONETOUCH VERIO) test strip Use to check blood glucose once daily.  DX:  E.11.9 - type 2 diabetes  . hydrochlorothiazide (HYDRODIURIL) 25 MG tablet TAKE ONE TABLET BY MOUTH ONCE DAILY  . levothyroxine (SYNTHROID,  LEVOTHROID) 112 MCG tablet Take 112 mcg by mouth daily.  . meclizine (ANTIVERT) 32 MG tablet Take 1 tablet (32 mg total) by mouth 3 (three) times daily as needed.  Marland Kitchen omega-3 acid ethyl esters (LOVAZA) 1 G capsule Take 1 g by mouth daily.   Glory Rosebush DELICA LANCETS FINE MISC Use  To check blood glucose once a day.  DX:  Type 2 DM E11.9  . Pitavastatin Calcium (LIVALO) 4 MG TABS Take 1 tablet by mouth daily.   . Calcium-Vitamin D-Vitamin K (CALCIUM + D) [phone] MG-UNT-MCG CHEW Chew 1 tablet by mouth 2 (two) times daily.  . Cholecalciferol (VITAMIN D3) 1000 UNITS tablet Take 2,000 Units by mouth daily.   . [DISCONTINUED] budesonide (PULMICORT) 0.5 MG/2ML nebulizer solution Take 2 mLs (0.5 mg total) by nebulization 2 (two) times daily. (Patient not taking: Reported on 05/16/2015)  . [DISCONTINUED] budesonide (PULMICORT) 0.5 MG/2ML nebulizer solution Take 2 mLs (0.5 mg total) by nebulization 2 (two) times daily. Dx: COPD  . [DISCONTINUED] predniSONE (DELTASONE) 20 MG tablet Take 2 tablets (40 mg total) by mouth daily with breakfast. (Patient not taking: Reported on 05/16/2015)   Facility-Administered Encounter Medications as of 05/16/2015  Medication  . cyanocobalamin ((VITAMIN B-12)) injection 1,000 mcg    Allergies (verified) Ace inhibitors and Codeine   History: Past Medical History  Diagnosis Date  . Abdominal bloating   . Diarrhea   . Hemorrhoids   . Rectal bleeding   . Heme +  stool   . Bowen's disease   . Arthritis   . Diverticulosis   . Renal cyst   . Hiatal hernia   . Hyperlipemia   . anemia iron deficiency   . Neoplasm     Malignant, anal canal, squamous cell  . CHF (congestive heart failure) ([place])   . Cardiomyopathy   . COPD (chronic obstructive pulmonary disease) ([place])   . Vitamin D deficiency   . Rectocele   . Mitral valve regurgitation   . Nodular goiter   . Diabetes mellitus without complication ([place])   . Cataract   . Osteoporosis    Past Surgical History    Procedure Laterality Date  . Squamous cell carcinoma excision  1985    anus resected  . Hemorrhoid surgery    . Colon surgery      excision of cancer  . Nasal septum surgery    . Abdominal hysterectomy      vaginal secondary to fibroids  . Eye surgery     Family History  Problem Relation Age of Onset  . Colon cancer Neg Hx   . Diabetes Mother   . Alzheimer's disease Mother   . Diabetes Brother   . Alzheimer's disease Brother    Social History   Occupational History  . Retired    Social History Main Topics  . Smoking status: Current Every Day Smoker -- 0.50 packs/day for 57 years    Types: Cigarettes    Start date: 03/19/1953  . Smokeless tobacco: Never Used  . Alcohol Use: No  . Drug Use: No  . Sexual Activity: No    Do you feel safe at home?  Yes  Dietary issues and exercise activities: Current Exercise Habits:: Home exercise routine, Type of exercise: walking, Time (Minutes): 10, Frequency (Times/Week): 3, Weekly Exercise (Minutes/Week): 30, Intensity: Mild  Current Dietary habits:  Over the last month patient has made significant diet changes to decrease both her weight and BG.  She has limited sugar containing foods and beverages and has increased non starchy vegetables in her diet.    Objective:    Today's Vitals   05/16/15 1029  BP: 122/68  Pulse: 64  Height: '5\' 6"'$  (1.676 m)  Weight: 146 lb 8 oz (66.452 kg)  PainSc: 0-No pain   Body mass index is 23.66 kg/(m^2).  Activities of Daily Living In your present state of health, do you have any difficulty performing the following activities: 05/16/2015  Hearing? N  Vision? N  Difficulty concentrating or making decisions? N  Walking or climbing stairs? Y  Dressing or bathing? N  Doing errands, shopping? N  Preparing Food and eating ? N  Using the Toilet? N  In the past six months, have you accidently leaked urine? N  Do you have problems with loss of bowel control? N  Managing your Medications? N   Managing your Finances? N  Housekeeping or managing your Housekeeping? N    Are there smokers in your home (other than you)? No   Cardiac Risk Factors include: advanced age (>58mn, >>39women);diabetes mellitus;dyslipidemia;smoking/ tobacco exposure  Depression Screen PHQ 2/9 Scores 05/16/2015 02/25/2015 10/01/2014 05/24/2014  PHQ - 2 Score 1 0 0 0  PHQ- 9 Score - - - -    Fall Risk Fall Risk  05/16/2015 02/25/2015 10/01/2014 05/24/2014 09/21/2013  Falls in the past year? No No No No No    Cognitive Function: MMSE - Mini Mental State Exam 05/16/2015  Orientation to time 5  Orientation  to Place 5  Registration 3  Attention/ Calculation 5  Recall 2  Language- name 2 objects 2  Language- repeat 1  Language- follow 3 step command 3  Language- read & follow direction 1  Write a sentence 1  Copy design 0  Total score 28    Immunizations and Health Maintenance Immunization History  Administered Date(s) Administered  . Influenza Split 12/20/2012  . Influenza Whole 12/22/2011  . Influenza,inj,Quad PF,36+ Mos 01/07/2014, 01/04/2015  . Pneumococcal Conjugate-13 09/21/2013  . Pneumococcal Polysaccharide-23 07/18/1998, 06/10/2012  . Tdap 12/20/2010   Health Maintenance Due  Topic Date Due  . FOOT EXAM  08/16/1943  . OPHTHALMOLOGY EXAM  08/16/1943  . URINE MICROALBUMIN  08/16/1943    Patient Care Team: Chipper Herb, MD as PCP - General (Family Medicine) Sable Feil, MD (Gastroenterology)  Indicate any recent Medical Services you may have received from other than [facility] providers in the past year (date may be approximate).    Assessment:    Annual Wellness Visit  T2DM - improved per HBG readings Overweight - weight has decreased by 9# Medication Management - patient is not using pulmicort as it is too expensive and health dept's patient assistance cannot get for her any longer.  Tobacco Abuse Osteoporosis - DEXA due in next 2 -3 months    Screening Tests Health  Maintenance  Topic Date Due  . FOOT EXAM  08/16/1943  . OPHTHALMOLOGY EXAM  08/16/1943  . URINE MICROALBUMIN  08/16/1943  . DEXA SCAN  07/16/2015  . HEMOGLOBIN A1C  08/26/2015  . INFLUENZA VACCINE  10/18/2015  . TETANUS/TDAP  12/19/2020  . ZOSTAVAX  Addressed  . PNA vac Low Risk Adult  Completed       Plan:   During the course of the visit Katie Wilkins was educated and counseled about the following appropriate screening and preventive services:   Vaccines to include Pneumoccal, Influenza, Td, Zostavax - all required vaccines are UTD  Colorectal cancer screening - FOBT is UTD  Cardiovascular disease screening - no EKG, PCP to consider at next appt  Diabetes - HBG reading much improved and patient's diet is great.  Recommend continue with current changes and will recheck A1c in April 2017.  Bone Denisty / Osteoporosis Screening - Due in April - referral sent  Mammogram - UTD  PAP - no longer required  Glaucoma screening / Diabetic Eye Exam - UTD had diabetic eye exam with Dr Marin Comment 04/2015.  Requesting records today and will update in HM when received.   Nutrition counseling - patient is doing great with current changes in diet.  Continue to limit high sugar foods and increased fruits and vegetables, wholes grains and lean proteins.  Smoking cessation counseling - discussed benefits of D/C smoking patient has declined further counseling or intervention  Advanced Directives - requested copy  Physical Activity - handout for Chair exercises given and explained.  Patient to try to do daily.  Rx for Pulmicort ampules sent to [facility] - depending on her Medicare plan these might be covered by her Medicare Part B.    Orders Placed This Encounter  Procedures  . DG Bone Density    Scheduling Instructions:     Please scheduled DEXA for 07/16/2015 or after (last DEXA was 07/15/2013)    Order Specific Question:  Reason for Exam (SYMPTOM  OR DIAGNOSIS REQUIRED)    Answer:  osteopenia     Order Specific Question:  Preferred imaging location?    Answer:  Internal  .  Microalbumin / creatinine urine ratio      Patient Instructions (the written plan) were given to the patient.   Cherre Robins, [facility]   05/17/2015

## 2015-05-17 ENCOUNTER — Other Ambulatory Visit: Payer: Self-pay

## 2015-05-17 ENCOUNTER — Other Ambulatory Visit: Payer: Medicare Other

## 2015-05-17 DIAGNOSIS — J440 Chronic obstructive pulmonary disease with acute lower respiratory infection: Secondary | ICD-10-CM

## 2015-05-17 DIAGNOSIS — J209 Acute bronchitis, unspecified: Secondary | ICD-10-CM

## 2015-05-17 LAB — MICROALBUMIN / CREATININE URINE RATIO
CREATININE, UR: 74.2 mg/dL
MICROALB/CREAT RATIO: 15.4 mg/g creat (ref 0.0–30.0)
Microalbumin, Urine: 11.4 ug/mL

## 2015-05-17 MED ORDER — BUDESONIDE 0.5 MG/2ML IN SUSP: 0.5000 mg | Freq: Two times a day (BID) | RESPIRATORY_TRACT | Status: DC

## 2015-06-14 ENCOUNTER — Other Ambulatory Visit: Payer: Self-pay

## 2015-06-14 ENCOUNTER — Ambulatory Visit (INDEPENDENT_AMBULATORY_CARE_PROVIDER_SITE_OTHER): Payer: Medicare Other | Admitting: *Deleted

## 2015-06-14 DIAGNOSIS — E538 Deficiency of other specified B group vitamins: Secondary | ICD-10-CM

## 2015-06-14 DIAGNOSIS — D509 Iron deficiency anemia, unspecified: Secondary | ICD-10-CM

## 2015-06-14 NOTE — Progress Notes (Signed)
Vitamin b12 injection given and patient tolerated well.  

## 2015-06-14 NOTE — Patient Instructions (Signed)

## 2015-06-16 ENCOUNTER — Telehealth: Payer: Self-pay | Admitting: *Deleted

## 2015-06-16 NOTE — Telephone Encounter (Signed)
Received Dexilant cap '60mg'$  #90 [facility] that was supposed to be sent to [name] at the [facility]. Ms. Katie Wilkins was notified that medication was sent to our office. Patient's daughter [name] notified that medication was at our office to be picked up.

## 2015-06-25 ENCOUNTER — Ambulatory Visit (INDEPENDENT_AMBULATORY_CARE_PROVIDER_SITE_OTHER): Payer: Medicare Other | Admitting: Family Medicine

## 2015-06-25 VITALS — BP 128/76 | Temp 96.9°F | Ht 66.0 in | Wt 141.0 lb

## 2015-06-25 DIAGNOSIS — T25022A Burn of unspecified degree of left foot, initial encounter: Secondary | ICD-10-CM | POA: Diagnosis not present

## 2015-06-25 MED ORDER — DOXYCYCLINE HYCLATE 100 MG PO TABS: 100.0000 mg | ORAL_TABLET | Freq: Two times a day (BID) | ORAL | Status: DC

## 2015-06-25 MED ORDER — SILVER SULFADIAZINE 1 % EX CREA
1.0000 | TOPICAL_CREAM | Freq: Every day | CUTANEOUS | Status: DC
Start: 2015-06-25 — End: 2015-06-29

## 2015-06-25 NOTE — Progress Notes (Signed)
   HPI  Patient presents today here with a foot burn.  Explains that 4 days ago she had a heating pad on her foot causing a burn. Previous to that heating pad application she had placed Aspercreme on the foot. She was trying to treat her arthritic pain.  Since that time she has had an erosion of one of the blisters on her fourth toe. She has blisters on her fifth toe and lateral distal foot. She has pain with wearing shoes and walking. She has developed new redness over the lateral dorsal foot in the last 24-36 hours.  PMH: Smoking status noted ROS: Per HPI  Objective: BP 128/76 mmHg  Temp(Src) 96.9 F (36.1 C) (Oral)  Ht '5\' 6"'$  (1.676 m)  Wt 141 lb (63.957 kg)  BMI 22.77 kg/m2 Gen: NAD, alert, cooperative with exam HEENT: NCAT CV: RRR, good S1/S2, no murmur Resp: CTABL, no wheezes, non-labored Ext: No edema, warm Neuro: Alert and oriented, No gross deficits  Skin: Erosion on the fourth left toe 15 mm x 12 mm Blisters measuring 14 x 16 mm and 32 mm x 22 mm on the fifth digit and left distal lateral foot. No obvious openings on the blisters. Approximately 4 cm x 3 cm area of erythema on the left lateral foot extending from the blister on the lateral foot   Assessment and plan:  # Foot burn From heating pad Discussed usual healing Given the area of erythema has developed over the last day or so I've gone ahead and given her a seven-day course of doxycycline Cover burns in Silvadene. Discussed usual wound care. Return to clinic with any concerns or worsening symptoms,, or failure to improves expected    Meds ordered this encounter  Medications  . doxycycline (VIBRA-TABS) 100 MG tablet    Sig: Take 1 tablet (100 mg total) by mouth 2 (two) times daily. 1 po bid    Dispense:  14 tablet    Refill:  0  . silver sulfADIAZINE (SILVADENE) 1 % cream    Sig: Apply 1 application topically daily.    Dispense:  50 g    Refill:  [place], MD [facility] 06/25/2015, 9:26 AM

## 2015-06-25 NOTE — Patient Instructions (Signed)
Great to see you!  Take all antibiotics  Use silvadene cream 1 to two times daily until it heals.   Call or come back if you have any problems.

## 2015-06-29 ENCOUNTER — Ambulatory Visit (INDEPENDENT_AMBULATORY_CARE_PROVIDER_SITE_OTHER): Payer: Medicare Other | Admitting: Family Medicine

## 2015-06-29 ENCOUNTER — Other Ambulatory Visit: Payer: Self-pay | Admitting: *Deleted

## 2015-06-29 ENCOUNTER — Encounter: Payer: Self-pay | Admitting: Family Medicine

## 2015-06-29 VITALS — BP 130/57 | HR 69 | Temp 97.5°F | Ht 66.0 in | Wt 140.6 lb

## 2015-06-29 DIAGNOSIS — E11610 Type 2 diabetes mellitus with diabetic neuropathic arthropathy: Secondary | ICD-10-CM | POA: Diagnosis not present

## 2015-06-29 DIAGNOSIS — E08621 Diabetes mellitus due to underlying condition with foot ulcer: Secondary | ICD-10-CM

## 2015-06-29 DIAGNOSIS — L97502 Non-pressure chronic ulcer of other part of unspecified foot with fat layer exposed: Secondary | ICD-10-CM | POA: Diagnosis not present

## 2015-06-29 DIAGNOSIS — T25022A Burn of unspecified degree of left foot, initial encounter: Secondary | ICD-10-CM

## 2015-06-29 DIAGNOSIS — T25022D Burn of unspecified degree of left foot, subsequent encounter: Secondary | ICD-10-CM | POA: Diagnosis not present

## 2015-06-29 MED ORDER — SILVER SULFADIAZINE 1 % EX CREA: 1.0000 "application " | TOPICAL_CREAM | Freq: Two times a day (BID) | CUTANEOUS | Status: DC

## 2015-06-29 NOTE — Progress Notes (Signed)
Subjective:  [name], female    DOB: 1932/03/13  Age: 82 y.o. MRN: [id number]  CC: Burn   HPI TOMEKIA HELTON presents for wound on left forefoot 1 week ago Was using a heating pad and applied deep heating rub under it. It is a little uncomfortable now, but she did not feel pain when the wound occurred.   History Zarra has a past medical history of Abdominal bloating; Diarrhea; Hemorrhoids; Rectal bleeding; Heme + stool; Bowen's disease; Arthritis; Diverticulosis; Renal cyst; Hiatal hernia; Hyperlipemia; anemia iron deficiency; Neoplasm; CHF (congestive heart failure) ([place]); Cardiomyopathy; COPD (chronic obstructive pulmonary disease) ([place]); Vitamin D deficiency; Rectocele; Mitral valve regurgitation; Nodular goiter; Diabetes mellitus without complication ([place]); Cataract; and Osteoporosis.   She has past surgical history that includes Squamous cell carcinoma excision (1985); Hemorrhoid surgery; Colon surgery; Nasal septum surgery; Abdominal hysterectomy; and Eye surgery.   Her family history includes Alzheimer's disease in her brother and mother; Diabetes in her brother and mother. There is no history of Colon cancer.She reports that she has been smoking Cigarettes.  She started smoking about 62 years ago. She has a 28.5 pack-year smoking history. She has never used smokeless tobacco. She reports that she does not drink alcohol or use illicit drugs.    ROS Review of Systems  Constitutional: Positive for activity change (unable to walk on the lesion). Negative for fever, appetite change and unexpected weight change.  Respiratory: Negative for cough and chest tightness.   Musculoskeletal: Negative for arthralgias.  Psychiatric/Behavioral: Positive for confusion.    Objective:  BP 130/57 mmHg  Pulse 69  Temp(Src) 97.5 F (36.4 C) (Oral)  Ht '5\' 6"'$  (1.676 m)  Wt 140 lb 9.6 oz (63.776 kg)  BMI 22.70 kg/m2  SpO2 97%  BP Readings from Last 3 Encounters:    06/29/15 130/57  06/25/15 128/76  05/16/15 122/68    Wt Readings from Last 3 Encounters:  06/29/15 140 lb 9.6 oz (63.776 kg)  06/25/15 141 lb (63.957 kg)  05/16/15 146 lb 8 oz (66.452 kg)     Physical Exam  Constitutional: She appears well-developed and well-nourished.  HENT:  Head: Normocephalic.  Cardiovascular: Normal rate and regular rhythm.   No murmur heard. Pulmonary/Chest: Effort normal and breath sounds normal.  Musculoskeletal: She exhibits no tenderness.  Skin:  1X3cm grade 2-3 decubitus at lateral forefoot at base of 5th toe. Yellow necrotic matter over most of the lesion, at the margin there remains a 4X6 mm area of charred flesh. Pulses are intact, but sensation diminished.  Psychiatric: Her speech is tangential. Cognition and memory are impaired. She expresses inappropriate judgment. She is inattentive.     Lab Results  Component Value Date   WBC 6.5 02/25/2015   HGB 15.1 10/01/2014   HCT 44.0 02/25/2015   PLT 192 02/25/2015   GLUCOSE 137* 02/25/2015   CHOL 177 02/25/2015   TRIG 147 02/25/2015   HDL 41 02/25/2015   LDLCALC 107* 02/25/2015   ALT 12 02/25/2015   AST 19 02/25/2015   NA 141 02/25/2015   K 3.7 02/25/2015   CL 97 02/25/2015   CREATININE 0.72 02/25/2015   BUN 16 02/25/2015   CO2 30* 02/25/2015   TSH 0.445* 02/25/2015   HGBA1C 7.4* 02/25/2015    No results found.  Assessment & Plan:   Hendy was seen today for burn.  Diagnoses and all orders for this visit:  Burn of foot, left, unspecified degree, initial encounter -  AMB referral to wound care center  Type 2 diabetes mellitus with diabetic neuropathic arthropathy, without long-term current use of insulin (HCC) -     AMB referral to wound care center  Diabetic ulcer of foot associated with diabetes mellitus due to underlying condition, with fat layer exposed ([place])    I am having Ms. Gerilyn Nestle maintain her cyanocobalamin, cholecalciferol, omega-3 acid ethyl esters,  albuterol, levothyroxine, Pitavastatin Calcium, meclizine, albuterol, hydrochlorothiazide, glucose blood, ONETOUCH DELICA LANCETS FINE, ferrous sulfate, acetaminophen, Calcium-Vitamin D-Vitamin K, budesonide, and doxycycline. We will continue to administer cyanocobalamin.  No orders of the defined types were placed in this encounter.     Follow-up: Return in about 1 week (around 07/06/2015) for with Dr. Laurance Flatten or Teesha Ohm, unless seen first at wound center.  Claretta Fraise, M.D.

## 2015-06-29 NOTE — Patient Instructions (Signed)
Burn Care Your skin is a natural barrier to infection. It is the largest organ of your body. Burns damage this natural protection. To help prevent infection, it is very important to follow your caregiver's instructions in the care of your burn. Burns are classified as:  First degree. There is only redness of the skin (erythema). No scarring is expected.  Second degree. There is blistering of the skin. Scarring may occur with deeper burns.  Third degree. All layers of the skin are injured, and scarring is expected. HOME CARE INSTRUCTIONS   Wash your hands well before changing your bandage.  Change your bandage daily.  Remove the old bandage. If the bandage sticks, you may soak it off with cool, clean water.  Cleanse the burn thoroughly but gently with mild soap and water.  Pat the area dry with a clean, dry cloth.  Apply a thick layer  Of silver sulfadiazine  Apply several wraps of thick gauze as instructed by your caregiver.  Keep the bandage as clean and dry as possible.  Elevate the affected area as much as possible  Only take over-the-counter or prescription medicines for pain, discomfort, or fever as directed by your caregiver. SEEK IMMEDIATE MEDICAL CARE IF:   You develop excessive pain.  You develop redness, tenderness, swelling, or red streaks near the burn.  The burned area develops yellowish-white fluid (pus) or a bad smell.  You have a fever. MAKE SURE YOU:   Understand these instructions.  Will watch your condition.  Will get help right away if you are not doing well or get worse.   This information is not intended to replace advice given to you by your health care provider. Make sure you discuss any questions you have with your health care provider.   Document Released: 03/05/2005 Document Revised: 05/28/2011 Document Reviewed: 07/26/2010 Elsevier Interactive Patient Education [employer].

## 2015-07-05 ENCOUNTER — Ambulatory Visit: Payer: Medicare Other | Admitting: Family Medicine

## 2015-07-06 ENCOUNTER — Ambulatory Visit (INDEPENDENT_AMBULATORY_CARE_PROVIDER_SITE_OTHER): Payer: Medicare Other | Admitting: Family Medicine

## 2015-07-06 ENCOUNTER — Encounter: Payer: Self-pay | Admitting: Family Medicine

## 2015-07-06 VITALS — BP 112/66 | Temp 97.7°F | Ht 66.0 in | Wt 139.5 lb

## 2015-07-06 DIAGNOSIS — T25022D Burn of unspecified degree of left foot, subsequent encounter: Secondary | ICD-10-CM | POA: Diagnosis not present

## 2015-07-06 DIAGNOSIS — E08621 Diabetes mellitus due to underlying condition with foot ulcer: Secondary | ICD-10-CM | POA: Diagnosis not present

## 2015-07-06 DIAGNOSIS — T25022A Burn of unspecified degree of left foot, initial encounter: Secondary | ICD-10-CM

## 2015-07-06 DIAGNOSIS — L97502 Non-pressure chronic ulcer of other part of unspecified foot with fat layer exposed: Secondary | ICD-10-CM | POA: Diagnosis not present

## 2015-07-06 DIAGNOSIS — E11610 Type 2 diabetes mellitus with diabetic neuropathic arthropathy: Secondary | ICD-10-CM | POA: Diagnosis not present

## 2015-07-06 NOTE — Progress Notes (Signed)
Subjective:  [name], female    DOB: 05/29/1931  Age: 82 y.o. MRN: [id number]  CC: Burn   HPI Katie Wilkins presents for recheck of burned foot. Daughter dressing daily with silvadene. Hurts a lot. Has not been able to see wound clinic due to network insurance issues.    History Katie Wilkins has a past medical history of Abdominal bloating; Diarrhea; Hemorrhoids; Rectal bleeding; Heme + stool; Bowen's disease; Arthritis; Diverticulosis; Renal cyst; Hiatal hernia; Hyperlipemia; anemia iron deficiency; Neoplasm; CHF (congestive heart failure) ([place]); Cardiomyopathy; COPD (chronic obstructive pulmonary disease) ([place]); Vitamin D deficiency; Rectocele; Mitral valve regurgitation; Nodular goiter; Diabetes mellitus without complication ([place]); Cataract; and Osteoporosis.   She has past surgical history that includes Squamous cell carcinoma excision (1985); Hemorrhoid surgery; Colon surgery; Nasal septum surgery; Abdominal hysterectomy; and Eye surgery.   Her family history includes Alzheimer's disease in her brother and mother; Diabetes in her brother and mother. There is no history of Colon cancer.She reports that she has been smoking Cigarettes.  She started smoking about 62 years ago. She has a 28.5 pack-year smoking history. She has never used smokeless tobacco. She reports that she does not drink alcohol or use illicit drugs.    ROS Review of Systems  Constitutional: Negative for fever, activity change and appetite change.  HENT: Negative for congestion, rhinorrhea and sore throat.   Eyes: Negative for visual disturbance.  Gastrointestinal: Negative for nausea and diarrhea.  Musculoskeletal: Negative for arthralgias.    Objective:  BP 112/66 mmHg  Temp(Src) 97.7 F (36.5 C) (Oral)  Ht '5\' 6"'$  (1.676 m)  Wt 139 lb 8 oz (63.277 kg)  BMI 22.53 kg/m2  BP Readings from Last 3 Encounters:  07/06/15 112/66  06/29/15 130/57  06/25/15 128/76    Wt Readings from Last 3  Encounters:  07/06/15 139 lb 8 oz (63.277 kg)  06/29/15 140 lb 9.6 oz (63.776 kg)  06/25/15 141 lb (63.957 kg)     Physical Exam  Constitutional: She appears well-developed and well-nourished.  HENT:  Head: Normocephalic.  Cardiovascular: Normal rate and regular rhythm.   No murmur heard. Pulmonary/Chest: Effort normal and breath sounds normal.  Skin:  The skin ulcer of the lateral left forefoot, previously described in my note of 4/12 has improved in that the erythema and hyperemia have resolved. There is granulation around the margin, but thick yellow eschar formin throughout the bed of the ulcers.      Lab Results  Component Value Date   WBC 6.5 02/25/2015   HGB 15.1 10/01/2014   HCT 44.0 02/25/2015   PLT 192 02/25/2015   GLUCOSE 137* 02/25/2015   CHOL 177 02/25/2015   TRIG 147 02/25/2015   HDL 41 02/25/2015   LDLCALC 107* 02/25/2015   ALT 12 02/25/2015   AST 19 02/25/2015   NA 141 02/25/2015   K 3.7 02/25/2015   CL 97 02/25/2015   CREATININE 0.72 02/25/2015   BUN 16 02/25/2015   CO2 30* 02/25/2015   TSH 0.445* 02/25/2015   HGBA1C 7.4* 02/25/2015    No results found.  Assessment & Plan:   Katie Wilkins was seen today for burn.  Diagnoses and all orders for this visit:  Burn of foot, left, unspecified degree, initial encounter  Type 2 diabetes mellitus with diabetic neuropathic arthropathy, without long-term current use of insulin (HCC)  Diabetic ulcer of foot associated with diabetes mellitus due to underlying condition, with fat layer exposed ([place])    I discussed expediting Wound  referral. Wound appears to need debridement. The best we could get contacting [facility] center is Friday, April 28. [facility] was to see her today, but out of network approval was pending so she was not seen.   I am having Ms. Gerilyn Nestle maintain her cyanocobalamin, cholecalciferol, omega-3 acid ethyl esters, albuterol, levothyroxine, Pitavastatin Calcium, meclizine, albuterol,  hydrochlorothiazide, glucose blood, ONETOUCH DELICA LANCETS FINE, ferrous sulfate, acetaminophen, Calcium-Vitamin D-Vitamin K, budesonide, doxycycline, and silver sulfADIAZINE. We will continue to administer cyanocobalamin.  No orders of the defined types were placed in this encounter.     Follow-up: Return in about 1 week (around 07/13/2015), or unless seen first at wound center.  Claretta Fraise, M.D.

## 2015-07-08 ENCOUNTER — Other Ambulatory Visit: Payer: Self-pay | Admitting: *Deleted

## 2015-07-08 MED ORDER — ALBUTEROL SULFATE (2.5 MG/3ML) 0.083% IN NEBU: INHALATION_SOLUTION | RESPIRATORY_TRACT | Status: DC

## 2015-07-13 ENCOUNTER — Encounter: Payer: Self-pay | Admitting: Family Medicine

## 2015-07-13 ENCOUNTER — Ambulatory Visit (INDEPENDENT_AMBULATORY_CARE_PROVIDER_SITE_OTHER): Payer: Medicare Other | Admitting: Family Medicine

## 2015-07-13 ENCOUNTER — Telehealth: Payer: Self-pay

## 2015-07-13 VITALS — BP 141/63 | HR 62 | Temp 97.9°F | Ht 66.0 in | Wt 138.0 lb

## 2015-07-13 DIAGNOSIS — E538 Deficiency of other specified B group vitamins: Secondary | ICD-10-CM | POA: Diagnosis not present

## 2015-07-13 DIAGNOSIS — K219 Gastro-esophageal reflux disease without esophagitis: Secondary | ICD-10-CM | POA: Diagnosis not present

## 2015-07-13 DIAGNOSIS — E559 Vitamin D deficiency, unspecified: Secondary | ICD-10-CM

## 2015-07-13 DIAGNOSIS — I10 Essential (primary) hypertension: Secondary | ICD-10-CM

## 2015-07-13 DIAGNOSIS — J449 Chronic obstructive pulmonary disease, unspecified: Secondary | ICD-10-CM | POA: Diagnosis not present

## 2015-07-13 DIAGNOSIS — I739 Peripheral vascular disease, unspecified: Secondary | ICD-10-CM | POA: Diagnosis not present

## 2015-07-13 DIAGNOSIS — E119 Type 2 diabetes mellitus without complications: Secondary | ICD-10-CM

## 2015-07-13 DIAGNOSIS — E11610 Type 2 diabetes mellitus with diabetic neuropathic arthropathy: Secondary | ICD-10-CM | POA: Diagnosis not present

## 2015-07-13 DIAGNOSIS — Z1211 Encounter for screening for malignant neoplasm of colon: Secondary | ICD-10-CM

## 2015-07-13 DIAGNOSIS — E039 Hypothyroidism, unspecified: Secondary | ICD-10-CM

## 2015-07-13 DIAGNOSIS — C211 Malignant neoplasm of anal canal: Secondary | ICD-10-CM

## 2015-07-13 DIAGNOSIS — E785 Hyperlipidemia, unspecified: Secondary | ICD-10-CM

## 2015-07-13 LAB — BAYER DCA HB A1C WAIVED: HB A1C (BAYER DCA - WAIVED): 5.8 % (ref <7.0)

## 2015-07-13 MED ORDER — ALBUTEROL SULFATE (2.5 MG/3ML) 0.083% IN NEBU: INHALATION_SOLUTION | RESPIRATORY_TRACT | Status: DC

## 2015-07-13 NOTE — Patient Instructions (Addendum)
Medicare Annual Wellness Visit  [facility] and the medical providers at [facility] strive to bring you the best medical care.  In doing so we not only want to address your current medical conditions and concerns but also to detect new conditions early and prevent illness, disease and health-related problems.    Medicare offers a yearly Wellness Visit which allows our clinical staff to assess your need for preventative services including immunizations, lifestyle education, counseling to decrease risk of preventable diseases and screening for fall risk and other medical concerns.    This visit is provided free of charge (no copay) for all Medicare recipients. The clinical pharmacists at [facility] have begun to conduct these Wellness Visits which will also include a thorough review of all your medications.    As you primary medical provider recommend that you make an appointment for your Annual Wellness Visit if you have not done so already this year.  You may set up this appointment before you leave today or you may call back ([phone]) and schedule an appointment.  Please make sure when you call that you mention that you are scheduling your Annual Wellness Visit with the clinical pharmacist so that the appointment may be made for the proper length of time.     Continue current medications. Continue good therapeutic lifestyle changes which include good diet and exercise. Fall precautions discussed with patient. If an FOBT was given today- please return it to our front desk. If you are over 90 years old - you may need Prevnar 82 or the adult Pneumonia vaccine.  **Flu shots are available--- please call and schedule a FLU-CLINIC appointment**  After your visit with [place] today you will receive a survey in the mail or online from [employer] regarding your care with [place]. Please take a moment to fill this out. Your feedback is very  important to [place] as you can help [place] better understand your patient needs as well as improve your experience and satisfaction. WE CARE ABOUT YOU!!!   Continue to follow-up with the wound clinic and continue with dressings daily as recommended by them Do not ever use a heating pad again on your body Continue with current nebulizer treatments and if you have any worsening of your breathing you can increase the Pulmicort to twice a day and he can use the albuterol neb as many times as 4 times a day. Drink plenty of fluids and stay well hydrated and use respiratory protection when the pollen count is high Stop smoking

## 2015-07-13 NOTE — Addendum Note (Signed)
Addended by: Zannie Cove on: 07/13/2015 11:11 AM   Modules accepted: Orders

## 2015-07-13 NOTE — Telephone Encounter (Signed)
Patient was authorized to see out of network Dr Nils Pyle Wound care in [place] 4 visits from 07/06/15 to 03/18/16   Auth # [id number]
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
## 2015-07-14 LAB — CBC WITH DIFFERENTIAL/PLATELET
BASOS ABS: 0 10*3/uL (ref 0.0–0.2)
Basos: 0 %
EOS (ABSOLUTE): 0.2 10*3/uL (ref 0.0–0.4)
Eos: 3 %
HEMOGLOBIN: 14.6 g/dL (ref 11.1–15.9)
Hematocrit: 43.6 % (ref 34.0–46.6)
Immature Grans (Abs): 0 10*3/uL (ref 0.0–0.1)
Immature Granulocytes: 0 %
LYMPHS ABS: 2.2 10*3/uL (ref 0.7–3.1)
Lymphs: 25 %
MCH: 27.7 pg (ref 26.6–33.0)
MCHC: 33.5 g/dL (ref 31.5–35.7)
MCV: 83 fL (ref 79–97)
MONOCYTES: 7 %
MONOS ABS: 0.6 10*3/uL (ref 0.1–0.9)
Neutrophils Absolute: 5.7 10*3/uL (ref 1.4–7.0)
Neutrophils: 65 %
Platelets: 204 10*3/uL (ref 150–379)
RBC: 5.27 x10E6/uL (ref 3.77–5.28)
RDW: 14.2 % (ref 12.3–15.4)
WBC: 8.7 10*3/uL (ref 3.4–10.8)

## 2015-07-14 LAB — LIPID PANEL
CHOL/HDL RATIO: 3.5 ratio (ref 0.0–4.4)
Cholesterol, Total: 145 mg/dL (ref 100–199)
HDL: 41 mg/dL (ref >39)
LDL Calculated: 75 mg/dL (ref 0–99)
Triglycerides: 144 mg/dL (ref 0–149)
VLDL Cholesterol Cal: 29 mg/dL (ref 5–40)

## 2015-07-14 LAB — HEPATIC FUNCTION PANEL
ALBUMIN: 4.2 g/dL (ref 3.5–4.7)
ALK PHOS: 56 IU/L (ref 39–117)
ALT: 5 IU/L (ref 0–32)
AST: 11 IU/L (ref 0–40)
BILIRUBIN TOTAL: 0.4 mg/dL (ref 0.0–1.2)
Bilirubin, Direct: 0.12 mg/dL (ref 0.00–0.40)
Total Protein: 6.7 g/dL (ref 6.0–8.5)

## 2015-07-14 LAB — BMP8+EGFR
BUN / CREAT RATIO: 23 (ref 12–28)
BUN: 15 mg/dL (ref 8–27)
CALCIUM: 9.6 mg/dL (ref 8.7–10.3)
CHLORIDE: 97 mmol/L (ref 96–106)
CO2: 28 mmol/L (ref 18–29)
Creatinine, Ser: 0.64 mg/dL (ref 0.57–1.00)
GFR calc Af Amer: 97 mL/min/{1.73_m2} (ref >59)
GFR calc non Af Amer: 84 mL/min/{1.73_m2} (ref >59)
GLUCOSE: 97 mg/dL (ref 65–99)
POTASSIUM: 3.5 mmol/L (ref 3.5–5.2)
Sodium: 144 mmol/L (ref 134–144)

## 2015-07-14 LAB — THYROID PANEL WITH TSH
Free Thyroxine Index: 3.8 (ref 1.2–4.9)
T3 Uptake Ratio: 28 % (ref 24–39)
T4 TOTAL: 13.4 ug/dL — AB (ref 4.5–12.0)
TSH: 0.047 u[IU]/mL — AB (ref 0.450–4.500)

## 2015-07-14 LAB — VITAMIN D 25 HYDROXY (VIT D DEFICIENCY, FRACTURES): Vit D, 25-Hydroxy: 63.7 ng/mL (ref 30.0–100.0)

## 2015-07-15 ENCOUNTER — Encounter (HOSPITAL_BASED_OUTPATIENT_CLINIC_OR_DEPARTMENT_OTHER): Payer: Medicare Other | Attending: Internal Medicine

## 2015-07-15 DIAGNOSIS — M109 Gout, unspecified: Secondary | ICD-10-CM | POA: Insufficient documentation

## 2015-07-15 DIAGNOSIS — F17200 Nicotine dependence, unspecified, uncomplicated: Secondary | ICD-10-CM | POA: Diagnosis not present

## 2015-07-15 DIAGNOSIS — E11621 Type 2 diabetes mellitus with foot ulcer: Secondary | ICD-10-CM | POA: Insufficient documentation

## 2015-07-15 DIAGNOSIS — J449 Chronic obstructive pulmonary disease, unspecified: Secondary | ICD-10-CM | POA: Diagnosis not present

## 2015-07-15 DIAGNOSIS — E1151 Type 2 diabetes mellitus with diabetic peripheral angiopathy without gangrene: Secondary | ICD-10-CM | POA: Insufficient documentation

## 2015-07-15 DIAGNOSIS — L97521 Non-pressure chronic ulcer of other part of left foot limited to breakdown of skin: Secondary | ICD-10-CM | POA: Diagnosis not present

## 2015-07-19 ENCOUNTER — Ambulatory Visit (INDEPENDENT_AMBULATORY_CARE_PROVIDER_SITE_OTHER): Payer: Medicare Other

## 2015-07-19 ENCOUNTER — Ambulatory Visit (INDEPENDENT_AMBULATORY_CARE_PROVIDER_SITE_OTHER): Payer: Medicare Other | Admitting: *Deleted

## 2015-07-19 DIAGNOSIS — M899 Disorder of bone, unspecified: Secondary | ICD-10-CM | POA: Diagnosis not present

## 2015-07-19 DIAGNOSIS — D509 Iron deficiency anemia, unspecified: Secondary | ICD-10-CM

## 2015-07-19 DIAGNOSIS — E538 Deficiency of other specified B group vitamins: Secondary | ICD-10-CM

## 2015-07-19 NOTE — Patient Instructions (Signed)

## 2015-07-19 NOTE — Progress Notes (Signed)
Pt given B12 injection IM left deltoid and tolerated well. °

## 2015-07-21 ENCOUNTER — Other Ambulatory Visit: Payer: Medicare Other

## 2015-07-21 DIAGNOSIS — Z1211 Encounter for screening for malignant neoplasm of colon: Secondary | ICD-10-CM

## 2015-07-22 ENCOUNTER — Encounter (HOSPITAL_BASED_OUTPATIENT_CLINIC_OR_DEPARTMENT_OTHER): Payer: Medicare Other | Attending: Internal Medicine

## 2015-07-22 DIAGNOSIS — E11621 Type 2 diabetes mellitus with foot ulcer: Secondary | ICD-10-CM | POA: Insufficient documentation

## 2015-07-22 DIAGNOSIS — F17200 Nicotine dependence, unspecified, uncomplicated: Secondary | ICD-10-CM | POA: Diagnosis not present

## 2015-07-22 DIAGNOSIS — I509 Heart failure, unspecified: Secondary | ICD-10-CM | POA: Diagnosis not present

## 2015-07-22 DIAGNOSIS — J449 Chronic obstructive pulmonary disease, unspecified: Secondary | ICD-10-CM | POA: Diagnosis not present

## 2015-07-22 DIAGNOSIS — M1990 Unspecified osteoarthritis, unspecified site: Secondary | ICD-10-CM | POA: Insufficient documentation

## 2015-07-22 DIAGNOSIS — L97521 Non-pressure chronic ulcer of other part of left foot limited to breakdown of skin: Secondary | ICD-10-CM | POA: Diagnosis not present

## 2015-07-22 LAB — FECAL OCCULT BLOOD, IMMUNOCHEMICAL: FECAL OCCULT BLD: POSITIVE — AB

## 2015-07-29 DIAGNOSIS — E11621 Type 2 diabetes mellitus with foot ulcer: Secondary | ICD-10-CM | POA: Diagnosis not present

## 2015-08-02 ENCOUNTER — Other Ambulatory Visit: Payer: Self-pay | Admitting: Pharmacist

## 2015-08-03 ENCOUNTER — Other Ambulatory Visit: Payer: Self-pay | Admitting: *Deleted

## 2015-08-03 MED ORDER — GLUCOSE BLOOD VI STRP: ORAL_STRIP | Status: DC

## 2015-08-05 DIAGNOSIS — E11621 Type 2 diabetes mellitus with foot ulcer: Secondary | ICD-10-CM | POA: Diagnosis not present

## 2015-08-12 ENCOUNTER — Other Ambulatory Visit: Payer: Self-pay | Admitting: Internal Medicine

## 2015-08-12 DIAGNOSIS — E11621 Type 2 diabetes mellitus with foot ulcer: Secondary | ICD-10-CM | POA: Diagnosis not present

## 2015-08-12 DIAGNOSIS — L97529 Non-pressure chronic ulcer of other part of left foot with unspecified severity: Secondary | ICD-10-CM

## 2015-08-12 DIAGNOSIS — Z87891 Personal history of nicotine dependence: Secondary | ICD-10-CM

## 2015-08-12 DIAGNOSIS — E138 Other specified diabetes mellitus with unspecified complications: Secondary | ICD-10-CM

## 2015-08-18 ENCOUNTER — Ambulatory Visit
Admission: RE | Admit: 2015-08-18 | Discharge: 2015-08-18 | Disposition: A | Discharge status: Home / Self Care | Payer: Medicare Other | Source: Ambulatory Visit | Attending: Internal Medicine | Admitting: Internal Medicine

## 2015-08-18 DIAGNOSIS — L97529 Non-pressure chronic ulcer of other part of left foot with unspecified severity: Secondary | ICD-10-CM

## 2015-08-18 DIAGNOSIS — E138 Other specified diabetes mellitus with unspecified complications: Secondary | ICD-10-CM

## 2015-08-18 DIAGNOSIS — Z87891 Personal history of nicotine dependence: Secondary | ICD-10-CM

## 2015-08-19 ENCOUNTER — Encounter (HOSPITAL_BASED_OUTPATIENT_CLINIC_OR_DEPARTMENT_OTHER): Payer: Medicare Other | Attending: Internal Medicine

## 2015-08-19 DIAGNOSIS — I509 Heart failure, unspecified: Secondary | ICD-10-CM | POA: Insufficient documentation

## 2015-08-19 DIAGNOSIS — E1151 Type 2 diabetes mellitus with diabetic peripheral angiopathy without gangrene: Secondary | ICD-10-CM | POA: Insufficient documentation

## 2015-08-19 DIAGNOSIS — J449 Chronic obstructive pulmonary disease, unspecified: Secondary | ICD-10-CM | POA: Diagnosis not present

## 2015-08-19 DIAGNOSIS — L97521 Non-pressure chronic ulcer of other part of left foot limited to breakdown of skin: Secondary | ICD-10-CM | POA: Diagnosis not present

## 2015-08-19 DIAGNOSIS — M1990 Unspecified osteoarthritis, unspecified site: Secondary | ICD-10-CM | POA: Diagnosis not present

## 2015-08-19 DIAGNOSIS — F17200 Nicotine dependence, unspecified, uncomplicated: Secondary | ICD-10-CM | POA: Insufficient documentation

## 2015-08-19 DIAGNOSIS — E11621 Type 2 diabetes mellitus with foot ulcer: Secondary | ICD-10-CM | POA: Insufficient documentation

## 2015-08-22 ENCOUNTER — Other Ambulatory Visit: Payer: Self-pay | Admitting: *Deleted

## 2015-08-22 ENCOUNTER — Ambulatory Visit (INDEPENDENT_AMBULATORY_CARE_PROVIDER_SITE_OTHER): Payer: Medicare Other | Admitting: *Deleted

## 2015-08-22 DIAGNOSIS — E538 Deficiency of other specified B group vitamins: Secondary | ICD-10-CM | POA: Diagnosis not present

## 2015-08-22 DIAGNOSIS — I739 Peripheral vascular disease, unspecified: Secondary | ICD-10-CM

## 2015-08-22 DIAGNOSIS — D509 Iron deficiency anemia, unspecified: Secondary | ICD-10-CM | POA: Diagnosis not present

## 2015-08-22 DIAGNOSIS — T25022A Burn of unspecified degree of left foot, initial encounter: Secondary | ICD-10-CM

## 2015-08-22 NOTE — Progress Notes (Signed)
Pt given B12 injection IM right deltoid and tolerated well.

## 2015-08-22 NOTE — Patient Instructions (Signed)

## 2015-08-26 DIAGNOSIS — E11621 Type 2 diabetes mellitus with foot ulcer: Secondary | ICD-10-CM | POA: Diagnosis not present

## 2015-08-29 ENCOUNTER — Encounter: Payer: Self-pay | Admitting: Surgery

## 2015-09-02 ENCOUNTER — Other Ambulatory Visit: Payer: Self-pay | Admitting: Pharmacist

## 2015-09-02 DIAGNOSIS — E11621 Type 2 diabetes mellitus with foot ulcer: Secondary | ICD-10-CM | POA: Diagnosis not present

## 2015-09-05 ENCOUNTER — Encounter: Payer: Self-pay | Admitting: Surgery

## 2015-09-05 ENCOUNTER — Ambulatory Visit (INDEPENDENT_AMBULATORY_CARE_PROVIDER_SITE_OTHER): Payer: Medicare Other | Admitting: Surgery

## 2015-09-05 VITALS — BP 139/60 | HR 61 | Ht 66.0 in | Wt 138.0 lb

## 2015-09-05 DIAGNOSIS — I7025 Atherosclerosis of native arteries of other extremities with ulceration: Secondary | ICD-10-CM
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
## 2015-09-07 ENCOUNTER — Encounter (HOSPITAL_COMMUNITY): Payer: Medicare Other

## 2015-09-09 ENCOUNTER — Other Ambulatory Visit: Payer: Self-pay

## 2015-09-09 DIAGNOSIS — E11621 Type 2 diabetes mellitus with foot ulcer: Secondary | ICD-10-CM | POA: Diagnosis not present

## 2015-09-23 ENCOUNTER — Encounter (HOSPITAL_COMMUNITY)
Admission: RE | Disposition: A | Discharge status: Home / Self Care | Payer: Self-pay | Source: Ambulatory Visit | Attending: Surgery

## 2015-09-23 ENCOUNTER — Ambulatory Visit (HOSPITAL_COMMUNITY)
Admission: RE | Admit: 2015-09-23 | Discharge: 2015-09-23 | Disposition: A | Discharge status: Home / Self Care | Payer: Medicare Other | Source: Ambulatory Visit | Attending: Surgery | Admitting: Surgery

## 2015-09-23 ENCOUNTER — Encounter (HOSPITAL_COMMUNITY): Payer: Self-pay | Admitting: Vascular Surgery

## 2015-09-23 ENCOUNTER — Ambulatory Visit: Payer: Medicare Other

## 2015-09-23 DIAGNOSIS — I70245 Atherosclerosis of native arteries of left leg with ulceration of other part of foot: Secondary | ICD-10-CM | POA: Insufficient documentation

## 2015-09-23 DIAGNOSIS — I429 Cardiomyopathy, unspecified: Secondary | ICD-10-CM | POA: Diagnosis not present

## 2015-09-23 DIAGNOSIS — E559 Vitamin D deficiency, unspecified: Secondary | ICD-10-CM | POA: Diagnosis not present

## 2015-09-23 DIAGNOSIS — D509 Iron deficiency anemia, unspecified: Secondary | ICD-10-CM | POA: Insufficient documentation

## 2015-09-23 DIAGNOSIS — F17210 Nicotine dependence, cigarettes, uncomplicated: Secondary | ICD-10-CM | POA: Diagnosis not present

## 2015-09-23 DIAGNOSIS — E785 Hyperlipidemia, unspecified: Secondary | ICD-10-CM | POA: Insufficient documentation

## 2015-09-23 DIAGNOSIS — L97529 Non-pressure chronic ulcer of other part of left foot with unspecified severity: Secondary | ICD-10-CM | POA: Insufficient documentation

## 2015-09-23 DIAGNOSIS — K449 Diaphragmatic hernia without obstruction or gangrene: Secondary | ICD-10-CM | POA: Insufficient documentation

## 2015-09-23 DIAGNOSIS — M1990 Unspecified osteoarthritis, unspecified site: Secondary | ICD-10-CM | POA: Insufficient documentation

## 2015-09-23 DIAGNOSIS — J449 Chronic obstructive pulmonary disease, unspecified: Secondary | ICD-10-CM | POA: Diagnosis not present

## 2015-09-23 DIAGNOSIS — N281 Cyst of kidney, acquired: Secondary | ICD-10-CM | POA: Diagnosis not present

## 2015-09-23 DIAGNOSIS — I509 Heart failure, unspecified: Secondary | ICD-10-CM | POA: Diagnosis not present

## 2015-09-23 DIAGNOSIS — I7092 Chronic total occlusion of artery of the extremities: Secondary | ICD-10-CM | POA: Diagnosis not present

## 2015-09-23 DIAGNOSIS — R7309 Other abnormal glucose: Secondary | ICD-10-CM | POA: Diagnosis not present

## 2015-09-23 DIAGNOSIS — I7025 Atherosclerosis of native arteries of other extremities with ulceration: Secondary | ICD-10-CM | POA: Diagnosis not present

## 2015-09-23 DIAGNOSIS — M810 Age-related osteoporosis without current pathological fracture: Secondary | ICD-10-CM | POA: Diagnosis not present

## 2015-09-23 DIAGNOSIS — I340 Nonrheumatic mitral (valve) insufficiency: Secondary | ICD-10-CM | POA: Insufficient documentation

## 2015-09-23 HISTORY — PX: PERIPHERAL VASCULAR CATHETERIZATION: SHX172C

## 2015-09-23 LAB — POCT I-STAT, CHEM 8
BUN: 25 mg/dL — AB (ref 6–20)
CALCIUM ION: 1.19 mmol/L (ref 1.12–1.23)
CHLORIDE: 100 mmol/L — AB (ref 101–111)
CREATININE: 0.8 mg/dL (ref 0.44–1.00)
GLUCOSE: 95 mg/dL (ref 65–99)
HCT: 46 % (ref 36.0–46.0)
Hemoglobin: 15.6 g/dL — ABNORMAL HIGH (ref 12.0–15.0)
Potassium: 3.5 mmol/L (ref 3.5–5.1)
SODIUM: 144 mmol/L (ref 135–145)
TCO2: 32 mmol/L (ref 0–100)

## 2015-09-23 LAB — POCT ACTIVATED CLOTTING TIME
ACTIVATED CLOTTING TIME: 235 s
ACTIVATED CLOTTING TIME: 175 s

## 2015-09-23 LAB — GLUCOSE, CAPILLARY: Glucose-Capillary: 93 mg/dL (ref 65–99)

## 2015-09-23 SURGERY — ABDOMINAL AORTOGRAM
Anesthesia: LOCAL

## 2015-09-23 MED ORDER — LIDOCAINE HCL (PF) 1 % IJ SOLN
INTRAMUSCULAR | Status: DC | PRN
  Administered 2015-09-23 (×2): 12 mL

## 2015-09-23 MED ORDER — ASPIRIN 325 MG PO TBEC: 325.0000 mg | DELAYED_RELEASE_TABLET | Freq: Every day | ORAL | Status: DC

## 2015-09-23 MED ORDER — ACETAMINOPHEN 325 MG PO TABS
ORAL_TABLET | ORAL | Status: AC
  Filled 2015-09-23: qty 2

## 2015-09-23 MED ORDER — MORPHINE SULFATE (PF) 10 MG/ML IV SOLN: 2.0000 mg | INTRAVENOUS | Status: DC | PRN

## 2015-09-23 MED ORDER — HEPARIN (PORCINE) IN NACL 2-0.9 UNIT/ML-% IJ SOLN
INTRAMUSCULAR | Status: DC | PRN
  Administered 2015-09-23: 1000 mL

## 2015-09-23 MED ORDER — HEPARIN SODIUM (PORCINE) 1000 UNIT/ML IJ SOLN
INTRAMUSCULAR | Status: DC | PRN
  Administered 2015-09-23: 5000 [IU] via INTRAVENOUS

## 2015-09-23 MED ORDER — SODIUM CHLORIDE 0.9 % IV SOLN
INTRAVENOUS | Status: DC
  Administered 2015-09-23: 08:00:00 via INTRAVENOUS

## 2015-09-23 MED ORDER — HEPARIN SODIUM (PORCINE) 1000 UNIT/ML IJ SOLN
INTRAMUSCULAR | Status: AC
  Filled 2015-09-23: qty 1

## 2015-09-23 MED ORDER — LIDOCAINE HCL (PF) 1 % IJ SOLN
INTRAMUSCULAR | Status: AC
  Filled 2015-09-23: qty 30

## 2015-09-23 MED ORDER — ACETAMINOPHEN 325 MG RE SUPP
325.0000 mg | RECTAL | Status: DC | PRN
  Filled 2015-09-23: qty 2

## 2015-09-23 MED ORDER — ACETAMINOPHEN 325 MG PO TABS
325.0000 mg | ORAL_TABLET | ORAL | Status: DC | PRN
  Administered 2015-09-23: 650 mg via ORAL
  Filled 2015-09-23: qty 2

## 2015-09-23 MED ORDER — IODIXANOL 320 MG/ML IV SOLN
INTRAVENOUS | Status: DC | PRN
  Administered 2015-09-23: 240 mL via INTRAVENOUS

## 2015-09-23 MED ORDER — SODIUM CHLORIDE 0.45 % IV SOLN
INTRAVENOUS | Status: DC
  Administered 2015-09-23: 11:00:00 via INTRAVENOUS

## 2015-09-23 MED ORDER — HEPARIN (PORCINE) IN NACL 2-0.9 UNIT/ML-% IJ SOLN
INTRAMUSCULAR | Status: AC
  Filled 2015-09-23: qty 1000

## 2015-09-23 MED ORDER — ASPIRIN 325 MG PO TABS
ORAL_TABLET | ORAL | Status: DC | PRN
  Administered 2015-09-23: 325 mg via ORAL

## 2015-09-23 MED ORDER — ASPIRIN 325 MG PO TABS
ORAL_TABLET | ORAL | Status: AC
  Filled 2015-09-23: qty 1

## 2015-09-23 SURGICAL SUPPLY — 16 items
CATH OMNI FLUSH 5F 65CM (CATHETERS) ×1 IMPLANT
CATH STRAIGHT 5FR 65CM (CATHETERS) ×1 IMPLANT
COVER PRB 48X5XTLSCP FOLD TPE (BAG) IMPLANT
COVER PROBE 5X48 (BAG) ×8
DEVICE CONTINUOUS FLUSH (MISCELLANEOUS) ×1 IMPLANT
GUIDEWIRE ANGLED .035X150CM (WIRE) ×1 IMPLANT
KIT ENCORE 26 ADVANTAGE (KITS) ×1 IMPLANT
KIT PV (KITS) ×4 IMPLANT
SHEATH PINNACLE 5F 10CM (SHEATH) ×2 IMPLANT
SHEATH PINNACLE ST 7F 45CM (SHEATH) ×1 IMPLANT
STENT LIFESTREAM 7X37X80 (Permanent Stent) ×1 IMPLANT
SYR MEDRAD MARK V 150ML (SYRINGE) ×4 IMPLANT
TAPE RADIOPAQUE TURBO (MISCELLANEOUS) ×1 IMPLANT
TRANSDUCER W/STOPCOCK (MISCELLANEOUS) ×4 IMPLANT
TRAY PV CATH (CUSTOM PROCEDURE TRAY) ×4 IMPLANT
WIRE HITORQ VERSACORE ST 145CM (WIRE) ×1 IMPLANT

## 2015-09-23 NOTE — Interval H&P Note (Signed)
History and Physical Interval Note:  09/23/2015 8:27 AM  Katie Wilkins  has presented today for surgery, with the diagnosis of PVD  The various methods of treatment have been discussed with the patient and family. After consideration of risks, benefits and other options for treatment, the patient has consented to  Procedure(s): Abdominal Aortogram (N/A) Lower Extremity Angiography (Bilateral) as a surgical intervention .  The patient's history has been reviewed, patient examined, no change in status, stable for surgery.  I have reviewed the patient's chart and labs.  Questions were answered to the patient's satisfaction.     Ruta Hinds

## 2015-09-23 NOTE — Discharge Instructions (Signed)

## 2015-09-23 NOTE — H&P (View-Only) (Signed)
HISTORY AND PHYSICAL     CC:  Low pulse in left leg Referring Provider:  Chipper Herb, MD  HPI: This is a 82 y.o. female who presents today for non healing wound on the left foot.  She states that around [place], she put some aspercreme, a copper sock, and a heating pad on her left foot.  When she took this off, she had a burn on her foot.  She was told that she had a 2nd degree burn on the foot.  She has been treated in the wound center since then.  She states that he wound is much better, but has been very slow to heal.  She denies any pain in her foot or claudication sx when she walks.  She states that she does have feeling in her feet.  She states that she is borderline diabetic and this is controlled with diet.   She also states that there is a discrepancy in her right arm and left arm blood pressures with the right arm being significantly higher.   She does smoke ~ 3/4-1ppd of cigarettes and has smoked for 62 years.  She states she does have some wheezing and is on inhalers for this.    She states that she has blood in her stool.  She states that she has never seen it, but it always shows up at the doctors office.  She states that she was originally found to have blood in her stool in 2006 and has had multiple colonoscopies and swallowed the capsule in 2006.  The source of the blood was never found.  At that time, she was taken off of aspirin and has not been on it since.  She states that her stools are black, but she takes iron daily.      Past Medical History  Diagnosis Date  . Abdominal bloating   . Diarrhea   . Hemorrhoids   . Rectal bleeding   . Heme + stool   . Bowen's disease   . Arthritis   . Diverticulosis   . Renal cyst   . Hiatal hernia   . Hyperlipemia   . anemia iron deficiency   . Neoplasm     Malignant, anal canal, squamous cell  . CHF (congestive heart failure) ([place])   . Cardiomyopathy   . COPD (chronic obstructive pulmonary disease) ([place])   . Vitamin D  deficiency   . Rectocele   . Mitral valve regurgitation   . Nodular goiter   . Diabetes mellitus without complication ([place])   . Cataract   . Osteoporosis     Past Surgical History  Procedure Laterality Date  . Squamous cell carcinoma excision  1985    anus resected  . Hemorrhoid surgery    . Colon surgery      excision of cancer  . Nasal septum surgery    . Abdominal hysterectomy      vaginal secondary to fibroids  . Eye surgery      Allergies  Allergen Reactions  . Ace Inhibitors Cough  . Codeine     Current Outpatient Prescriptions  Medication Sig Dispense Refill  . acetaminophen (TYLENOL) 325 MG tablet Take 650 mg by mouth at bedtime.    Marland Kitchen albuterol (PROVENTIL HFA;VENTOLIN HFA) 108 (90 BASE) MCG/ACT inhaler Inhale 1 puff into the lungs as needed.    Marland Kitchen albuterol (PROVENTIL) (2.5 MG/3ML) 0.083% nebulizer solution USE ONE VIAL IN NEBULIZER EVERY 6 HOURS prn Dx. J44.9 225 mL 11  .  budesonide (PULMICORT) 0.5 MG/2ML nebulizer solution Take 2 mLs (0.5 mg total) by nebulization 2 (two) times daily. 120 mL 12  . Calcium-Vitamin D-Vitamin K (CALCIUM + D) [phone] MG-UNT-MCG CHEW Chew 1 tablet by mouth 2 (two) times daily.    . Cephalexin (KEFLEX PO) Take by mouth.    . Cholecalciferol (VITAMIN D3) 1000 UNITS tablet Take 2,000 Units by mouth daily.     . cyanocobalamin (,VITAMIN B-12,) 1000 MCG/ML injection Inject 1,000 mcg into the muscle every 30 (thirty) days.     . ferrous sulfate 325 (65 FE) MG tablet Take 325 mg by mouth daily with breakfast.    . hydrochlorothiazide (HYDRODIURIL) 25 MG tablet TAKE ONE TABLET BY MOUTH ONCE DAILY 90 tablet 3  . levothyroxine (SYNTHROID, LEVOTHROID) 112 MCG tablet Take 112 mcg by mouth daily.    . meclizine (ANTIVERT) 32 MG tablet Take 1 tablet (32 mg total) by mouth 3 (three) times daily as needed. 30 tablet 6  . omega-3 acid ethyl esters (LOVAZA) 1 G capsule Take 1 g by mouth daily.     Glory Rosebush DELICA LANCETS FINE MISC Use  To check  blood glucose once a day.  DX:  Type 2 DM E11.9 100 each 1  . ONETOUCH VERIO test strip USE TO CHECK GLUCOSE ONCE DAILY 100 each 0  . Pitavastatin Calcium (LIVALO) 4 MG TABS Take 1 tablet by mouth daily.     Marland Kitchen doxycycline (VIBRA-TABS) 100 MG tablet Take 1 tablet (100 mg total) by mouth 2 (two) times daily. 1 po bid (Patient not taking: Reported on 09/05/2015) 14 tablet 0  . silver sulfADIAZINE (SILVADENE) 1 % cream Apply 1 application topically 2 (two) times daily. (Patient not taking: Reported on 09/05/2015) 400 g 0   Current Facility-Administered Medications  Medication Dose Route Frequency Provider Last Rate Last Dose  . cyanocobalamin ((VITAMIN B-12)) injection 1,000 mcg  1,000 mcg Intramuscular Q30 days Chipper Herb, MD   1,000 mcg at 08/22/15 1610    Family History  Problem Relation Age of Onset  . Colon cancer Neg Hx   . Diabetes Mother   . Alzheimer's disease Mother   . Diabetes Brother   . Alzheimer's disease Brother     Social History   Social History  . Marital Status: Married    Spouse Name: Joni Fears  . Number of Children: 1  . Years of Education: N/A   Occupational History  . Retired    Social History Main Topics  . Smoking status: Current Every Day Smoker -- 0.50 packs/day for 57 years    Types: Cigarettes    Start date: 03/19/1951  . Smokeless tobacco: Never Used  . Alcohol Use: No  . Drug Use: No  . Sexual Activity: No   Other Topics Concern  . Not on file   Social History Narrative   Patient does not get regular exercise.     REVIEW OF SYSTEMS:   '[X]'$  denotes positive finding, '[ ]'$  denotes negative finding Cardiac  Comments:  Chest pain or chest pressure:    Shortness of breath upon exertion:    Short of breath when lying flat:    Irregular heart rhythm:        Vascular    Pain in calf, thigh, or hip brought on by ambulation:    Pain in feet at night that wakes you up from your sleep:     Blood clot in your veins:    Leg swelling:  Pulmonary    Oxygen at home:    Productive cough:     Wheezing:  x       Neurologic    Sudden weakness in arms or legs:     Sudden numbness in arms or legs:     Sudden onset of difficulty speaking or slurred speech:    Temporary loss of vision in one eye:     Problems with dizziness:         Gastrointestinal    Blood in stool:  x   Vomited blood:         Genitourinary    Burning when urinating:     Blood in urine:        Psychiatric    Major depression:         Hematologic    Bleeding problems:    Problems with blood clotting too easily:        Skin    Rashes or ulcers:        Constitutional    Fever or chills:      PHYSICAL EXAMINATION:  Filed Vitals:   09/05/15 1451  BP: 139/60  Pulse: 61   Body mass index is 22.28 kg/(m^2).  General:  WDWN in NAD; vital signs documented above Gait: Not observed HENT: WNL, normocephalic Pulmonary: normal non-labored breathing , without Rales, rhonchi,  wheezing Cardiac: regular HR, without  Murmurs, rubs or gallops; without carotid bruits Abdomen: soft, NT, no masses Skin: without rashes Vascular Exam/Pulses:  Right Left  Radial 2+ (normal) absent  Ulnar 1+ (weak) absent  Femoral 1+ (weak) 1+ (weak)  DP 1+ (weak) Unable to palpate   PT Unable to palpate  Unable to palpate    Extremities:non healing wound without granulation tissue left lateral foot just distal to the 5th toe.   Musculoskeletal: no muscle wasting or atrophy  Neurologic: A&O X 3;  No focal weakness or paresthesias are detected Psychiatric:  The pt has Normal affect.   Non-Invasive Vascular Imaging:   ABI's at outside facility 08/18/15: Right:  0.97 Left:  0.45  Pt meds includes: Statin:  No. Beta Blocker:  No. Aspirin:  No. ACEI:  No. ARB:  No. Other Antiplatelet/Anticoagulant:  No.    ASSESSMENT/PLAN:: 82 y.o. female with non healing wound on the left lateral foot since March.   -pt has had a wound on her left foot since March and  is slow to heal -discussed proceeding with arteriogram and possible intervention on July 5 by Dr. Trula Slade.  Given the fact that she cannot be placed on antiplatelet therapy given her hx of GIB, intervention may be limited or may fail and may need an operation depending on the arterial anatomy.   -Dr. Trula Slade has discussed this with the pt & her daughter in great detail including risks of the procedure. -Dr. Trula Slade will d/w Dr. 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  She does not have a palpable left radial pulse.  She is asymptomatic from this.  Would not pursue any intervention for this.  Will need to go by right arm blood pressure for accuracy.   Leontine Locket, PA-C [facility] [phone]  Clinic MD:  Pt seen and examined in conjunction with Dr. Trula Slade  I agree with the above.  I have seen and evaluated the patient.  She has a  left foot wound on the lateral aspect of her toe and metatarsal head which is been there since at least April and possibly March.  Vascular lab study showed an ABI of 0.45 on the left.  I discussed with the patient that I feel she is likely having difficulty with wound healing because of her vascular insufficiency.  I have recommended proceeding with angiography to better define her anatomy.  We discussed proceeding with percutaneous intervention if indicated.  She understands the risks and benefits including the risk of bleeding as well as distal embolization which could require surgical intervention.  She also understands that with [place] intervention that there is the possibility of early occlusion and the need for repeat intervention.  The patient has been told she cannot take aspirin because of GI bleeding.  It was stopped in 2006.  She still has frequent blood in  her stool.  The inability to place her on aspirin will limit her cutaneous options, however she would be amenable to taking an aspirin for one month if I do place a stent.  A lot of this will depend on what is found at the time of angiography.  This was done through a right femoral approach.  It has been scheduled for July 5  [facility]

## 2015-09-23 NOTE — Progress Notes (Signed)
Site area: lt groin Site Prior to Removal:  Level  0 Pressure Applied For:  30 minutes Manual:   yes Patient Status During Pull:  stable Post Pull Site:  Level  0 Post Pull Instructions Given:  Yes   Post Pull Pulses Present: yes Dressing Applied:  tegaderm Bedrest begins @  [id number] Comments:

## 2015-09-23 NOTE — Op Note (Signed)
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 The patient was placed supine position the Angio table. Both groins were prepped and draped in usual sterile fashion. Local anesthesia was infiltrated over the right common femoral artery. Ultrasound was used to identify the right common femoral artery. Introducer needle was used to cannulate the right common femoral artery without difficulty after infiltration of local anesthesia. An 035 versacore wire was threaded up in the abdominal aorta and a 5 French sheath placed over the versacore wire into the right common femoral artery and thoroughly flushed with heparinized saline. A 5 French Omni Flush catheter was advanced over the guidewire and an abdominal aortogram was obtained through the [employer]. The infrarenal abdominal aorta is patent. The left and right renal arteries are patent. The right common iliac external iliac and internal iliac arteries patent. There is an occlusion of the left common iliac artery. There is reconstitution of the internal and external iliac artery distally via aortic and internal iliac collaterals. The Omni Flush catheter was pulled down just above the aortic bifurcation and bilateral oblique views of the pelvis were obtained to confirm the above findings. Next bilateral lower extremity runoff views were obtained through the Omni Flush  catheter.  In the left lower extremity, the left common femoral artery is patent. The profunda is quite large and patent. The left superficial femoral artery has a flush occlusion. There is reconstitution of the above-knee popliteal artery via collaterals. There is two-vessel runoff to the left foot via the anterior tibial artery and a diminutive peroneal artery.  In the right lower extremity, the right common femoral artery is patent. The right profunda and superficial femoral arteries are patent. There is a 70% stenosis of the popliteal artery behind the knee joint. There is two-vessel runoff to the right foot via the anterior tibial and peroneal arteries.  At this point it was decided to intervene on the left common iliac occlusion. Local anesthesia was infiltrated over the left common femoral artery. Ultrasound was used to identify the left common femoral artery. An introducer needle was then used to cannulate the left common femoral artery and an 035 versacore wire advanced up into the left common iliac artery under fluoroscopic guidance. A 5 French sheath was then placed over the guidewire and the left common femoral artery and thoroughly flushed with heparinized saline. The patient was given 5000 units of intravenous heparin. ACT was confirmed to be greater than 200. An 035 angled Glidewire and a 5 French straight catheter was  advanced up to the level of the occlusion. The Glidewire was refluxed on itself and using a push technique with the 5 French straight catheter I was able to recanalize the left common iliac artery and advanced the catheter up in the abdominal aorta. This was confirmed with contrast angiogram. The Glidewire was then replaced with an 035 versacore wire. The 5 French sheath was exchanged in the left groin for a 7 [place] destination sheath. Magnified views of the lower portion of the abdominal aorta and iliac bifurcation were obtained. A 7 x 37 life Stream stent was selected. This was  then positioned at the level of the aortic bifurcation and deployed to 8 atm for 1 minute. Completion arteriogram showed widely patent left common iliac artery with no evidence of dissection and preservation of the internal iliac artery on the left side. At this point the guidewires and the Omni Flush catheter were removed. The sheaths were left in place to be pulled after the ACT is less than 175. The patient tolerated the procedure well and there were no complications. The patient was taken to the holding area in stable condition.  Operative management: The patient has now had recanalization and left common iliac stenting which should improve the perfusion to her left lower extremity significantly. She does have a chronic SFA occlusion and if wound healing does not occur with this intervention consideration could be given for possible left superficial femoral artery angioplasty or stenting or potentially left femoral to above-knee popliteal bypass. The patient will follow-up with Dr. Trula Slade for further management.  Ruta Hinds, MD [facility] [place] Office: [phone] Pager: [phone]

## 2015-09-23 NOTE — Progress Notes (Signed)
Site area: rt groin Site Prior to Removal:  Level  0 Pressure Applied For:  20 minutes Manual:   yes Patient Status During Pull:  stable Post Pull Site:  Level  0 Post Pull Instructions Given:  yes Post Pull Pulses Present: yes Dressing Applied:  tegaderm Bedrest begins @  Comments:

## 2015-09-26 ENCOUNTER — Ambulatory Visit (INDEPENDENT_AMBULATORY_CARE_PROVIDER_SITE_OTHER): Payer: Medicare Other | Admitting: *Deleted

## 2015-09-26 DIAGNOSIS — E538 Deficiency of other specified B group vitamins: Secondary | ICD-10-CM | POA: Diagnosis not present

## 2015-09-26 NOTE — Progress Notes (Signed)
Pt given B12 injection IM left deltoid and tolerated well. °

## 2015-09-30 ENCOUNTER — Encounter (HOSPITAL_BASED_OUTPATIENT_CLINIC_OR_DEPARTMENT_OTHER): Payer: Medicare Other | Attending: Internal Medicine

## 2015-09-30 DIAGNOSIS — E11621 Type 2 diabetes mellitus with foot ulcer: Secondary | ICD-10-CM | POA: Insufficient documentation

## 2015-09-30 DIAGNOSIS — I70201 Unspecified atherosclerosis of native arteries of extremities, right leg: Secondary | ICD-10-CM | POA: Diagnosis not present

## 2015-09-30 DIAGNOSIS — L97521 Non-pressure chronic ulcer of other part of left foot limited to breakdown of skin: Secondary | ICD-10-CM | POA: Insufficient documentation

## 2015-09-30 DIAGNOSIS — I779 Disorder of arteries and arterioles, unspecified: Secondary | ICD-10-CM | POA: Insufficient documentation

## 2015-09-30 DIAGNOSIS — F17210 Nicotine dependence, cigarettes, uncomplicated: Secondary | ICD-10-CM | POA: Diagnosis not present

## 2015-09-30 DIAGNOSIS — Z95820 Peripheral vascular angioplasty status with implants and grafts: Secondary | ICD-10-CM | POA: Insufficient documentation

## 2015-09-30 DIAGNOSIS — J449 Chronic obstructive pulmonary disease, unspecified: Secondary | ICD-10-CM | POA: Diagnosis not present

## 2015-10-14 DIAGNOSIS — E11621 Type 2 diabetes mellitus with foot ulcer: Secondary | ICD-10-CM | POA: Diagnosis not present

## 2015-10-14 DIAGNOSIS — L97521 Non-pressure chronic ulcer of other part of left foot limited to breakdown of skin: Secondary | ICD-10-CM | POA: Diagnosis not present

## 2015-10-20 ENCOUNTER — Encounter: Payer: Self-pay | Admitting: Surgery

## 2015-10-24 ENCOUNTER — Ambulatory Visit (INDEPENDENT_AMBULATORY_CARE_PROVIDER_SITE_OTHER): Payer: Medicare Other | Admitting: Surgery

## 2015-10-24 ENCOUNTER — Encounter: Payer: Self-pay | Admitting: Surgery

## 2015-10-24 VITALS — BP 119/57 | HR 69 | Temp 97.1°F | Resp 20 | Ht 66.0 in | Wt 136.0 lb

## 2015-10-24 DIAGNOSIS — S81802D Unspecified open wound, left lower leg, subsequent encounter: Secondary | ICD-10-CM

## 2015-10-24 DIAGNOSIS — I7025 Atherosclerosis of native arteries of other extremities with ulceration: Secondary | ICD-10-CM | POA: Diagnosis not present

## 2015-10-24 NOTE — Progress Notes (Signed)
HISTORY AND PHYSICAL     CC:  Follow up from left iliac stent Referring Provider:  Chipper Herb, MD  HPI: This is a 82 y.o. female who presented in June with a non healing wound on the left foot from putting aspercreme, a copper sock, and a heating pad on her left foot.  When she took this off, she had a burn on her foot.  She was told that she had a 2nd degree burn on the foot.  She has been treated in the wound center since then.    On September 23, 2015, she underwent aortogram with left CIA stent placement.  She returns today for follow up.  She states that she is doing well and that her wound is getting better.  She continues to go to the wound care center.  She is taking an aspirin '325mg'$  daily.  She states that she does have dark stools, but continues to be on iron.  She has had + hemoccults and multiple workups, but a source has not ever been found.  She denies any fatigue, dizziness or syncope or near syncope.  Her blood pressures also have not changed.  Otherwise, there are no significant changes in her medical hx.  She does continue to smoke.      Past Medical History:  Diagnosis Date  . Abdominal bloating   . anemia iron deficiency   . Arthritis   . Bowen's disease   . Cardiomyopathy   . Cataract   . CHF (congestive heart failure) ([place])   . COPD (chronic obstructive pulmonary disease) ([place])   . Diabetes mellitus without complication ([facility])   . Diarrhea   . Diverticulosis   . Heme + stool   . Hemorrhoids   . Hiatal hernia   . Hyperlipemia   . Mitral valve regurgitation   . Neoplasm    Malignant, anal canal, squamous cell  . Nodular goiter   . Osteoporosis   . Rectal bleeding   . Rectocele   . Renal cyst   . Vitamin D deficiency     Past Surgical History:  Procedure Laterality Date  . ABDOMINAL HYSTERECTOMY     vaginal secondary to fibroids  . COLON SURGERY     excision of cancer  . EYE SURGERY    . HEMORRHOID SURGERY    . NASAL SEPTUM SURGERY    . PERIPHERAL  VASCULAR CATHETERIZATION N/A 09/23/2015   Procedure: Abdominal Aortogram;  Surgeon: Elam Dutch, MD;  Location: [facility];  Service: Cardiovascular;  Laterality: N/A;  . PERIPHERAL VASCULAR CATHETERIZATION Bilateral 09/23/2015   Procedure: Lower Extremity Angiography;  Surgeon: Elam Dutch, MD;  Location: [facility];  Service: Cardiovascular;  Laterality: Bilateral;  . PERIPHERAL VASCULAR CATHETERIZATION Left 09/23/2015   Procedure: Peripheral Vascular Intervention;  Surgeon: Elam Dutch, MD;  Location: [facility];  Service: Cardiovascular;  Laterality: Left;  common iliac stent  . SQUAMOUS CELL CARCINOMA EXCISION  1985   anus resected    Allergies  Allergen Reactions  . Ace Inhibitors Cough  . Codeine Other (See Comments)    Sleepy and crazy    Current Outpatient Prescriptions  Medication Sig Dispense Refill  . acetaminophen (TYLENOL) 325 MG tablet Take 650 mg by mouth at bedtime.    Marland Kitchen albuterol (PROVENTIL HFA;VENTOLIN HFA) 108 (90 BASE) MCG/ACT inhaler Inhale 1 puff into the lungs every 4 (four) hours as needed for wheezing.     Marland Kitchen albuterol (PROVENTIL) (2.5  MG/3ML) 0.083% nebulizer solution USE ONE VIAL IN NEBULIZER EVERY 6 HOURS prn Dx. J44.9 (Patient taking differently: Take 2.5 mg by nebulization every 6 (six) hours as needed for wheezing or shortness of breath. ) 225 mL 11  . aspirin EC 325 MG EC tablet Take 1 tablet (325 mg total) by mouth daily. 30 tablet 12  . budesonide (PULMICORT) 0.5 MG/2ML nebulizer solution Take 2 mLs (0.5 mg total) by nebulization 2 (two) times daily. 120 mL 12  . Calcium-Vitamin D-Vitamin K (CALCIUM + D) [phone] MG-UNT-MCG CHEW Chew 1 tablet by mouth 2 (two) times daily.    . Cholecalciferol (VITAMIN D3) 1000 UNITS tablet Take 2,000 Units by mouth daily.     . cyanocobalamin (,VITAMIN B-12,) 1000 MCG/ML injection Inject 1,000 mcg into the muscle every 30 (thirty) days.     . ferrous sulfate 325 (65 FE) MG tablet Take  325 mg by mouth 3 (three) times a week.     . hydrochlorothiazide (HYDRODIURIL) 25 MG tablet TAKE ONE TABLET BY MOUTH ONCE DAILY (Patient taking differently: Take 25 mg by mouth daily. ) 90 tablet 3  . levothyroxine (SYNTHROID, LEVOTHROID) 112 MCG tablet Take 112 mcg by mouth daily.    . meclizine (ANTIVERT) 32 MG tablet Take 1 tablet (32 mg total) by mouth 3 (three) times daily as needed. (Patient taking differently: Take 32 mg by mouth 3 (three) times daily as needed for dizziness. ) 30 tablet 6  . omega-3 acid ethyl esters (LOVAZA) 1 G capsule Take 1 g by mouth daily.     Glory Rosebush DELICA LANCETS FINE MISC Use  To check blood glucose once a day.  DX:  Type 2 DM E11.9 100 each 1  . ONETOUCH VERIO test strip USE TO CHECK GLUCOSE ONCE DAILY 100 each 0  . Pitavastatin Calcium (LIVALO) 4 MG TABS Take 4 mg by mouth daily.     Marland Kitchen doxycycline (VIBRA-TABS) 100 MG tablet Take 1 tablet (100 mg total) by mouth 2 (two) times daily. 1 po bid (Patient not taking: Reported on 09/05/2015) 14 tablet 0  . silver sulfADIAZINE (SILVADENE) 1 % cream Apply 1 application topically 2 (two) times daily. (Patient not taking: Reported on 09/05/2015) 400 g 0   Current Facility-Administered Medications  Medication Dose Route Frequency Provider Last Rate Last Dose  . cyanocobalamin ((VITAMIN B-12)) injection 1,000 mcg  1,000 mcg Intramuscular Q30 days Chipper Herb, MD   1,000 mcg at 09/26/15 1446    Family History  Problem Relation Age of Onset  . Colon cancer Neg Hx   . Diabetes Mother   . Alzheimer's disease Mother   . Diabetes Brother   . Alzheimer's disease Brother     Social History   Social History  . Marital status: Married    Spouse name: Joni Fears  . Number of children: 1  . Years of education: N/A   Occupational History  . Retired    Social History Main Topics  . Smoking status: Current Every Day Smoker    Packs/day: 1.00    Years: 57.00    Types: Cigarettes    Start date: 03/19/1953  .  Smokeless tobacco: Never Used  . Alcohol use No  . Drug use: No  . Sexual activity: No   Other Topics Concern  . Not on file   Social History Narrative   Patient does not get regular exercise.     REVIEW OF SYSTEMS:   '[X]'$  denotes positive finding, '[ ]'$  denotes negative finding  Cardiac  Comments:  Chest pain or chest pressure:    Shortness of breath upon exertion:    Short of breath when lying flat:    Irregular heart rhythm:        Vascular    Pain in calf, thigh, or hip brought on by ambulation:    Pain in feet at night that wakes you up from your sleep:     Blood clot in your veins:    Leg swelling:         Pulmonary    Oxygen at home:    Productive cough:     Wheezing:         Neurologic    Sudden weakness in arms or legs:     Sudden numbness in arms or legs:     Sudden onset of difficulty speaking or slurred speech:    Temporary loss of vision in one eye:     Problems with dizziness:         Gastrointestinal    Blood in stool:     Vomited blood:         Genitourinary    Burning when urinating:     Blood in urine:        Psychiatric    Major depression:         Hematologic    Bleeding problems:    Problems with blood clotting too easily:        Skin    Rashes or ulcers: x       Constitutional    Fever or chills:      PHYSICAL EXAMINATION:  Vitals:   10/24/15 1334  BP: (!) 119/57  Pulse: 69  Resp: 20  Temp: 97.1 F (36.2 C)   Body mass index is 21.95 kg/m.  General:  WDWN in NAD; vital signs documented above Gait: Not observed HENT: WNL, normocephalic Pulmonary: normal non-labored breathing , without Rales, rhonchi,  wheezing Cardiac: regular HR Abdomen: soft, non tender Skin: without rashes Vascular Exam/Pulses:  Left  Femoral 2+ (normal)  DP 2+ (normal)  PT Unable to palpate    Extremities: wound on lateral aspect of left foot has healed significantly Musculoskeletal: no muscle wasting or atrophy  Neurologic: A&O X 3;  No  focal weakness or paresthesias are detected Psychiatric:  The pt has Normal affect.   Non-Invasive Vascular Imaging:   None today  Pt meds includes: Statin:  No. Beta Blocker:  No. Aspirin:  Yes.   ACEI:  No. ARB:  No. Other Antiplatelet/Anticoagulant:  No.    ASSESSMENT/PLAN:: 82 y.o. female with non healing wound who is s/p placement of left CIA stent 09/23/15 by Dr. Oneida Alar and returns today for follow up.     -the wound on the left lateral foot has significantly healed. She does have a palpable left DP pulse. -she is tolerating the adult aspirin and does not have any sx of this despite having  + hemoccults in the past -will change her aspirin '325mg'$  aspirin to '81mg'$ .  -will have her f/u in 3 months with Dr. Trula Slade for aorto-iliac duplex (left) and ABI's in 3 months.  She will call sooner if she has any issues.     Leontine Locket, PA-C [facility] [phone]  Clinic MD:  Pt seen and examined in conjunction with Dr. Trula Slade    I agree with the above.  I have seen and evaluated the patient.  She presented with a left foot ulcer.  She underwent  angiography which revealed an occluded left common iliac artery.  This was successfully crossed and stented.  She also has a left superficial femoral artery occlusion.  On examination she is a palpable pedal pulse.  Her wound is starting to improve.  I would not recommend intervention on her left superficial femoral artery at this time as she has a palpable pulse.  I recommended switching back to a 81 mg aspirin.  She will follow-up with me in 3 months for surveillance imaging  Wells Brabham

## 2015-10-28 ENCOUNTER — Ambulatory Visit: Payer: Medicare Other

## 2015-10-28 ENCOUNTER — Encounter (HOSPITAL_BASED_OUTPATIENT_CLINIC_OR_DEPARTMENT_OTHER): Payer: Medicare Other | Attending: Internal Medicine

## 2015-10-28 DIAGNOSIS — L97521 Non-pressure chronic ulcer of other part of left foot limited to breakdown of skin: Secondary | ICD-10-CM | POA: Diagnosis not present

## 2015-10-28 DIAGNOSIS — E11621 Type 2 diabetes mellitus with foot ulcer: Secondary | ICD-10-CM | POA: Insufficient documentation

## 2015-10-28 DIAGNOSIS — F17210 Nicotine dependence, cigarettes, uncomplicated: Secondary | ICD-10-CM | POA: Diagnosis not present

## 2015-10-28 DIAGNOSIS — J449 Chronic obstructive pulmonary disease, unspecified: Secondary | ICD-10-CM | POA: Diagnosis not present

## 2015-10-28 DIAGNOSIS — X35XXXA Volcanic eruption, initial encounter: Secondary | ICD-10-CM | POA: Diagnosis not present

## 2015-10-28 DIAGNOSIS — Z95820 Peripheral vascular angioplasty status with implants and grafts: Secondary | ICD-10-CM | POA: Diagnosis not present

## 2015-10-28 DIAGNOSIS — M1990 Unspecified osteoarthritis, unspecified site: Secondary | ICD-10-CM | POA: Insufficient documentation

## 2015-10-28 DIAGNOSIS — I509 Heart failure, unspecified: Secondary | ICD-10-CM | POA: Insufficient documentation

## 2015-10-31 ENCOUNTER — Ambulatory Visit (INDEPENDENT_AMBULATORY_CARE_PROVIDER_SITE_OTHER): Payer: Medicare Other | Admitting: *Deleted

## 2015-10-31 DIAGNOSIS — E538 Deficiency of other specified B group vitamins: Secondary | ICD-10-CM

## 2015-10-31 MED ORDER — CYANOCOBALAMIN 1000 MCG/ML IJ SOLN
1000.0000 ug | INTRAMUSCULAR | Status: AC
  Administered 2015-10-31 – 2016-10-19 (×12): 1000 ug via INTRAMUSCULAR

## 2015-10-31 NOTE — Progress Notes (Signed)
Pt given B12 injection IM right deltoid and tolerated well.

## 2015-11-07 NOTE — Addendum Note (Signed)
Addended by: Mena Goes on: 11/07/2015 05:33 PM   Modules accepted: Orders

## 2015-11-11 DIAGNOSIS — L97521 Non-pressure chronic ulcer of other part of left foot limited to breakdown of skin: Secondary | ICD-10-CM | POA: Diagnosis not present

## 2015-11-11 DIAGNOSIS — E11621 Type 2 diabetes mellitus with foot ulcer: Secondary | ICD-10-CM | POA: Diagnosis not present

## 2015-11-15 DIAGNOSIS — J449 Chronic obstructive pulmonary disease, unspecified: Secondary | ICD-10-CM | POA: Diagnosis not present

## 2015-11-16 ENCOUNTER — Encounter: Payer: Self-pay | Admitting: Family Medicine

## 2015-11-16 ENCOUNTER — Ambulatory Visit (INDEPENDENT_AMBULATORY_CARE_PROVIDER_SITE_OTHER): Payer: Medicare Other | Admitting: Family Medicine

## 2015-11-16 VITALS — BP 119/57 | HR 65 | Temp 97.1°F | Ht 66.0 in | Wt 135.0 lb

## 2015-11-16 DIAGNOSIS — I10 Essential (primary) hypertension: Secondary | ICD-10-CM | POA: Diagnosis not present

## 2015-11-16 DIAGNOSIS — H578 Other specified disorders of eye and adnexa: Secondary | ICD-10-CM

## 2015-11-16 DIAGNOSIS — E785 Hyperlipidemia, unspecified: Secondary | ICD-10-CM | POA: Diagnosis not present

## 2015-11-16 DIAGNOSIS — E039 Hypothyroidism, unspecified: Secondary | ICD-10-CM

## 2015-11-16 DIAGNOSIS — H5789 Other specified disorders of eye and adnexa: Secondary | ICD-10-CM

## 2015-11-16 DIAGNOSIS — E11610 Type 2 diabetes mellitus with diabetic neuropathic arthropathy: Secondary | ICD-10-CM | POA: Diagnosis not present

## 2015-11-16 DIAGNOSIS — K219 Gastro-esophageal reflux disease without esophagitis: Secondary | ICD-10-CM

## 2015-11-16 DIAGNOSIS — H109 Unspecified conjunctivitis: Secondary | ICD-10-CM

## 2015-11-16 DIAGNOSIS — J449 Chronic obstructive pulmonary disease, unspecified: Secondary | ICD-10-CM

## 2015-11-16 DIAGNOSIS — E559 Vitamin D deficiency, unspecified: Secondary | ICD-10-CM

## 2015-11-16 DIAGNOSIS — I739 Peripheral vascular disease, unspecified: Secondary | ICD-10-CM | POA: Diagnosis not present

## 2015-11-16 LAB — BAYER DCA HB A1C WAIVED: HB A1C (BAYER DCA - WAIVED): 5.9 %

## 2015-11-16 MED ORDER — POLYMYXIN B-TRIMETHOPRIM 10000-0.1 UNIT/ML-% OP SOLN: 1.0000 [drp] | OPHTHALMIC | 0 refills | Status: DC

## 2015-11-16 MED ORDER — POLYMYXIN B-TRIMETHOPRIM 10000-0.1 UNIT/ML-% OP SOLN: 1.0000 [drp] | OPHTHALMIC | Status: DC

## 2015-11-16 NOTE — Progress Notes (Signed)
Subjective:    [name], female    DOB: [birth year], 82 y.o.   MRN: [id number]  HPI Pt here for follow up and management of chronic medical problems which includes diabetes, hypothyroid, and hyperlipidemia. She is taking medications regularly.The patient comes with his daughter to the visit today. She is recently had a stent placed in the left femoral artery area. This was done because the wound on her left foot was not healing due to poor circulation. The wound appears to be healing and it is being dressed daily by the patient's son-in-law. She has a return appointment scheduled with the vascular surgeon at some point in the future. She is still smoking. She denies any chest pain or shortness of breath anymore than usual. She is passing her water without problems and having no problems with her GI tract.       Patient Active Problem List   Diagnosis Date Noted  . Type 2 diabetes mellitus treated without insulin ([place]) 03/11/2015  . Peripheral vascular insufficiency ([place]) 09/21/2013  . Hip pain 09/21/2013  . Osteoporosis, senile 07/15/2013  . Vitamin D deficiency   . Anemia, iron deficiency 08/22/2010  . GERD (gastroesophageal reflux disease) 08/22/2010  . Guaiac positive stools 08/22/2010  . BOWEN'S DISEASE 12/24/2008  . Hypothyroidism 12/24/2008  . HEMORRHOIDS 12/24/2008  . RECTAL BLEEDING 12/24/2008  . ARTHRITIS 12/24/2008  . NEOPLASM, MALIGNANT, ANAL CANAL, SQUAMOUS CELL 12/23/2008  . Hyperlipidemia 12/23/2008  . CARDIOMYOPATHY 12/23/2008  . CHF 12/23/2008  . COPD (chronic obstructive pulmonary disease) with chronic bronchitis ([place]) 12/23/2008  . HIATAL HERNIA 12/23/2008  . DIVERTICULOSIS, COLON 12/23/2008  . RENAL CYST 12/23/2008   Outpatient Encounter Prescriptions as of 11/16/2015  Medication Sig  . acetaminophen (TYLENOL) 325 MG tablet Take 650 mg by mouth at bedtime.  Marland Kitchen albuterol (PROVENTIL HFA;VENTOLIN HFA) 108 (90 BASE) MCG/ACT inhaler Inhale 1  puff into the lungs every 4 (four) hours as needed for wheezing.   Marland Kitchen albuterol (PROVENTIL) (2.5 MG/3ML) 0.083% nebulizer solution USE ONE VIAL IN NEBULIZER EVERY 6 HOURS prn Dx. J44.9 (Patient taking differently: Take 2.5 mg by nebulization every 6 (six) hours as needed for wheezing or shortness of breath. )  . aspirin EC 81 MG tablet Take 81 mg by mouth daily.  . budesonide (PULMICORT) 0.5 MG/2ML nebulizer solution Take 2 mLs (0.5 mg total) by nebulization 2 (two) times daily.  . Calcium-Vitamin D-Vitamin K (CALCIUM + D) [phone] MG-UNT-MCG CHEW Chew 1 tablet by mouth 2 (two) times daily.  . Cholecalciferol (VITAMIN D3) 1000 UNITS tablet Take 2,000 Units by mouth daily.   . cyanocobalamin (,VITAMIN B-12,) 1000 MCG/ML injection Inject 1,000 mcg into the muscle every 30 (thirty) days.   . ferrous sulfate 325 (65 FE) MG tablet Take 325 mg by mouth 3 (three) times a week.   . hydrochlorothiazide (HYDRODIURIL) 25 MG tablet TAKE ONE TABLET BY MOUTH ONCE DAILY (Patient taking differently: Take 25 mg by mouth daily. )  . levothyroxine (SYNTHROID, LEVOTHROID) 112 MCG tablet Take 112 mcg by mouth daily.  . meclizine (ANTIVERT) 32 MG tablet Take 1 tablet (32 mg total) by mouth 3 (three) times daily as needed. (Patient taking differently: Take 32 mg by mouth 3 (three) times daily as needed for dizziness. )  . omega-3 acid ethyl esters (LOVAZA) 1 G capsule Take 1 g by mouth daily.   Glory Rosebush DELICA LANCETS FINE MISC Use  To check blood glucose once a day.  DX:  Type 2  DM E11.9  . ONETOUCH VERIO test strip USE TO CHECK GLUCOSE ONCE DAILY  . Pitavastatin Calcium (LIVALO) 4 MG TABS Take 4 mg by mouth daily.   . [DISCONTINUED] aspirin EC 325 MG EC tablet Take 1 tablet (325 mg total) by mouth daily.  . [DISCONTINUED] doxycycline (VIBRA-TABS) 100 MG tablet Take 1 tablet (100 mg total) by mouth 2 (two) times daily. 1 po bid (Patient not taking: Reported on 09/05/2015)  . [DISCONTINUED] silver sulfADIAZINE  (SILVADENE) 1 % cream Apply 1 application topically 2 (two) times daily. (Patient not taking: Reported on 09/05/2015)   Facility-Administered Encounter Medications as of 11/16/2015  Medication  . cyanocobalamin ((VITAMIN B-12)) injection 1,000 mcg     Review of Systems  Constitutional: Negative.   HENT: Negative.        Right side nasal sores  Eyes: Positive for discharge (left eye drainage).  Respiratory: Negative.   Cardiovascular: Negative.   Gastrointestinal: Negative.   Endocrine: Negative.   Genitourinary: Negative.   Musculoskeletal: Negative.   Skin: Negative.   Allergic/Immunologic: Negative.   Neurological: Negative.   Hematological: Negative.   Psychiatric/Behavioral: Negative.        Objective:   Physical Exam  Constitutional: She is oriented to person, place, and time. She appears well-nourished. No distress.  Patient is elderly appearing but alert and pleasant  HENT:  Head: Normocephalic and atraumatic.  Right Ear: External ear normal.  Left Ear: External ear normal.  Mouth/Throat: Oropharynx is clear and moist.  There is no nasal irritation noted in the right nostril.  Eyes: Conjunctivae and EOM are normal. Pupils are equal, round, and reactive to light. Right eye exhibits no discharge. Left eye exhibits no discharge. No scleral icterus.  The left eye was slightly swollen and with more conjunctivitis and redness than the right. There is no drainage or discharge.  Neck: Normal range of motion. Neck supple. No thyromegaly present.  Cardiovascular: Normal rate, regular rhythm and normal heart sounds.  Exam reveals no friction rub.   No murmur heard. The heart had a regular rate and rhythm at 72/m  Pulmonary/Chest: Effort normal and breath sounds normal. No respiratory distress. She has no wheezes. She has no rales.  Abdominal: Soft. Bowel sounds are normal. She exhibits no mass. There is no tenderness. There is no rebound and no guarding.  Musculoskeletal: Normal  range of motion. She exhibits no edema.  Lymphadenopathy:    She has no cervical adenopathy.  Neurological: She is alert and oriented to person, place, and time. She has normal reflexes. No cranial nerve deficit.  Skin: Skin is warm and dry. No rash noted.  Psychiatric: She has a normal mood and affect. Her behavior is normal. Judgment and thought content normal.  Nursing note and vitals reviewed.  BP (!) 119/57 (BP Location: Right Arm)   Pulse 65   Temp 97.1 F (36.2 C) (Oral)   Ht '5\' 6"'  (1.676 m)   Wt 135 lb (61.2 kg)   BMI 21.79 kg/m         Assessment & Plan:  1. Type 2 diabetes mellitus with diabetic neuropathic arthropathy, without long-term current use of insulin (HCC) -Continue current treatment and therapeutic lifestyle changes - Bayer DCA Hb A1c Waived - BMP8+EGFR - CBC with Differential/Platelet  2. Essential hypertension -The blood pressure is good today and she should continue with sodium restriction and current treatment. - BMP8+EGFR - CBC with Differential/Platelet - Hepatic function panel  3. Hyperlipemia -Continue with Livalo pending results of  lab work and omega-3 fatty acids along with therapeutic lifestyle changes - CBC with Differential/Platelet - NMR, lipoprofile  4. Vitamin D deficiency -Continue with vitamin D replacement - CBC with Differential/Platelet - VITAMIN D 25 Hydroxy (Vit-D Deficiency, Fractures)  5. Hypothyroidism, unspecified hypothyroidism type -Continue with levothyroxine. Pending results of lab work. - CBC with Differential/Platelet - Thyroid Panel With TSH  6. Gastroesophageal reflux disease, esophagitis presence not specified -She has no complaints today with her reflux issues - CBC with Differential/Platelet  7. Eye irritation -Use eyedrops as directed - trimethoprim-polymyxin b (POLYTRIM) ophthalmic solution 1 drop; Place 1 drop into both eyes every 4 (four) hours.  8. Conjunctivitis of left eye -Polytrim  ophthalmic  9. COPD (chronic obstructive pulmonary disease) with chronic bronchitis ([place]) -Continue to make all efforts at smoking cessation  10. Peripheral vascular insufficiency ([place]) -Follow-up with vascular surgeon  11. Obstructive chronic bronchitis without exacerbation ([place]) -Continue bronchodilator as needed  Patient Instructions                       Medicare Annual Wellness Visit  [facility] and the medical providers at [facility] strive to bring you the best medical care.  In doing so we not only want to address your current medical conditions and concerns but also to detect new conditions early and prevent illness, disease and health-related problems.    Medicare offers a yearly Wellness Visit which allows our clinical staff to assess your need for preventative services including immunizations, lifestyle education, counseling to decrease risk of preventable diseases and screening for fall risk and other medical concerns.    This visit is provided free of charge (no copay) for all Medicare recipients. The clinical pharmacists at [facility] have begun to conduct these Wellness Visits which will also include a thorough review of all your medications.    As you primary medical provider recommend that you make an appointment for your Annual Wellness Visit if you have not done so already this year.  You may set up this appointment before you leave today or you may call back ([phone]) and schedule an appointment.  Please make sure when you call that you mention that you are scheduling your Annual Wellness Visit with the clinical pharmacist so that the appointment may be made for the proper length of time.    Continue current medications. Continue good therapeutic lifestyle changes which include good diet and exercise. Fall precautions discussed with patient. If an FOBT was given today- please return it to our front desk. If you are over  42 years old - you may need Prevnar 24 or the adult Pneumonia vaccine.   After your visit with [place] today you will receive a survey in the mail or online from [employer] regarding your care with [place]. Please take a moment to fill this out. Your feedback is very important to [place] as you can help [place] better understand your patient needs as well as improve your experience and satisfaction. WE CARE ABOUT YOU!!!   The wound on the left lateral foot appears to be healing well. There is no drainage no redness. The patient should continue with current dressings. She should make every effort at stopping smoking. She should follow-up with vascular surgeon as planned    Arrie Senate MD

## 2015-11-16 NOTE — Patient Instructions (Addendum)
Medicare Annual Wellness Visit  [facility] and the medical providers at [facility] strive to bring you the best medical care.  In doing so we not only want to address your current medical conditions and concerns but also to detect new conditions early and prevent illness, disease and health-related problems.    Medicare offers a yearly Wellness Visit which allows our clinical staff to assess your need for preventative services including immunizations, lifestyle education, counseling to decrease risk of preventable diseases and screening for fall risk and other medical concerns.    This visit is provided free of charge (no copay) for all Medicare recipients. The clinical pharmacists at [facility] have begun to conduct these Wellness Visits which will also include a thorough review of all your medications.    As you primary medical provider recommend that you make an appointment for your Annual Wellness Visit if you have not done so already this year.  You may set up this appointment before you leave today or you may call back ([phone]) and schedule an appointment.  Please make sure when you call that you mention that you are scheduling your Annual Wellness Visit with the clinical pharmacist so that the appointment may be made for the proper length of time.    Continue current medications. Continue good therapeutic lifestyle changes which include good diet and exercise. Fall precautions discussed with patient. If an FOBT was given today- please return it to our front desk. If you are over 40 years old - you may need Prevnar 39 or the adult Pneumonia vaccine.   After your visit with [place] today you will receive a survey in the mail or online from [employer] regarding your care with [place]. Please take a moment to fill this out. Your feedback is very important to [place] as you can help [place] better understand your patient needs as well as improve  your experience and satisfaction. WE CARE ABOUT YOU!!!   The wound on the left lateral foot appears to be healing well. There is no drainage no redness. The patient should continue with current dressings. She should make every effort at stopping smoking. She should follow-up with vascular surgeon as planned

## 2015-11-16 NOTE — Addendum Note (Signed)
Addended by: Zannie Cove on: 11/16/2015 12:15 PM   Modules accepted: Orders

## 2015-11-17 ENCOUNTER — Telehealth: Payer: Self-pay | Admitting: *Deleted

## 2015-11-17 LAB — BMP8+EGFR
BUN/Creatinine Ratio: 32 — ABNORMAL HIGH (ref 12–28)
BUN: 25 mg/dL (ref 8–27)
CO2: 28 mmol/L (ref 18–29)
CREATININE: 0.77 mg/dL (ref 0.57–1.00)
Calcium: 10 mg/dL (ref 8.7–10.3)
Chloride: 97 mmol/L (ref 96–106)
GFR calc Af Amer: 83 mL/min/{1.73_m2} (ref >59)
GFR, EST NON AFRICAN AMERICAN: 72 mL/min/{1.73_m2} (ref >59)
GLUCOSE: 94 mg/dL (ref 65–99)
Potassium: 3.8 mmol/L (ref 3.5–5.2)
Sodium: 142 mmol/L (ref 134–144)

## 2015-11-17 LAB — HEPATIC FUNCTION PANEL
ALBUMIN: 4.4 g/dL (ref 3.5–4.7)
ALK PHOS: 55 IU/L (ref 39–117)
ALT: 5 IU/L (ref 0–32)
AST: 15 IU/L (ref 0–40)
BILIRUBIN TOTAL: 0.3 mg/dL (ref 0.0–1.2)
BILIRUBIN, DIRECT: 0.11 mg/dL (ref 0.00–0.40)
Total Protein: 6.8 g/dL (ref 6.0–8.5)

## 2015-11-17 LAB — NMR, LIPOPROFILE
Cholesterol: 166 mg/dL (ref 100–199)
HDL CHOLESTEROL BY NMR: 44 mg/dL (ref >39)
HDL Particle Number: 29.7 umol/L — ABNORMAL LOW (ref >=30.5)
LDL PARTICLE NUMBER: 1200 nmol/L — AB (ref <1000)
LDL Size: 20.7 nm (ref >20.5)
LDL-C: 92 mg/dL (ref 0–99)
LP-IR SCORE: 36 (ref <=45)
Small LDL Particle Number: 462 nmol/L (ref <=527)
Triglycerides by NMR: 149 mg/dL (ref 0–149)

## 2015-11-17 LAB — CBC WITH DIFFERENTIAL/PLATELET
BASOS: 1 %
Basophils Absolute: 0.1 10*3/uL (ref 0.0–0.2)
EOS (ABSOLUTE): 0.3 10*3/uL (ref 0.0–0.4)
EOS: 3 %
HEMATOCRIT: 42.6 % (ref 34.0–46.6)
HEMOGLOBIN: 14.6 g/dL (ref 11.1–15.9)
IMMATURE GRANS (ABS): 0 10*3/uL (ref 0.0–0.1)
IMMATURE GRANULOCYTES: 0 %
Lymphocytes Absolute: 2.9 10*3/uL (ref 0.7–3.1)
Lymphs: 31 %
MCH: 28.5 pg (ref 26.6–33.0)
MCHC: 34.3 g/dL (ref 31.5–35.7)
MCV: 83 fL (ref 79–97)
MONOCYTES: 7 %
Monocytes Absolute: 0.6 10*3/uL (ref 0.1–0.9)
NEUTROS PCT: 58 %
Neutrophils Absolute: 5.4 10*3/uL (ref 1.4–7.0)
Platelets: 186 10*3/uL (ref 150–379)
RBC: 5.12 x10E6/uL (ref 3.77–5.28)
RDW: 14.7 % (ref 12.3–15.4)
WBC: 9.4 10*3/uL (ref 3.4–10.8)

## 2015-11-17 LAB — VITAMIN D 25 HYDROXY (VIT D DEFICIENCY, FRACTURES): Vit D, 25-Hydroxy: 58.4 ng/mL (ref 30.0–100.0)

## 2015-11-17 LAB — THYROID PANEL WITH TSH
Free Thyroxine Index: 3.3 (ref 1.2–4.9)
T3 Uptake Ratio: 29 % (ref 24–39)
T4 TOTAL: 11.5 ug/dL (ref 4.5–12.0)
TSH: 0.196 u[IU]/mL — ABNORMAL LOW (ref 0.450–4.500)

## 2015-11-17 NOTE — Telephone Encounter (Signed)
Patient is taking levothyroxine 112 whole tablet everyday except 1/2 tablet on sunday

## 2015-11-17 NOTE — Telephone Encounter (Signed)
-----   Message from Chipper Herb, MD sent at 11/17/2015 12:01 PM EDT ----- The total LDL particle number is 1200 and previously was 1416. The patient should continue with her treatment and with as aggressive therapeutic lifestyle changes as possible including diet and exercise. LDL C was also elevated but lower than in the past. Triglycerides were good at 149. The good cholesterol the HDL particle number is still in the low range but higher than its been in the past. Please call the patient with this results as well as with the results that have already been reviewed with recommendations

## 2015-11-17 NOTE — Telephone Encounter (Signed)
Reduce levothyroxin further by taking one whole pill Monday through Friday and a half on Saturday and Sunday. Repeat thyroid profile at next patient visit. Please call the patient's daughter with this report

## 2015-11-17 NOTE — Telephone Encounter (Signed)
Daughter will call us back and let us know how she is taking her levothyroxine

## 2015-11-18 NOTE — Telephone Encounter (Signed)
Spoke to pt's daughter Suanne Marker and she is aware.

## 2015-11-23 ENCOUNTER — Telehealth: Payer: Self-pay | Admitting: Family Medicine

## 2015-11-23 MED ORDER — AMOXICILLIN 500 MG PO CAPS: 500.0000 mg | ORAL_CAPSULE | Freq: Three times a day (TID) | ORAL | 0 refills | Status: DC

## 2015-11-23 NOTE — Telephone Encounter (Signed)
Patient seen [facility] 11/16/15 and was told to have Rhonda call in a week and let [facility] know how she was doing. Patient is still having blurry vision and bilateral eye pain. They do not think the eye drops are working.

## 2015-11-23 NOTE — Telephone Encounter (Signed)
Call the patient's daughter Suanne Marker with this information. Start amoxicillin 500 mg 3 times daily for 10 days

## 2015-11-23 NOTE — Telephone Encounter (Signed)
Can she have a antibiotic for sinus

## 2015-11-23 NOTE — Telephone Encounter (Signed)
Daughter aware.

## 2015-11-25 ENCOUNTER — Encounter (HOSPITAL_BASED_OUTPATIENT_CLINIC_OR_DEPARTMENT_OTHER): Payer: Medicare Other | Attending: Internal Medicine

## 2015-11-25 DIAGNOSIS — E1151 Type 2 diabetes mellitus with diabetic peripheral angiopathy without gangrene: Secondary | ICD-10-CM | POA: Insufficient documentation

## 2015-11-25 DIAGNOSIS — I509 Heart failure, unspecified: Secondary | ICD-10-CM | POA: Insufficient documentation

## 2015-11-25 DIAGNOSIS — J449 Chronic obstructive pulmonary disease, unspecified: Secondary | ICD-10-CM | POA: Insufficient documentation

## 2015-11-25 DIAGNOSIS — E11621 Type 2 diabetes mellitus with foot ulcer: Secondary | ICD-10-CM | POA: Insufficient documentation

## 2015-11-25 DIAGNOSIS — M109 Gout, unspecified: Secondary | ICD-10-CM | POA: Insufficient documentation

## 2015-11-25 DIAGNOSIS — L97522 Non-pressure chronic ulcer of other part of left foot with fat layer exposed: Secondary | ICD-10-CM | POA: Diagnosis not present

## 2015-11-25 DIAGNOSIS — L97521 Non-pressure chronic ulcer of other part of left foot limited to breakdown of skin: Secondary | ICD-10-CM | POA: Diagnosis not present

## 2015-12-02 ENCOUNTER — Ambulatory Visit (INDEPENDENT_AMBULATORY_CARE_PROVIDER_SITE_OTHER): Payer: Medicare Other | Admitting: Nurse Practitioner

## 2015-12-02 ENCOUNTER — Encounter: Payer: Self-pay | Admitting: Nurse Practitioner

## 2015-12-02 ENCOUNTER — Ambulatory Visit (INDEPENDENT_AMBULATORY_CARE_PROVIDER_SITE_OTHER): Payer: Medicare Other

## 2015-12-02 ENCOUNTER — Ambulatory Visit: Payer: Medicare Other

## 2015-12-02 VITALS — BP 126/54 | HR 60 | Temp 97.0°F

## 2015-12-02 DIAGNOSIS — R0781 Pleurodynia: Secondary | ICD-10-CM | POA: Diagnosis not present

## 2015-12-02 DIAGNOSIS — E538 Deficiency of other specified B group vitamins: Secondary | ICD-10-CM

## 2015-12-02 DIAGNOSIS — R2681 Unsteadiness on feet: Secondary | ICD-10-CM | POA: Diagnosis not present

## 2015-12-02 DIAGNOSIS — R296 Repeated falls: Secondary | ICD-10-CM | POA: Diagnosis not present

## 2015-12-02 DIAGNOSIS — S20211A Contusion of right front wall of thorax, initial encounter: Secondary | ICD-10-CM | POA: Diagnosis not present

## 2015-12-02 MED ORDER — HUGO ROLLING WALKER ELITE MISC: 0 refills | Status: AC

## 2015-12-02 NOTE — Progress Notes (Signed)
   Subjective:    [name], female    DOB: Oct 15, 1931, 82 y.o.   MRN: [id number]  HPI Patient brought in by family stating that she fell in her bedroom. She hit the  Right side of  on dresser and hit back of head on floor. DID not knock her out . Denoes headache or blurred vision. Her only complaint is right flank pain.    Review of Systems  Constitutional: Negative.   HENT: Negative.   Respiratory: Negative.   Cardiovascular: Negative.   Gastrointestinal: Negative.   Genitourinary: Negative.   Neurological: Negative.   Psychiatric/Behavioral: Negative.   All other systems reviewed and are negative.      Objective:   Physical Exam  Constitutional: She is oriented to person, place, and time. She appears well-developed and well-nourished. No distress.  Cardiovascular: Normal rate and regular rhythm.   Pulmonary/Chest: Effort normal and breath sounds normal.  Neurological: She is alert and oriented to person, place, and time.  Skin: Skin is warm.  Psychiatric: She has a normal mood and affect. Her behavior is normal. Judgment and thought content normal.   BP (!) 126/54 (BP Location: Right Arm, Patient Position: Sitting, Cuff Size: Normal)   Pulse 60   Temp 97 F (36.1 C) (Oral)   Chest xray- no rib fracture       Assessment & Plan:   1. Rib pain on right side   2. Chest wall contusion, right, initial encounter    Cough and deep breath every 2 hours- to prevent pneumonia Tylenol OTC for pain Moist heat to area  RTO prn  Mary-Margaret Hassell Done, FNP

## 2015-12-02 NOTE — Addendum Note (Signed)
Addended by: Chevis Pretty on: 12/02/2015 12:36 PM   Modules accepted: Orders

## 2015-12-02 NOTE — Patient Instructions (Signed)
Rib Contusion A rib contusion is a deep bruise on your rib area. Contusions are the result of a blunt trauma that causes bleeding and injury to the tissues under the skin. A rib contusion may involve bruising of the ribs and of the skin and muscles in the area. The skin overlying the contusion may turn blue, purple, or yellow. Minor injuries will give you a painless contusion, but more severe contusions may stay painful and swollen for a few weeks. CAUSES  A contusion is usually caused by a blow, trauma, or direct force to an area of the body. This often occurs while playing contact sports. SYMPTOMS  Swelling and redness of the injured area.  Discoloration of the injured area.  Tenderness and soreness of the injured area.  Pain with or without movement. DIAGNOSIS  The diagnosis can be made by taking a medical history and performing a physical exam. An X-ray, CT scan, or MRI may be needed to determine if there were any associated injuries, such as broken bones (fractures) or internal injuries. TREATMENT  Often, the best treatment for a rib contusion is rest. Icing or applying cold compresses to the injured area may help reduce swelling and inflammation. Deep breathing exercises may be recommended to reduce the risk of partial lung collapse and pneumonia. Over-the-counter or prescription medicines may also be recommended for pain control. HOME CARE INSTRUCTIONS   Apply ice to the injured area:  Put ice in a plastic bag.  Place a towel between your skin and the bag.  Leave the ice on for 20 minutes, 2-3 times per day.  Take medicines only as directed by your health care provider.  Rest the injured area. Avoid strenuous activity and any activities or movements that cause pain. Be careful during activities and avoid bumping the injured area.  Perform deep-breathing exercises as directed by your health care provider.  Do not lift anything that is heavier than 5 lb (2.3 kg) until your  health care provider approves.  Do not use any tobacco products, including cigarettes, chewing tobacco, or electronic cigarettes. If you need help quitting, ask your health care provider. SEEK MEDICAL CARE IF:   You have increased bruising or swelling.  You have pain that is not controlled with treatment.  You have a fever. SEEK IMMEDIATE MEDICAL CARE IF:   You have difficulty breathing or shortness of breath.  You develop a continual cough, or you cough up thick or bloody sputum.  You feel sick to your stomach (nauseous), you throw up (vomit), or you have abdominal pain.   This information is not intended to replace advice given to you by your health care provider. Make sure you discuss any questions you have with your health care provider.   Document Released: 11/28/2000 Document Revised: 03/26/2014 Document Reviewed: 12/15/2013 Elsevier Interactive Patient Education 2016 [employer]  

## 2015-12-09 DIAGNOSIS — E11621 Type 2 diabetes mellitus with foot ulcer: Secondary | ICD-10-CM | POA: Diagnosis not present

## 2015-12-09 DIAGNOSIS — L97522 Non-pressure chronic ulcer of other part of left foot with fat layer exposed: Secondary | ICD-10-CM | POA: Diagnosis not present

## 2015-12-23 ENCOUNTER — Encounter (HOSPITAL_BASED_OUTPATIENT_CLINIC_OR_DEPARTMENT_OTHER): Payer: Medicare Other | Attending: Internal Medicine

## 2015-12-23 DIAGNOSIS — L97522 Non-pressure chronic ulcer of other part of left foot with fat layer exposed: Secondary | ICD-10-CM | POA: Insufficient documentation

## 2015-12-23 DIAGNOSIS — J449 Chronic obstructive pulmonary disease, unspecified: Secondary | ICD-10-CM | POA: Insufficient documentation

## 2015-12-23 DIAGNOSIS — I509 Heart failure, unspecified: Secondary | ICD-10-CM | POA: Insufficient documentation

## 2015-12-23 DIAGNOSIS — E11621 Type 2 diabetes mellitus with foot ulcer: Secondary | ICD-10-CM | POA: Insufficient documentation

## 2015-12-23 DIAGNOSIS — M1990 Unspecified osteoarthritis, unspecified site: Secondary | ICD-10-CM | POA: Insufficient documentation

## 2015-12-23 DIAGNOSIS — L97521 Non-pressure chronic ulcer of other part of left foot limited to breakdown of skin: Secondary | ICD-10-CM | POA: Diagnosis not present

## 2016-01-02 ENCOUNTER — Ambulatory Visit (INDEPENDENT_AMBULATORY_CARE_PROVIDER_SITE_OTHER): Payer: Medicare Other | Admitting: *Deleted

## 2016-01-02 DIAGNOSIS — Z23 Encounter for immunization: Secondary | ICD-10-CM

## 2016-01-02 DIAGNOSIS — E538 Deficiency of other specified B group vitamins: Secondary | ICD-10-CM | POA: Diagnosis not present

## 2016-01-02 NOTE — Progress Notes (Signed)
Pt given Vit B12inj and influenza vaccine Pt tolerated well

## 2016-01-06 DIAGNOSIS — L97521 Non-pressure chronic ulcer of other part of left foot limited to breakdown of skin: Secondary | ICD-10-CM | POA: Diagnosis not present

## 2016-01-06 DIAGNOSIS — E11621 Type 2 diabetes mellitus with foot ulcer: Secondary | ICD-10-CM | POA: Diagnosis not present

## 2016-01-15 DIAGNOSIS — E119 Type 2 diabetes mellitus without complications: Secondary | ICD-10-CM | POA: Diagnosis not present

## 2016-01-20 ENCOUNTER — Encounter (HOSPITAL_BASED_OUTPATIENT_CLINIC_OR_DEPARTMENT_OTHER): Payer: Medicare Other | Attending: Internal Medicine

## 2016-01-20 DIAGNOSIS — J449 Chronic obstructive pulmonary disease, unspecified: Secondary | ICD-10-CM | POA: Insufficient documentation

## 2016-01-20 DIAGNOSIS — E1151 Type 2 diabetes mellitus with diabetic peripheral angiopathy without gangrene: Secondary | ICD-10-CM | POA: Insufficient documentation

## 2016-01-20 DIAGNOSIS — L97522 Non-pressure chronic ulcer of other part of left foot with fat layer exposed: Secondary | ICD-10-CM | POA: Insufficient documentation

## 2016-01-20 DIAGNOSIS — L97521 Non-pressure chronic ulcer of other part of left foot limited to breakdown of skin: Secondary | ICD-10-CM | POA: Diagnosis not present

## 2016-01-20 DIAGNOSIS — E11621 Type 2 diabetes mellitus with foot ulcer: Secondary | ICD-10-CM | POA: Insufficient documentation

## 2016-01-23 ENCOUNTER — Telehealth: Payer: Self-pay | Admitting: Family Medicine

## 2016-01-23 DIAGNOSIS — J449 Chronic obstructive pulmonary disease, unspecified: Secondary | ICD-10-CM

## 2016-01-23 NOTE — Telephone Encounter (Signed)
Pt came in requesting order for nebulizer Order printed and given to pt

## 2016-01-23 NOTE — Telephone Encounter (Signed)
done

## 2016-01-27 DIAGNOSIS — E11621 Type 2 diabetes mellitus with foot ulcer: Secondary | ICD-10-CM | POA: Diagnosis not present

## 2016-01-27 DIAGNOSIS — E1151 Type 2 diabetes mellitus with diabetic peripheral angiopathy without gangrene: Secondary | ICD-10-CM | POA: Diagnosis not present

## 2016-01-27 DIAGNOSIS — L97521 Non-pressure chronic ulcer of other part of left foot limited to breakdown of skin: Secondary | ICD-10-CM | POA: Diagnosis not present

## 2016-01-30 ENCOUNTER — Ambulatory Visit (HOSPITAL_COMMUNITY)
Admission: RE | Admit: 2016-01-30 | Discharge: 2016-01-30 | Disposition: A | Discharge status: Home / Self Care | Payer: Medicare Other | Source: Ambulatory Visit | Attending: Vascular Surgery | Admitting: Vascular Surgery

## 2016-01-30 ENCOUNTER — Ambulatory Visit (INDEPENDENT_AMBULATORY_CARE_PROVIDER_SITE_OTHER)
Admission: RE | Admit: 2016-01-30 | Discharge: 2016-01-30 | Disposition: A | Discharge status: Home / Self Care | Payer: Medicare Other | Source: Ambulatory Visit | Attending: Vascular Surgery | Admitting: Vascular Surgery

## 2016-01-30 ENCOUNTER — Ambulatory Visit: Payer: Medicare Other | Admitting: Surgery

## 2016-01-30 DIAGNOSIS — I7025 Atherosclerosis of native arteries of other extremities with ulceration: Secondary | ICD-10-CM

## 2016-01-30 DIAGNOSIS — S81802D Unspecified open wound, left lower leg, subsequent encounter: Secondary | ICD-10-CM

## 2016-02-01 ENCOUNTER — Ambulatory Visit (INDEPENDENT_AMBULATORY_CARE_PROVIDER_SITE_OTHER): Payer: Medicare Other | Admitting: Family Medicine

## 2016-02-01 ENCOUNTER — Encounter: Payer: Self-pay | Admitting: Family Medicine

## 2016-02-01 ENCOUNTER — Encounter: Payer: Self-pay | Admitting: Surgery

## 2016-02-01 VITALS — BP 125/61 | HR 69 | Temp 97.7°F | Ht 66.0 in | Wt 134.5 lb

## 2016-02-01 DIAGNOSIS — J441 Chronic obstructive pulmonary disease with (acute) exacerbation: Secondary | ICD-10-CM

## 2016-02-01 MED ORDER — PREDNISONE 20 MG PO TABS: ORAL_TABLET | ORAL | 0 refills | Status: DC

## 2016-02-01 MED ORDER — AZITHROMYCIN 250 MG PO TABS: ORAL_TABLET | ORAL | 0 refills | Status: DC

## 2016-02-01 NOTE — Progress Notes (Signed)
BP 125/61   Pulse 69   Temp 97.7 F (36.5 C) (Oral)   Ht '5\' 6"'$  (1.676 m)   Wt 134 lb 8 oz (61 kg)   BMI 21.71 kg/m    Subjective:    [name], female    DOB: 1931-12-25, 82 y.o.   MRN: [id number]  [name] is a 82 y.o. female presenting on 02/01/2016 for Cough and Bronchitis (chest congestion, burning and tightness)   HPI Cough and chest congestion Patient has been having cough and chest congestion that is been going on for the past 3 days. She denies any fevers or chills but has had some shortness of breath and wheezing. Her cough has been productive but not anymore productive then the cough that she normally has. She has been starting to feel some achiness and that's usually what she starts to feel like when she is getting a bronchitis or a COPD exacerbation. She also has been getting some tightness in her chest as well. She denies any sick contacts that she knows of. She is still smoking about a pack per day.  Relevant past medical, surgical, family and social history reviewed and updated as indicated. Interim medical history since our last visit reviewed. Allergies and medications reviewed and updated.  Review of Systems  Constitutional: Negative for chills and fever.  HENT: Positive for congestion, postnasal drip, rhinorrhea, sinus pressure, sneezing and sore throat. Negative for ear discharge and ear pain.   Eyes: Negative for pain, redness and visual disturbance.  Respiratory: Positive for cough, shortness of breath and wheezing. Negative for chest tightness.   Cardiovascular: Negative for chest pain and leg swelling.  Genitourinary: Negative for difficulty urinating and dysuria.  Musculoskeletal: Negative for back pain and gait problem.  Skin: Negative for rash.  Neurological: Negative for light-headedness and headaches.  Psychiatric/Behavioral: Negative for agitation and behavioral problems.  All other systems reviewed and are  negative.   Per HPI unless specifically indicated above     Objective:    BP 125/61   Pulse 69   Temp 97.7 F (36.5 C) (Oral)   Ht '5\' 6"'$  (1.676 m)   Wt 134 lb 8 oz (61 kg)   BMI 21.71 kg/m   Wt Readings from Last 3 Encounters:  02/01/16 134 lb 8 oz (61 kg)  11/16/15 135 lb (61.2 kg)  10/24/15 136 lb (61.7 kg)    Physical Exam  Constitutional: She is oriented to person, place, and time. She appears well-developed and well-nourished. No distress.  HENT:  Right Ear: Tympanic membrane, external ear and ear canal normal.  Left Ear: Tympanic membrane, external ear and ear canal normal.  Nose: Mucosal edema and rhinorrhea present. No epistaxis. Right sinus exhibits no maxillary sinus tenderness and no frontal sinus tenderness. Left sinus exhibits no maxillary sinus tenderness and no frontal sinus tenderness.  Mouth/Throat: Uvula is midline and mucous membranes are normal. Posterior oropharyngeal edema and posterior oropharyngeal erythema present. No oropharyngeal exudate or tonsillar abscesses.  Eyes: Conjunctivae are normal.  Cardiovascular: Normal rate, regular rhythm, normal heart sounds and intact distal pulses.   No murmur heard. Pulmonary/Chest: Effort normal. No respiratory distress. She has wheezes (End expiratory wheezing). She has no rales.  Musculoskeletal: Normal range of motion. She exhibits no edema or tenderness.  Neurological: She is alert and oriented to person, place, and time. Coordination normal.  Skin: Skin is warm and dry. No rash noted. She is not diaphoretic.  Psychiatric: She has  a normal mood and affect. Her behavior is normal.  Vitals reviewed.     Assessment & Plan:   Problem List Items Addressed This Visit    None    Visit Diagnoses    COPD exacerbation ([place])    -  Primary   Relevant Medications   azithromycin (ZITHROMAX) 250 MG tablet   predniSONE (DELTASONE) 20 MG tablet       Follow up plan: Return if symptoms worsen or fail to  improve.  Counseling provided for all of the vaccine components No orders of the defined types were placed in this encounter.   Caryl Pina, MD [facility] 02/01/2016, 2:50 PM

## 2016-02-02 DIAGNOSIS — E11621 Type 2 diabetes mellitus with foot ulcer: Secondary | ICD-10-CM | POA: Diagnosis not present

## 2016-02-02 DIAGNOSIS — L97521 Non-pressure chronic ulcer of other part of left foot limited to breakdown of skin: Secondary | ICD-10-CM | POA: Diagnosis not present

## 2016-02-03 ENCOUNTER — Ambulatory Visit (INDEPENDENT_AMBULATORY_CARE_PROVIDER_SITE_OTHER): Payer: Medicare Other | Admitting: *Deleted

## 2016-02-03 DIAGNOSIS — E538 Deficiency of other specified B group vitamins: Secondary | ICD-10-CM

## 2016-02-13 ENCOUNTER — Ambulatory Visit (INDEPENDENT_AMBULATORY_CARE_PROVIDER_SITE_OTHER): Payer: Medicare Other | Admitting: Surgery

## 2016-02-13 ENCOUNTER — Encounter: Payer: Self-pay | Admitting: Surgery

## 2016-02-13 VITALS — BP 121/68 | HR 62 | Temp 97.1°F | Resp 18 | Ht 66.0 in | Wt 137.4 lb

## 2016-02-13 DIAGNOSIS — S81802D Unspecified open wound, left lower leg, subsequent encounter: Secondary | ICD-10-CM

## 2016-02-13 NOTE — Progress Notes (Signed)
[facility] [place]  Patient name: Katie Wilkins MRN: [id number] DOB: 1931-11-21 Sex: female  REASON FOR VISIT: follow-up wound  [name] is a 82 y.o. female who presents for continued follow-up of her nonhealing wound left foot. The patient put Aspercreme, a copper sock and a heating pad on her left foot. Patient underwent angiography by Dr. 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 She also has a left SFA occlusion. At her last office visit on 10/24/2015, her wound was significantly improved. She was advised to continue going to the wound center.  She takes a baby aspirin daily.  Today, she says that her wound continues to improve. She is getting placental cells as part of a clinical trial at the wound center.  Past Medical History:  Diagnosis Date  . Abdominal bloating   . anemia iron deficiency   . Arthritis   . Bowen's disease   . Cardiomyopathy   . Cataract   . CHF (congestive heart failure) ([place])   . COPD (chronic obstructive pulmonary disease) ([place])   . Diabetes mellitus without complication ([place])   . Diarrhea   . Diverticulosis   . Heme + stool   . Hemorrhoids   . Hiatal hernia   . Hyperlipemia   . Mitral valve regurgitation   . Neoplasm    Malignant, anal canal, squamous cell  . Nodular goiter   . Osteoporosis   . Rectal bleeding   . Rectocele   . Renal cyst   . Vitamin D deficiency     Family History  Problem Relation Age of Onset  . Diabetes Mother   . Alzheimer's disease Mother   . Diabetes Brother   . Alzheimer's disease Brother   . Colon cancer Neg Hx     SOCIAL HISTORY: Social History  Substance Use Topics  . Smoking status: Current Every Day Smoker    Packs/day: 1.00    Years: 57.00    Types: Cigarettes    Start date: 03/19/1953  . Smokeless tobacco: Never Used  . Alcohol use No    Allergies  Allergen Reactions  . Ace Inhibitors Cough  .  Codeine Other (See Comments)    Sleepy and crazy    Current Outpatient Prescriptions  Medication Sig Dispense Refill  . acetaminophen (TYLENOL) 325 MG tablet Take 650 mg by mouth at bedtime.    Marland Kitchen albuterol (PROVENTIL HFA;VENTOLIN HFA) 108 (90 BASE) MCG/ACT inhaler Inhale 1 puff into the lungs every 4 (four) hours as needed for wheezing.     Marland Kitchen albuterol (PROVENTIL) (2.5 MG/3ML) 0.083% nebulizer solution USE ONE VIAL IN NEBULIZER EVERY 6 HOURS prn Dx. J44.9 (Patient taking differently: Take 2.5 mg by nebulization every 6 (six) hours as needed for wheezing or shortness of breath. ) 225 mL 11  . aspirin EC 81 MG tablet Take 81 mg by mouth daily.    . budesonide (PULMICORT) 0.5 MG/2ML nebulizer solution Take 2 mLs (0.5 mg total) by nebulization 2 (two) times daily. 120 mL 12  . Calcium-Vitamin D-Vitamin K (CALCIUM + D) 6057610814-40 MG-UNT-MCG CHEW Chew 1 tablet by mouth 2 (two) times daily.    . Cholecalciferol (VITAMIN D3) 1000 UNITS tablet Take 2,000 Units by mouth daily.     . cyanocobalamin (,VITAMIN B-12,) 1000 MCG/ML injection Inject 1,000 mcg into the muscle every 30 (thirty) days.     . ferrous sulfate 325 (65 FE) MG tablet Take 325 mg  by mouth 3 (three) times a week.     . hydrochlorothiazide (HYDRODIURIL) 25 MG tablet TAKE ONE TABLET BY MOUTH ONCE DAILY (Patient taking differently: Take 25 mg by mouth daily. ) 90 tablet 3  . levothyroxine (SYNTHROID, LEVOTHROID) 112 MCG tablet Take 112 mcg by mouth daily.    . meclizine (ANTIVERT) 32 MG tablet Take 1 tablet (32 mg total) by mouth 3 (three) times daily as needed. (Patient taking differently: Take 32 mg by mouth 3 (three) times daily as needed for dizziness. ) 30 tablet 6  . Misc. Devices (HUGO ROLLING WALKER ELITE) MISC Use daily for gait instability 1 each 0  . omega-3 acid ethyl esters (LOVAZA) 1 G capsule Take 1 g by mouth daily.     Glory Rosebush DELICA LANCETS FINE MISC Use  To check blood glucose once a day.  DX:  Type 2 DM E11.9 100 each 1   . ONETOUCH VERIO test strip USE TO CHECK GLUCOSE ONCE DAILY 100 each 0  . Pitavastatin Calcium (LIVALO) 4 MG TABS Take 4 mg by mouth daily.     Marland Kitchen azithromycin (ZITHROMAX) 250 MG tablet Take 2 the first day and then one each day after. (Patient not taking: Reported on 02/13/2016) 6 tablet 0  . predniSONE (DELTASONE) 20 MG tablet 2 po at same time daily for 5 days (Patient not taking: Reported on 02/13/2016) 10 tablet 0  . trimethoprim-polymyxin b (POLYTRIM) ophthalmic solution Place 1 drop into both eyes every 4 (four) hours. (Patient not taking: Reported on 02/13/2016) 10 mL 0   Current Facility-Administered Medications  Medication Dose Route Frequency Provider Last Rate Last Dose  . cyanocobalamin ((VITAMIN B-12)) injection 1,000 mcg  1,000 mcg Intramuscular Q30 days Chipper Herb, MD   1,000 mcg at 02/03/16 1021    REVIEW OF SYSTEMS:  '[X]'$  denotes positive finding, '[ ]'$  denotes negative finding Cardiac  Comments:  Chest pain or chest pressure:    Shortness of breath upon exertion: x   Short of breath when lying flat:    Irregular heart rhythm:        Vascular    Pain in calf, thigh, or hip brought on by ambulation:    Pain in feet at night that wakes you up from your sleep:     Blood clot in your veins:    Leg swelling:         Pulmonary    Oxygen at home:    Productive cough:  x   Wheezing:  x       Neurologic    Sudden weakness in arms or legs:     Sudden numbness in arms or legs:     Sudden onset of difficulty speaking or slurred speech:    Temporary loss of vision in one eye:     Problems with dizziness:         Gastrointestinal    Blood in stool:     Vomited blood:         Genitourinary    Burning when urinating:     Blood in urine:        Psychiatric    Major depression:         Hematologic    Bleeding problems:    Problems with blood clotting too easily:        Skin    Rashes or ulcers:        Constitutional    Fever or chills:      PHYSICAL  EXAM: Vitals:   02/13/16 1421  BP: 121/68  Pulse: 62  Resp: 18  Temp: 97.1 F (36.2 C)  TempSrc: Oral  SpO2: 96%  Weight: 137 lb 6.4 oz (62.3 kg)  Height: '5\' 6"'$  (1.676 m)    GENERAL: The patient is a well-nourished female, in no acute distress. The vital signs are documented above. VASCULAR: 2+ left femoral pulse. Nonpalpable left pedal pulse. Wound lateral left foot nearly healed. PULMONARY: Nonlabored respiratory effort. MUSCULOSKELETAL: There are no major deformities or cyanosis. NEUROLOGIC: No focal weakness or paresthesias are detected. PSYCHIATRIC: The patient has a normal affect.  DATA:  Aorta iliac duplex 01/30/2016  Left common iliac artery stent is patent. Peak systolic velocity proximal to stent 251 cm/s. Biphasic waveforms throughout.  ABIs 01/30/2016  Right: 0.99, monophasic PT, triphasic DP Left: 0.59, monophasic peroneal, biphasic PT, monophasic DP  MEDICAL ISSUES: Nonhealing wound left foot  The patient's left foot wound continues to improve. She is close to healing this wound. She is going to the wound center. Her left common iliac stent is patent. She'll follow-up in 3 months for a wound check. If her wound is not healed by that time, would have discussions regarding left fem-above-knee pop.     Virgina Jock, PA-C [facility] [place]    I agree with the above  Annamarie Major

## 2016-02-14 DIAGNOSIS — J449 Chronic obstructive pulmonary disease, unspecified: Secondary | ICD-10-CM | POA: Diagnosis not present

## 2016-02-15 DIAGNOSIS — J449 Chronic obstructive pulmonary disease, unspecified: Secondary | ICD-10-CM | POA: Diagnosis not present

## 2016-02-16 DIAGNOSIS — E1151 Type 2 diabetes mellitus with diabetic peripheral angiopathy without gangrene: Secondary | ICD-10-CM | POA: Diagnosis not present

## 2016-02-16 DIAGNOSIS — E11621 Type 2 diabetes mellitus with foot ulcer: Secondary | ICD-10-CM | POA: Diagnosis not present

## 2016-02-16 DIAGNOSIS — L97521 Non-pressure chronic ulcer of other part of left foot limited to breakdown of skin: Secondary | ICD-10-CM | POA: Diagnosis not present

## 2016-02-24 ENCOUNTER — Encounter (HOSPITAL_BASED_OUTPATIENT_CLINIC_OR_DEPARTMENT_OTHER): Payer: Medicare Other | Attending: Surgery

## 2016-02-24 DIAGNOSIS — L97522 Non-pressure chronic ulcer of other part of left foot with fat layer exposed: Secondary | ICD-10-CM | POA: Insufficient documentation

## 2016-02-24 DIAGNOSIS — E11621 Type 2 diabetes mellitus with foot ulcer: Secondary | ICD-10-CM | POA: Insufficient documentation

## 2016-02-24 DIAGNOSIS — J449 Chronic obstructive pulmonary disease, unspecified: Secondary | ICD-10-CM | POA: Insufficient documentation

## 2016-02-24 DIAGNOSIS — E1151 Type 2 diabetes mellitus with diabetic peripheral angiopathy without gangrene: Secondary | ICD-10-CM | POA: Insufficient documentation

## 2016-02-24 DIAGNOSIS — L97521 Non-pressure chronic ulcer of other part of left foot limited to breakdown of skin: Secondary | ICD-10-CM | POA: Diagnosis not present

## 2016-03-02 DIAGNOSIS — L97521 Non-pressure chronic ulcer of other part of left foot limited to breakdown of skin: Secondary | ICD-10-CM | POA: Diagnosis not present

## 2016-03-02 DIAGNOSIS — E11621 Type 2 diabetes mellitus with foot ulcer: Secondary | ICD-10-CM | POA: Diagnosis not present

## 2016-03-05 ENCOUNTER — Ambulatory Visit (INDEPENDENT_AMBULATORY_CARE_PROVIDER_SITE_OTHER): Payer: Medicare Other | Admitting: *Deleted

## 2016-03-05 DIAGNOSIS — E538 Deficiency of other specified B group vitamins: Secondary | ICD-10-CM

## 2016-03-05 NOTE — Progress Notes (Signed)
Pt given vit B12 inj Tolerated well 

## 2016-03-09 DIAGNOSIS — L97521 Non-pressure chronic ulcer of other part of left foot limited to breakdown of skin: Secondary | ICD-10-CM | POA: Diagnosis not present

## 2016-03-09 DIAGNOSIS — E11621 Type 2 diabetes mellitus with foot ulcer: Secondary | ICD-10-CM | POA: Diagnosis not present

## 2016-03-16 DIAGNOSIS — L97522 Non-pressure chronic ulcer of other part of left foot with fat layer exposed: Secondary | ICD-10-CM | POA: Diagnosis not present

## 2016-03-16 DIAGNOSIS — M79672 Pain in left foot: Secondary | ICD-10-CM | POA: Diagnosis not present

## 2016-03-16 DIAGNOSIS — E1151 Type 2 diabetes mellitus with diabetic peripheral angiopathy without gangrene: Secondary | ICD-10-CM | POA: Diagnosis not present

## 2016-03-16 DIAGNOSIS — L97521 Non-pressure chronic ulcer of other part of left foot limited to breakdown of skin: Secondary | ICD-10-CM | POA: Diagnosis not present

## 2016-03-16 DIAGNOSIS — E11621 Type 2 diabetes mellitus with foot ulcer: Secondary | ICD-10-CM | POA: Diagnosis not present

## 2016-03-20 ENCOUNTER — Encounter: Payer: Self-pay | Admitting: Family Medicine

## 2016-03-20 ENCOUNTER — Ambulatory Visit (INDEPENDENT_AMBULATORY_CARE_PROVIDER_SITE_OTHER): Payer: Medicare Other

## 2016-03-20 ENCOUNTER — Ambulatory Visit (INDEPENDENT_AMBULATORY_CARE_PROVIDER_SITE_OTHER): Payer: Medicare Other | Admitting: Family Medicine

## 2016-03-20 VITALS — BP 132/70 | HR 62 | Temp 97.0°F | Ht 66.0 in | Wt 140.0 lb

## 2016-03-20 DIAGNOSIS — J449 Chronic obstructive pulmonary disease, unspecified: Secondary | ICD-10-CM | POA: Diagnosis not present

## 2016-03-20 DIAGNOSIS — E7800 Pure hypercholesterolemia, unspecified: Secondary | ICD-10-CM

## 2016-03-20 DIAGNOSIS — K219 Gastro-esophageal reflux disease without esophagitis: Secondary | ICD-10-CM | POA: Diagnosis not present

## 2016-03-20 DIAGNOSIS — E559 Vitamin D deficiency, unspecified: Secondary | ICD-10-CM

## 2016-03-20 DIAGNOSIS — I10 Essential (primary) hypertension: Secondary | ICD-10-CM

## 2016-03-20 DIAGNOSIS — E11610 Type 2 diabetes mellitus with diabetic neuropathic arthropathy: Secondary | ICD-10-CM | POA: Diagnosis not present

## 2016-03-20 LAB — BAYER DCA HB A1C WAIVED: HB A1C (BAYER DCA - WAIVED): 5.8 % (ref <7.0)

## 2016-03-20 NOTE — Progress Notes (Signed)
Subjective:    [name], female    DOB: 1931-05-31, 83 y.o.   MRN: [id number]  HPI Pt here for follow up and management of chronic medical problems which includes diabetes, hyperlipidemia and hypothyroid. She is taking medications regularly.The patient is doing well and has no specific complaints. She is followed closely by her only daughter Suanne Marker. She is due to get a chest x-ray and lab work today. Her weight is up 3 pounds. The patient is doing well overall and comes to the visit with her daughter. She denies any chest pain shortness of breath problems with swallowing heartburn indigestion nausea vomiting diarrhea blood in the stool or black tarry bowel movements. She does take iron so her stools are darker than usual because of anemia. She is passing her water without problems. Her foot continues to heal because of the burning that she received several aunts ago.     Patient Active Problem List   Diagnosis Date Noted  . Type 2 diabetes mellitus treated without insulin ([place]) 03/11/2015  . Peripheral vascular insufficiency ([place]) 09/21/2013  . Hip pain 09/21/2013  . Osteoporosis, senile 07/15/2013  . Vitamin D deficiency   . Anemia, iron deficiency 08/22/2010  . GERD (gastroesophageal reflux disease) 08/22/2010  . Guaiac positive stools 08/22/2010  . BOWEN'S DISEASE 12/24/2008  . Hypothyroidism 12/24/2008  . HEMORRHOIDS 12/24/2008  . RECTAL BLEEDING 12/24/2008  . ARTHRITIS 12/24/2008  . NEOPLASM, MALIGNANT, ANAL CANAL, SQUAMOUS CELL 12/23/2008  . Hyperlipidemia 12/23/2008  . CARDIOMYOPATHY 12/23/2008  . CHF 12/23/2008  . COPD (chronic obstructive pulmonary disease) with chronic bronchitis ([place]) 12/23/2008  . HIATAL HERNIA 12/23/2008  . DIVERTICULOSIS, COLON 12/23/2008  . RENAL CYST 12/23/2008   Outpatient Encounter Prescriptions as of 03/20/2016  Medication Sig  . acetaminophen (TYLENOL) 325 MG tablet Take 650 mg by mouth at bedtime.  Marland Kitchen albuterol (PROVENTIL  HFA;VENTOLIN HFA) 108 (90 BASE) MCG/ACT inhaler Inhale 1 puff into the lungs every 4 (four) hours as needed for wheezing.   Marland Kitchen albuterol (PROVENTIL) (2.5 MG/3ML) 0.083% nebulizer solution USE ONE VIAL IN NEBULIZER EVERY 6 HOURS prn Dx. J44.9 (Patient taking differently: Take 2.5 mg by nebulization every 6 (six) hours as needed for wheezing or shortness of breath. )  . aspirin EC 81 MG tablet Take 81 mg by mouth daily.  . budesonide (PULMICORT) 0.5 MG/2ML nebulizer solution Take 2 mLs (0.5 mg total) by nebulization 2 (two) times daily.  . Calcium-Vitamin D-Vitamin K (CALCIUM + D) [phone] MG-UNT-MCG CHEW Chew 1 tablet by mouth 2 (two) times daily.  . Cholecalciferol (VITAMIN D3) 1000 UNITS tablet Take 2,000 Units by mouth daily.   . cyanocobalamin (,VITAMIN B-12,) 1000 MCG/ML injection Inject 1,000 mcg into the muscle every 30 (thirty) days.   Marland Kitchen doxycycline (VIBRAMYCIN) 100 MG capsule Take 100 mg by mouth 2 (two) times daily.  . ferrous sulfate 325 (65 FE) MG tablet Take 325 mg by mouth 3 (three) times a week.   . hydrochlorothiazide (HYDRODIURIL) 25 MG tablet TAKE ONE TABLET BY MOUTH ONCE DAILY (Patient taking differently: Take 25 mg by mouth daily. )  . levothyroxine (SYNTHROID, LEVOTHROID) 112 MCG tablet Take 112 mcg by mouth daily.  Marland Kitchen omega-3 acid ethyl esters (LOVAZA) 1 G capsule Take 1 g by mouth daily.   Glory Rosebush DELICA LANCETS FINE MISC Use  To check blood glucose once a day.  DX:  Type 2 DM E11.9  . ONETOUCH VERIO test strip USE TO CHECK GLUCOSE ONCE DAILY  .  Pitavastatin Calcium (LIVALO) 4 MG TABS Take 4 mg by mouth daily.   . meclizine (ANTIVERT) 32 MG tablet Take 1 tablet (32 mg total) by mouth 3 (three) times daily as needed. (Patient not taking: Reported on 03/20/2016)  . Misc. Devices (HUGO ROLLING WALKER ELITE) MISC Use daily for gait instability (Patient not taking: Reported on 03/20/2016)  . [DISCONTINUED] azithromycin (ZITHROMAX) 250 MG tablet Take 2 the first day and then one each  day after. (Patient not taking: Reported on 02/13/2016)  . [DISCONTINUED] predniSONE (DELTASONE) 20 MG tablet 2 po at same time daily for 5 days (Patient not taking: Reported on 02/13/2016)  . [DISCONTINUED] trimethoprim-polymyxin b (POLYTRIM) ophthalmic solution Place 1 drop into both eyes every 4 (four) hours. (Patient not taking: Reported on 02/13/2016)   Facility-Administered Encounter Medications as of 03/20/2016  Medication  . cyanocobalamin ((VITAMIN B-12)) injection 1,000 mcg      Review of Systems  Constitutional: Negative.   HENT: Negative.   Eyes: Negative.   Respiratory: Negative.   Cardiovascular: Negative.   Gastrointestinal: Negative.   Endocrine: Negative.   Genitourinary: Negative.   Musculoskeletal: Negative.   Skin: Negative.   Allergic/Immunologic: Negative.   Neurological: Negative.   Hematological: Negative.   Psychiatric/Behavioral: Negative.        Objective:   Physical Exam  Constitutional: She is oriented to person, place, and time. She appears well-developed and well-nourished. No distress.  HENT:  Head: Normocephalic and atraumatic.  Right Ear: External ear normal.  Left Ear: External ear normal.  Nose: Nose normal.  Mouth/Throat: Oropharynx is clear and moist.  Eyes: Conjunctivae and EOM are normal. Pupils are equal, round, and reactive to light. Right eye exhibits no discharge. Left eye exhibits no discharge. No scleral icterus.  Neck: Normal range of motion. Neck supple. No thyromegaly present.  No bruits thyromegaly or anterior cervical adenopathy  Cardiovascular: Normal rate, regular rhythm and normal heart sounds.   No murmur heard. Heart is 72/m with a regular rate and rhythm  Pulmonary/Chest: Effort normal and breath sounds normal. No respiratory distress. She has no wheezes. She has no rales.  Clear anteriorly and posteriorly  Abdominal: Soft. Bowel sounds are normal. She exhibits no mass. There is no tenderness. There is no rebound and  no guarding.  No abdominal tenderness masses or bruits or organ enlargement  Musculoskeletal: Normal range of motion. She exhibits no edema.  Lymphadenopathy:    She has no cervical adenopathy.  Neurological: She is alert and oriented to person, place, and time. She has normal reflexes. No cranial nerve deficit.  Skin: Skin is warm and dry. No rash noted.  Dry skin in general with healing wound of the left foot which was not observed today. She continues to follow-up with the wound center.  Psychiatric: She has a normal mood and affect. Her behavior is normal. Judgment and thought content normal.  Nursing note and vitals reviewed.  BP 132/70 (BP Location: Left Arm)   Pulse 62   Temp 97 F (36.1 C) (Oral)   Ht '5\' 6"'  (1.676 m)   Wt 140 lb (63.5 kg)   BMI 22.60 kg/m         Assessment & Plan:  1. Type 2 diabetes mellitus with diabetic neuropathic arthropathy, without long-term current use of insulin ([place]) -The patient will continue with current treatment pending results of lab work - Bayer DCA Hb A1c Waived - BMP8+EGFR - CBC with Differential/Platelet  2. Essential hypertension -The blood pressure is good today  and she will continue with current treatment - DG Chest 2 View; Future - BMP8+EGFR - CBC with Differential/Platelet - Hepatic function panel  3. Pure hypercholesterolemia -Continue with current treatment pending results of lab work - [employer]; Future - CBC with Differential/Platelet - Lipid panel  4. Vitamin D deficiency -Continue with current treatment - CBC with Differential/Platelet - VITAMIN D 25 Hydroxy (Vit-D Deficiency, Fractures)  5. Gastroesophageal reflux disease, esophagitis presence not specified -No complaints with reflux today. - CBC with Differential/Platelet - Hepatic function panel  6. Chronic obstructive pulmonary disease, unspecified COPD type ([place]) -Breathing appears to be stable with no recent infections and no congestion or  cough - CBC with Differential/Platelet -Chest x-ray is pending  Patient Instructions                       Medicare Annual Wellness Visit  [facility] and the medical providers at [facility] strive to bring you the best medical care.  In doing so we not only want to address your current medical conditions and concerns but also to detect new conditions early and prevent illness, disease and health-related problems.    Medicare offers a yearly Wellness Visit which allows our clinical staff to assess your need for preventative services including immunizations, lifestyle education, counseling to decrease risk of preventable diseases and screening for fall risk and other medical concerns.    This visit is provided free of charge (no copay) for all Medicare recipients. The clinical pharmacists at [facility] have begun to conduct these Wellness Visits which will also include a thorough review of all your medications.    As you primary medical provider recommend that you make an appointment for your Annual Wellness Visit if you have not done so already this year.  You may set up this appointment before you leave today or you may call back ([phone]) and schedule an appointment.  Please make sure when you call that you mention that you are scheduling your Annual Wellness Visit with the clinical pharmacist so that the appointment may be made for the proper length of time.     Continue current medications. Continue good therapeutic lifestyle changes which include good diet and exercise. Fall precautions discussed with patient. If an FOBT was given today- please return it to our front desk. If you are over 53 years old - you may need Prevnar 76 or the adult Pneumonia vaccine.  **Flu shots are available--- please call and schedule a FLU-CLINIC appointment**  After your visit with [place] today you will receive a survey in the mail or online from [employer]  regarding your care with [place]. Please take a moment to fill this out. Your feedback is very important to [place] as you can help [place] better understand your patient needs as well as improve your experience and satisfaction. WE CARE ABOUT YOU!!!   Drink plenty of fluids and stay is active as possible Use good hand hygiene because of the impending flu epidemic Keep the house as cool as possible  Arrie Senate MD

## 2016-03-20 NOTE — Patient Instructions (Addendum)
Medicare Annual Wellness Visit  [facility] and the medical providers at [facility] strive to bring you the best medical care.  In doing so we not only want to address your current medical conditions and concerns but also to detect new conditions early and prevent illness, disease and health-related problems.    Medicare offers a yearly Wellness Visit which allows our clinical staff to assess your need for preventative services including immunizations, lifestyle education, counseling to decrease risk of preventable diseases and screening for fall risk and other medical concerns.    This visit is provided free of charge (no copay) for all Medicare recipients. The clinical pharmacists at [facility] have begun to conduct these Wellness Visits which will also include a thorough review of all your medications.    As you primary medical provider recommend that you make an appointment for your Annual Wellness Visit if you have not done so already this year.  You may set up this appointment before you leave today or you may call back ([phone]) and schedule an appointment.  Please make sure when you call that you mention that you are scheduling your Annual Wellness Visit with the clinical pharmacist so that the appointment may be made for the proper length of time.     Continue current medications. Continue good therapeutic lifestyle changes which include good diet and exercise. Fall precautions discussed with patient. If an FOBT was given today- please return it to our front desk. If you are over 22 years old - you may need Prevnar 56 or the adult Pneumonia vaccine.  **Flu shots are available--- please call and schedule a FLU-CLINIC appointment**  After your visit with [place] today you will receive a survey in the mail or online from [employer] regarding your care with [place]. Please take a moment to fill this out. Your feedback is very  important to [place] as you can help [place] better understand your patient needs as well as improve your experience and satisfaction. WE CARE ABOUT YOU!!!   Drink plenty of fluids and stay is active as possible Use good hand hygiene because of the impending flu epidemic Keep the house as cool as possible

## 2016-03-21 LAB — BMP8+EGFR
BUN/Creatinine Ratio: 28 (ref 12–28)
BUN: 24 mg/dL (ref 8–27)
CALCIUM: 10 mg/dL (ref 8.7–10.3)
CO2: 30 mmol/L — AB (ref 18–29)
CREATININE: 0.85 mg/dL (ref 0.57–1.00)
Chloride: 99 mmol/L (ref 96–106)
GFR calc Af Amer: 74 mL/min/{1.73_m2} (ref >59)
GFR calc non Af Amer: 64 mL/min/{1.73_m2} (ref >59)
GLUCOSE: 92 mg/dL (ref 65–99)
POTASSIUM: 3.8 mmol/L (ref 3.5–5.2)
SODIUM: 145 mmol/L — AB (ref 134–144)

## 2016-03-21 LAB — CBC WITH DIFFERENTIAL/PLATELET
BASOS ABS: 0.1 10*3/uL (ref 0.0–0.2)
Basos: 1 %
EOS (ABSOLUTE): 0.3 10*3/uL (ref 0.0–0.4)
Eos: 3 %
HEMOGLOBIN: 14.2 g/dL (ref 11.1–15.9)
Hematocrit: 42.8 % (ref 34.0–46.6)
Immature Grans (Abs): 0 10*3/uL (ref 0.0–0.1)
Immature Granulocytes: 0 %
LYMPHS ABS: 2.9 10*3/uL (ref 0.7–3.1)
Lymphs: 29 %
MCH: 27.8 pg (ref 26.6–33.0)
MCHC: 33.2 g/dL (ref 31.5–35.7)
MCV: 84 fL (ref 79–97)
MONOS ABS: 0.6 10*3/uL (ref 0.1–0.9)
Monocytes: 6 %
NEUTROS ABS: 6.1 10*3/uL (ref 1.4–7.0)
Neutrophils: 61 %
Platelets: 179 10*3/uL (ref 150–379)
RBC: 5.1 x10E6/uL (ref 3.77–5.28)
RDW: 15.3 % (ref 12.3–15.4)
WBC: 9.9 10*3/uL (ref 3.4–10.8)

## 2016-03-21 LAB — HEPATIC FUNCTION PANEL
ALK PHOS: 51 IU/L (ref 39–117)
ALT: 9 IU/L (ref 0–32)
AST: 14 IU/L (ref 0–40)
Albumin: 4.3 g/dL (ref 3.5–4.7)
Bilirubin Total: 0.4 mg/dL (ref 0.0–1.2)
Bilirubin, Direct: 0.14 mg/dL (ref 0.00–0.40)
Total Protein: 6.8 g/dL (ref 6.0–8.5)

## 2016-03-21 LAB — LIPID PANEL
CHOLESTEROL TOTAL: 181 mg/dL (ref 100–199)
Chol/HDL Ratio: 3.1 ratio units (ref 0.0–4.4)
HDL: 59 mg/dL (ref >39)
LDL Calculated: 98 mg/dL (ref 0–99)
Triglycerides: 122 mg/dL (ref 0–149)
VLDL CHOLESTEROL CAL: 24 mg/dL (ref 5–40)

## 2016-03-21 LAB — VITAMIN D 25 HYDROXY (VIT D DEFICIENCY, FRACTURES): VIT D 25 HYDROXY: 57.7 ng/mL (ref 30.0–100.0)

## 2016-03-23 ENCOUNTER — Encounter (HOSPITAL_BASED_OUTPATIENT_CLINIC_OR_DEPARTMENT_OTHER): Payer: Medicare Other | Attending: Internal Medicine

## 2016-03-23 DIAGNOSIS — F17200 Nicotine dependence, unspecified, uncomplicated: Secondary | ICD-10-CM | POA: Insufficient documentation

## 2016-03-23 DIAGNOSIS — L97521 Non-pressure chronic ulcer of other part of left foot limited to breakdown of skin: Secondary | ICD-10-CM | POA: Diagnosis not present

## 2016-03-23 DIAGNOSIS — J449 Chronic obstructive pulmonary disease, unspecified: Secondary | ICD-10-CM | POA: Insufficient documentation

## 2016-03-23 DIAGNOSIS — L97522 Non-pressure chronic ulcer of other part of left foot with fat layer exposed: Secondary | ICD-10-CM | POA: Insufficient documentation

## 2016-03-23 DIAGNOSIS — E11621 Type 2 diabetes mellitus with foot ulcer: Secondary | ICD-10-CM | POA: Diagnosis not present

## 2016-03-28 DIAGNOSIS — E11621 Type 2 diabetes mellitus with foot ulcer: Secondary | ICD-10-CM | POA: Diagnosis not present

## 2016-03-28 DIAGNOSIS — L97522 Non-pressure chronic ulcer of other part of left foot with fat layer exposed: Secondary | ICD-10-CM | POA: Diagnosis not present

## 2016-03-28 DIAGNOSIS — F17200 Nicotine dependence, unspecified, uncomplicated: Secondary | ICD-10-CM | POA: Diagnosis not present

## 2016-03-28 DIAGNOSIS — J449 Chronic obstructive pulmonary disease, unspecified: Secondary | ICD-10-CM | POA: Diagnosis not present

## 2016-03-30 DIAGNOSIS — L97522 Non-pressure chronic ulcer of other part of left foot with fat layer exposed: Secondary | ICD-10-CM | POA: Diagnosis not present

## 2016-03-30 DIAGNOSIS — E11621 Type 2 diabetes mellitus with foot ulcer: Secondary | ICD-10-CM | POA: Diagnosis not present

## 2016-04-07 DIAGNOSIS — E11621 Type 2 diabetes mellitus with foot ulcer: Secondary | ICD-10-CM | POA: Diagnosis not present

## 2016-04-07 DIAGNOSIS — L97521 Non-pressure chronic ulcer of other part of left foot limited to breakdown of skin: Secondary | ICD-10-CM | POA: Diagnosis not present

## 2016-04-09 ENCOUNTER — Ambulatory Visit (INDEPENDENT_AMBULATORY_CARE_PROVIDER_SITE_OTHER): Payer: Medicare Other | Admitting: *Deleted

## 2016-04-09 DIAGNOSIS — E538 Deficiency of other specified B group vitamins: Secondary | ICD-10-CM | POA: Diagnosis not present

## 2016-04-09 NOTE — Progress Notes (Signed)
Patient given Vitamin b12 injection and tolerated well.

## 2016-04-09 NOTE — Patient Instructions (Signed)
Cyanocobalamin, Vitamin B12 injection What is this medicine? CYANOCOBALAMIN (sye an oh koe BAL a min) is a man made form of vitamin B12. Vitamin B12 is used in the growth of healthy blood cells, nerve cells, and proteins in the body. It also helps with the metabolism of fats and carbohydrates. This medicine is used to treat people who can not absorb vitamin B12. This medicine may be used for other purposes; ask your health care provider or pharmacist if you have questions. COMMON BRAND NAME(S): B-12 Compliance Kit, B-12 Injection Kit, Cyomin, LA-12, Nutri-Twelve, Physicians EZ Use B-12, Primabalt What should I tell my health care provider before I take this medicine? They need to know if you have any of these conditions: -kidney disease -Leber's disease -megaloblastic anemia -an unusual or allergic reaction to cyanocobalamin, cobalt, other medicines, foods, dyes, or preservatives -pregnant or trying to get pregnant -breast-feeding How should I use this medicine? This medicine is injected into a muscle or deeply under the skin. It is usually given by a health care professional in a clinic or doctor's office. However, your doctor may teach you how to inject yourself. Follow all instructions. Talk to your pediatrician regarding the use of this medicine in children. Special care may be needed. Overdosage: If you think you have taken too much of this medicine contact a poison control center or emergency room at once. NOTE: This medicine is only for you. Do not share this medicine with others. What if I miss a dose? If you are given your dose at a clinic or doctor's office, call to reschedule your appointment. If you give your own injections and you miss a dose, take it as soon as you can. If it is almost time for your next dose, take only that dose. Do not take double or extra doses. What may interact with this medicine? -colchicine -heavy alcohol intake This list may not describe all possible  interactions. Give your health care provider a list of all the medicines, herbs, non-prescription drugs, or dietary supplements you use. Also tell them if you smoke, drink alcohol, or use illegal drugs. Some items may interact with your medicine. What should I watch for while using this medicine? Visit your doctor or health care professional regularly. You may need blood work done while you are taking this medicine. You may need to follow a special diet. Talk to your doctor. Limit your alcohol intake and avoid smoking to get the best benefit. What side effects may I notice from receiving this medicine? Side effects that you should report to your doctor or health care professional as soon as possible: -allergic reactions like skin rash, itching or hives, swelling of the face, lips, or tongue -blue tint to skin -chest tightness, pain -difficulty breathing, wheezing -dizziness -red, swollen painful area on the leg Side effects that usually do not require medical attention (report to your doctor or health care professional if they continue or are bothersome): -diarrhea -headache This list may not describe all possible side effects. Call your doctor for medical advice about side effects. You may report side effects to FDA at 1-800-FDA-1088. Where should I keep my medicine? Keep out of the reach of children. Store at room temperature between 15 and 30 degrees C (59 and 85 degrees F). Protect from light. Throw away any unused medicine after the expiration date. NOTE: This sheet is a summary. It may not cover all possible information. If you have questions about this medicine, talk to your doctor, pharmacist, or   health care provider.  2017 Elsevier/Gold Standard (2007-06-16 22:10:20)  

## 2016-04-12 ENCOUNTER — Other Ambulatory Visit: Payer: Self-pay | Admitting: Family Medicine

## 2016-04-13 DIAGNOSIS — T25222A Burn of second degree of left foot, initial encounter: Secondary | ICD-10-CM | POA: Diagnosis not present

## 2016-04-13 DIAGNOSIS — E11621 Type 2 diabetes mellitus with foot ulcer: Secondary | ICD-10-CM | POA: Diagnosis not present

## 2016-04-18 ENCOUNTER — Encounter: Payer: Medicare Other | Admitting: *Deleted

## 2016-04-18 DIAGNOSIS — Z1231 Encounter for screening mammogram for malignant neoplasm of breast: Secondary | ICD-10-CM | POA: Diagnosis not present

## 2016-04-20 ENCOUNTER — Encounter (HOSPITAL_BASED_OUTPATIENT_CLINIC_OR_DEPARTMENT_OTHER): Payer: Medicare Other | Attending: Internal Medicine

## 2016-04-20 DIAGNOSIS — L97522 Non-pressure chronic ulcer of other part of left foot with fat layer exposed: Secondary | ICD-10-CM | POA: Diagnosis not present

## 2016-04-20 DIAGNOSIS — E11621 Type 2 diabetes mellitus with foot ulcer: Secondary | ICD-10-CM | POA: Diagnosis not present

## 2016-04-20 DIAGNOSIS — J449 Chronic obstructive pulmonary disease, unspecified: Secondary | ICD-10-CM | POA: Insufficient documentation

## 2016-04-20 DIAGNOSIS — T25122A Burn of first degree of left foot, initial encounter: Secondary | ICD-10-CM | POA: Diagnosis not present

## 2016-04-26 DIAGNOSIS — H2513 Age-related nuclear cataract, bilateral: Secondary | ICD-10-CM | POA: Diagnosis not present

## 2016-04-26 DIAGNOSIS — H40033 Anatomical narrow angle, bilateral: Secondary | ICD-10-CM | POA: Diagnosis not present

## 2016-04-27 DIAGNOSIS — E11621 Type 2 diabetes mellitus with foot ulcer: Secondary | ICD-10-CM | POA: Diagnosis not present

## 2016-04-27 DIAGNOSIS — L97522 Non-pressure chronic ulcer of other part of left foot with fat layer exposed: Secondary | ICD-10-CM | POA: Diagnosis not present

## 2016-04-27 DIAGNOSIS — J449 Chronic obstructive pulmonary disease, unspecified: Secondary | ICD-10-CM | POA: Diagnosis not present

## 2016-04-27 DIAGNOSIS — T25222A Burn of second degree of left foot, initial encounter: Secondary | ICD-10-CM | POA: Diagnosis not present

## 2016-05-04 DIAGNOSIS — J449 Chronic obstructive pulmonary disease, unspecified: Secondary | ICD-10-CM | POA: Diagnosis not present

## 2016-05-04 DIAGNOSIS — T25022A Burn of unspecified degree of left foot, initial encounter: Secondary | ICD-10-CM | POA: Diagnosis not present

## 2016-05-04 DIAGNOSIS — E11621 Type 2 diabetes mellitus with foot ulcer: Secondary | ICD-10-CM | POA: Diagnosis not present

## 2016-05-04 DIAGNOSIS — L97522 Non-pressure chronic ulcer of other part of left foot with fat layer exposed: Secondary | ICD-10-CM | POA: Diagnosis not present

## 2016-05-11 ENCOUNTER — Ambulatory Visit (INDEPENDENT_AMBULATORY_CARE_PROVIDER_SITE_OTHER): Payer: Medicare Other | Admitting: *Deleted

## 2016-05-11 DIAGNOSIS — E538 Deficiency of other specified B group vitamins: Secondary | ICD-10-CM | POA: Diagnosis not present

## 2016-05-11 NOTE — Progress Notes (Signed)
Pt given Vit B12 inj Tolerated well 

## 2016-05-15 ENCOUNTER — Encounter: Payer: Self-pay | Admitting: Surgery

## 2016-05-18 ENCOUNTER — Encounter (HOSPITAL_BASED_OUTPATIENT_CLINIC_OR_DEPARTMENT_OTHER): Payer: Medicare Other | Attending: Internal Medicine

## 2016-05-18 DIAGNOSIS — J449 Chronic obstructive pulmonary disease, unspecified: Secondary | ICD-10-CM | POA: Diagnosis not present

## 2016-05-18 DIAGNOSIS — F17210 Nicotine dependence, cigarettes, uncomplicated: Secondary | ICD-10-CM | POA: Diagnosis not present

## 2016-05-18 DIAGNOSIS — E11621 Type 2 diabetes mellitus with foot ulcer: Secondary | ICD-10-CM | POA: Insufficient documentation

## 2016-05-18 DIAGNOSIS — L97522 Non-pressure chronic ulcer of other part of left foot with fat layer exposed: Secondary | ICD-10-CM | POA: Insufficient documentation

## 2016-05-18 DIAGNOSIS — T25022A Burn of unspecified degree of left foot, initial encounter: Secondary | ICD-10-CM | POA: Diagnosis not present

## 2016-05-19 DIAGNOSIS — G44219 Episodic tension-type headache, not intractable: Secondary | ICD-10-CM | POA: Diagnosis not present

## 2016-05-21 ENCOUNTER — Ambulatory Visit (INDEPENDENT_AMBULATORY_CARE_PROVIDER_SITE_OTHER): Payer: Medicare Other | Admitting: Surgery

## 2016-05-21 ENCOUNTER — Encounter: Payer: Self-pay | Admitting: Surgery

## 2016-05-21 VITALS — BP 133/68 | HR 76 | Temp 97.4°F | Resp 20 | Ht 66.0 in | Wt 143.0 lb

## 2016-05-21 DIAGNOSIS — I745 Embolism and thrombosis of iliac artery: Secondary | ICD-10-CM

## 2016-05-21 DIAGNOSIS — S81802D Unspecified open wound, left lower leg, subsequent encounter: Secondary | ICD-10-CM

## 2016-05-21 DIAGNOSIS — I7025 Atherosclerosis of native arteries of other extremities with ulceration: Secondary | ICD-10-CM | POA: Diagnosis not present

## 2016-05-21 DIAGNOSIS — S81809A Unspecified open wound, unspecified lower leg, initial encounter: Secondary | ICD-10-CM | POA: Insufficient documentation

## 2016-05-21 NOTE — Progress Notes (Signed)
[facility]  Patient name: Katie Wilkins MRN: [id number] DOB: 1931/06/01 Sex: female   REASON FOR VISIT:    Follow up ulcer  HISOTRY OF PRESENT ILLNESS:   Katie Wilkins is a 83 y.o. female who presents for continued follow-up of her nonhealing wound, left foot. The patient put Aspercreme, a copper sock and a heating pad on her left foot. She developed a severe wound her left foot.  During her workup she was found to have arterial insufficiency.  The Patient underwent angiography by Dr. 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 She also has a left SFA occlusion and her wound has been improving and therefore no further intervention on her left superficial femoral artery has occurred, because she has a flush occlusion, she will likely need a femoral to above-knee popliteal artery bypass graft.  She is being treated at the wound center.  She now only has a small 2 mm deep wound that is getting treated with collagen.  She does not have any pain.   PAST MEDICAL HISTORY:   Past Medical History:  Diagnosis Date  . Abdominal bloating   . anemia iron deficiency   . Arthritis   . Bowen's disease   . Cardiomyopathy   . Cataract   . CHF (congestive heart failure) ([place])   . COPD (chronic obstructive pulmonary disease) ([place])   . Diabetes mellitus without complication ([place])   . Diarrhea   . Diverticulosis   . Heme + stool   . Hemorrhoids   . Hiatal hernia   . Hyperlipemia   . Mitral valve regurgitation   . Neoplasm    Malignant, anal canal, squamous cell  . Nodular goiter   . Osteoporosis   . Rectal bleeding   . Rectocele   . Renal cyst   . Vitamin D deficiency      FAMILY HISTORY:   Family History  Problem Relation Age of Onset  . Diabetes Mother   . Alzheimer's disease Mother   . Diabetes Brother   . Alzheimer's disease Brother   . Colon cancer  Neg Hx     SOCIAL HISTORY:   Social History  Substance Use Topics  . Smoking status: Current Every Day Smoker    Packs/day: 1.00    Years: 57.00    Types: Cigarettes    Start date: 03/19/1953  . Smokeless tobacco: Never Used  . Alcohol use No     ALLERGIES:   Allergies  Allergen Reactions  . Ace Inhibitors Cough  . Codeine Other (See Comments)    Sleepy and crazy     CURRENT MEDICATIONS:   Current Outpatient Prescriptions  Medication Sig Dispense Refill  . acetaminophen (TYLENOL) 325 MG tablet Take 650 mg by mouth at bedtime.    Marland Kitchen albuterol (PROVENTIL HFA;VENTOLIN HFA) 108 (90 BASE) MCG/ACT inhaler Inhale 1 puff into the lungs every 4 (four) hours as needed for wheezing.     Marland Kitchen albuterol (PROVENTIL) (2.5 MG/3ML) 0.083% nebulizer solution USE ONE VIAL IN NEBULIZER EVERY 6 HOURS prn Dx. J44.9 (Patient taking differently: Take 2.5 mg by nebulization every 6 (six) hours as needed for wheezing or shortness of breath. ) 225 mL 11  . aspirin EC 81 MG tablet Take 81 mg by mouth daily.    . budesonide (PULMICORT) 0.5 MG/2ML nebulizer solution Take 2 mLs (0.5 mg total) by nebulization 2 (two) times daily. 120 mL 12  . Calcium-Vitamin  D-Vitamin K (CALCIUM + D) [phone] MG-UNT-MCG CHEW Chew 1 tablet by mouth 2 (two) times daily.    . Cholecalciferol (VITAMIN D3) 1000 UNITS tablet Take 2,000 Units by mouth daily.     . cyanocobalamin (,VITAMIN B-12,) 1000 MCG/ML injection Inject 1,000 mcg into the muscle every 30 (thirty) days.     . ferrous sulfate 325 (65 FE) MG tablet Take 325 mg by mouth 3 (three) times a week.     . hydrochlorothiazide (HYDRODIURIL) 25 MG tablet TAKE ONE TABLET BY MOUTH ONCE DAILY 90 tablet 1  . levothyroxine (SYNTHROID, LEVOTHROID) 112 MCG tablet Take 112 mcg by mouth daily.    . meclizine (ANTIVERT) 32 MG tablet Take 1 tablet (32 mg total) by mouth 3 (three) times daily as needed. 30 tablet 6  . Misc. Devices (HUGO ROLLING WALKER ELITE) MISC Use daily for gait  instability 1 each 0  . omega-3 acid ethyl esters (LOVAZA) 1 G capsule Take 1 g by mouth daily.     Glory Rosebush DELICA LANCETS FINE MISC Use  To check blood glucose once a day.  DX:  Type 2 DM E11.9 100 each 1  . ONETOUCH VERIO test strip USE TO CHECK GLUCOSE ONCE DAILY 100 each 0  . Pitavastatin Calcium (LIVALO) 4 MG TABS Take 4 mg by mouth daily.     Marland Kitchen doxycycline (VIBRAMYCIN) 100 MG capsule Take 100 mg by mouth 2 (two) times daily.     Current Facility-Administered Medications  Medication Dose Route Frequency Provider Last Rate Last Dose  . cyanocobalamin ((VITAMIN B-12)) injection 1,000 mcg  1,000 mcg Intramuscular Q30 days Chipper Herb, MD   1,000 mcg at 05/11/16 1005    REVIEW OF SYSTEMS:   '[X]'$  denotes positive finding, '[ ]'$  denotes negative finding Cardiac  Comments:  Chest pain or chest pressure:    Shortness of breath upon exertion:    Short of breath when lying flat:    Irregular heart rhythm:        Vascular    Pain in calf, thigh, or hip brought on by ambulation:    Pain in feet at night that wakes you up from your sleep:     Blood clot in your veins:    Leg swelling:         Pulmonary    Oxygen at home:    Productive cough:     Wheezing:         Neurologic    Sudden weakness in arms or legs:     Sudden numbness in arms or legs:     Sudden onset of difficulty speaking or slurred speech:    Temporary loss of vision in one eye:     Problems with dizziness:         Gastrointestinal    Blood in stool:     Vomited blood:         Genitourinary    Burning when urinating:     Blood in urine:        Psychiatric    Major depression:         Hematologic    Bleeding problems:    Problems with blood clotting too easily:        Skin    Rashes or ulcers: x       Constitutional    Fever or chills:      PHYSICAL EXAM:   Vitals:   05/21/16 1218  BP: 133/68  Pulse: 76  Resp: 20  Temp: 97.4 F (36.3 C)  TempSrc: Oral  SpO2: 99%  Weight: 143 lb (64.9 kg)   Height: '5\' 6"'$  (1.676 m)    GENERAL: The patient is a well-nourished female, in no acute distress. The vital signs are documented above. CARDIAC: There is a regular rate and rhythm.  PULMONARY: Non-labored respirations MUSCULOSKELETAL: There are no major deformities or cyanosis. NEUROLOGIC: No focal weakness or paresthesias are detected. SKIN: Skin on the lateral side of the left foot which the patient states I am not allowed to remove because of the underlying collagen application. PSYCHIATRIC: The patient has a normal affect.  STUDIES:   None  MEDICAL ISSUES:   Left foot wound: According to the patient this has dramatically improved there is only one small area approximately 2 mm x 1 mm that has yet to heal.  This area is getting treated with collagen at the wound center.  I told the patient and her daughter that we will continue to manage her nonoperatively regarding her left superficial femoral artery occlusion.  I am having her scheduled to follow-up with me in 3 months.  At that time we will get a left aortoiliac duplex and ABIs.  If the patient's wound starts to regress, we will need to consider the femoral-popliteal bypass grafting.    Annamarie Major, MD [facility] [phone] Pager [phone]

## 2016-05-23 DIAGNOSIS — H578 Other specified disorders of eye and adnexa: Secondary | ICD-10-CM | POA: Diagnosis not present

## 2016-05-25 DIAGNOSIS — E11621 Type 2 diabetes mellitus with foot ulcer: Secondary | ICD-10-CM | POA: Diagnosis not present

## 2016-05-25 DIAGNOSIS — T25022A Burn of unspecified degree of left foot, initial encounter: Secondary | ICD-10-CM | POA: Diagnosis not present

## 2016-05-28 NOTE — Addendum Note (Signed)
Addended by: Lianne Cure A on: 05/28/2016 09:33 AM   Modules accepted: Orders

## 2016-05-29 DIAGNOSIS — H04123 Dry eye syndrome of bilateral lacrimal glands: Secondary | ICD-10-CM | POA: Diagnosis not present

## 2016-05-31 ENCOUNTER — Other Ambulatory Visit: Payer: Self-pay | Admitting: Family Medicine

## 2016-05-31 DIAGNOSIS — J440 Chronic obstructive pulmonary disease with acute lower respiratory infection: Secondary | ICD-10-CM

## 2016-05-31 DIAGNOSIS — J209 Acute bronchitis, unspecified: Secondary | ICD-10-CM

## 2016-05-31 DIAGNOSIS — J449 Chronic obstructive pulmonary disease, unspecified: Secondary | ICD-10-CM | POA: Diagnosis not present

## 2016-06-01 DIAGNOSIS — T25222A Burn of second degree of left foot, initial encounter: Secondary | ICD-10-CM | POA: Diagnosis not present

## 2016-06-01 DIAGNOSIS — E11621 Type 2 diabetes mellitus with foot ulcer: Secondary | ICD-10-CM | POA: Diagnosis not present

## 2016-06-04 DIAGNOSIS — H04123 Dry eye syndrome of bilateral lacrimal glands: Secondary | ICD-10-CM | POA: Diagnosis not present

## 2016-06-11 ENCOUNTER — Ambulatory Visit: Payer: Medicare Other

## 2016-06-12 ENCOUNTER — Ambulatory Visit (INDEPENDENT_AMBULATORY_CARE_PROVIDER_SITE_OTHER): Payer: Medicare Other | Admitting: *Deleted

## 2016-06-12 DIAGNOSIS — E538 Deficiency of other specified B group vitamins: Secondary | ICD-10-CM | POA: Diagnosis not present

## 2016-06-12 NOTE — Progress Notes (Signed)
Pt given Vit B12 inj Tolerated well 

## 2016-06-15 DIAGNOSIS — L97522 Non-pressure chronic ulcer of other part of left foot with fat layer exposed: Secondary | ICD-10-CM | POA: Diagnosis not present

## 2016-06-15 DIAGNOSIS — E11621 Type 2 diabetes mellitus with foot ulcer: Secondary | ICD-10-CM | POA: Diagnosis not present

## 2016-06-22 ENCOUNTER — Other Ambulatory Visit: Payer: Self-pay | Admitting: Family Medicine

## 2016-06-26 ENCOUNTER — Other Ambulatory Visit: Payer: Self-pay

## 2016-06-26 MED ORDER — GLUCOSE BLOOD VI STRP: ORAL_STRIP | 9 refills | Status: DC

## 2016-06-29 ENCOUNTER — Encounter (HOSPITAL_BASED_OUTPATIENT_CLINIC_OR_DEPARTMENT_OTHER): Payer: Medicare Other | Attending: Internal Medicine

## 2016-06-29 DIAGNOSIS — E119 Type 2 diabetes mellitus without complications: Secondary | ICD-10-CM | POA: Insufficient documentation

## 2016-06-29 DIAGNOSIS — E11621 Type 2 diabetes mellitus with foot ulcer: Secondary | ICD-10-CM | POA: Diagnosis present

## 2016-06-29 DIAGNOSIS — Z09 Encounter for follow-up examination after completed treatment for conditions other than malignant neoplasm: Secondary | ICD-10-CM | POA: Insufficient documentation

## 2016-06-29 DIAGNOSIS — L97529 Non-pressure chronic ulcer of other part of left foot with unspecified severity: Secondary | ICD-10-CM | POA: Diagnosis not present

## 2016-06-29 DIAGNOSIS — F17210 Nicotine dependence, cigarettes, uncomplicated: Secondary | ICD-10-CM | POA: Diagnosis not present

## 2016-06-29 DIAGNOSIS — Z8631 Personal history of diabetic foot ulcer: Secondary | ICD-10-CM | POA: Insufficient documentation

## 2016-06-29 DIAGNOSIS — J449 Chronic obstructive pulmonary disease, unspecified: Secondary | ICD-10-CM | POA: Diagnosis not present

## 2016-07-09 DIAGNOSIS — H1013 Acute atopic conjunctivitis, bilateral: Secondary | ICD-10-CM | POA: Diagnosis not present

## 2016-07-16 ENCOUNTER — Ambulatory Visit (INDEPENDENT_AMBULATORY_CARE_PROVIDER_SITE_OTHER): Payer: Medicare Other | Admitting: *Deleted

## 2016-07-16 DIAGNOSIS — E538 Deficiency of other specified B group vitamins: Secondary | ICD-10-CM | POA: Diagnosis not present

## 2016-07-16 NOTE — Progress Notes (Signed)
Pt given Vit B12 inj Tolerated well 

## 2016-07-17 ENCOUNTER — Encounter: Payer: Medicare Other | Admitting: Family Medicine

## 2016-07-18 ENCOUNTER — Ambulatory Visit (INDEPENDENT_AMBULATORY_CARE_PROVIDER_SITE_OTHER): Payer: Medicare Other | Admitting: Family Medicine

## 2016-07-18 ENCOUNTER — Encounter: Payer: Self-pay | Admitting: Family Medicine

## 2016-07-18 VITALS — BP 134/65 | HR 63 | Temp 97.7°F | Ht 66.0 in | Wt 149.0 lb

## 2016-07-18 DIAGNOSIS — S91302A Unspecified open wound, left foot, initial encounter: Secondary | ICD-10-CM | POA: Diagnosis not present

## 2016-07-18 MED ORDER — DOXYCYCLINE HYCLATE 100 MG PO CAPS: 100.0000 mg | ORAL_CAPSULE | Freq: Two times a day (BID) | ORAL | 0 refills | Status: DC
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
## 2016-07-20 DIAGNOSIS — E119 Type 2 diabetes mellitus without complications: Secondary | ICD-10-CM | POA: Diagnosis not present

## 2016-07-26 ENCOUNTER — Other Ambulatory Visit (HOSPITAL_COMMUNITY)
Admission: RE | Admit: 2016-07-26 | Discharge: 2016-07-26 | Disposition: A | Discharge status: Home / Self Care | Payer: Medicare Other | Source: Other Acute Inpatient Hospital | Attending: Internal Medicine | Admitting: Internal Medicine

## 2016-07-26 ENCOUNTER — Encounter (HOSPITAL_BASED_OUTPATIENT_CLINIC_OR_DEPARTMENT_OTHER): Payer: Medicare Other | Attending: Internal Medicine

## 2016-07-26 DIAGNOSIS — J449 Chronic obstructive pulmonary disease, unspecified: Secondary | ICD-10-CM | POA: Diagnosis not present

## 2016-07-26 DIAGNOSIS — L97522 Non-pressure chronic ulcer of other part of left foot with fat layer exposed: Secondary | ICD-10-CM | POA: Insufficient documentation

## 2016-07-26 DIAGNOSIS — E1151 Type 2 diabetes mellitus with diabetic peripheral angiopathy without gangrene: Secondary | ICD-10-CM | POA: Diagnosis not present

## 2016-07-26 DIAGNOSIS — S91302A Unspecified open wound, left foot, initial encounter: Secondary | ICD-10-CM | POA: Insufficient documentation

## 2016-07-26 DIAGNOSIS — X58XXXA Exposure to other specified factors, initial encounter: Secondary | ICD-10-CM | POA: Insufficient documentation

## 2016-07-26 DIAGNOSIS — E11621 Type 2 diabetes mellitus with foot ulcer: Secondary | ICD-10-CM | POA: Insufficient documentation

## 2016-07-27 ENCOUNTER — Other Ambulatory Visit (INDEPENDENT_AMBULATORY_CARE_PROVIDER_SITE_OTHER): Payer: Medicare Other

## 2016-07-27 ENCOUNTER — Other Ambulatory Visit: Payer: Self-pay | Admitting: Family Medicine

## 2016-07-27 DIAGNOSIS — L97521 Non-pressure chronic ulcer of other part of left foot limited to breakdown of skin: Secondary | ICD-10-CM

## 2016-07-27 DIAGNOSIS — E08621 Diabetes mellitus due to underlying condition with foot ulcer: Secondary | ICD-10-CM

## 2016-07-29 LAB — AEROBIC CULTURE W GRAM STAIN (SUPERFICIAL SPECIMEN): Gram Stain: NONE SEEN

## 2016-07-29 LAB — AEROBIC CULTURE  (SUPERFICIAL SPECIMEN)

## 2016-08-02 ENCOUNTER — Encounter: Payer: Self-pay | Admitting: Family Medicine

## 2016-08-02 ENCOUNTER — Ambulatory Visit (INDEPENDENT_AMBULATORY_CARE_PROVIDER_SITE_OTHER): Payer: Medicare Other | Admitting: Family Medicine

## 2016-08-02 VITALS — BP 116/64 | HR 68 | Temp 97.2°F | Ht 66.0 in | Wt 148.0 lb

## 2016-08-02 DIAGNOSIS — E119 Type 2 diabetes mellitus without complications: Secondary | ICD-10-CM | POA: Diagnosis not present

## 2016-08-02 DIAGNOSIS — E11610 Type 2 diabetes mellitus with diabetic neuropathic arthropathy: Secondary | ICD-10-CM

## 2016-08-02 DIAGNOSIS — E538 Deficiency of other specified B group vitamins: Secondary | ICD-10-CM | POA: Diagnosis not present

## 2016-08-02 DIAGNOSIS — J449 Chronic obstructive pulmonary disease, unspecified: Secondary | ICD-10-CM

## 2016-08-02 DIAGNOSIS — S91302A Unspecified open wound, left foot, initial encounter: Secondary | ICD-10-CM

## 2016-08-02 DIAGNOSIS — E559 Vitamin D deficiency, unspecified: Secondary | ICD-10-CM | POA: Diagnosis not present

## 2016-08-02 DIAGNOSIS — I10 Essential (primary) hypertension: Secondary | ICD-10-CM

## 2016-08-02 DIAGNOSIS — E7800 Pure hypercholesterolemia, unspecified: Secondary | ICD-10-CM | POA: Diagnosis not present

## 2016-08-02 DIAGNOSIS — K219 Gastro-esophageal reflux disease without esophagitis: Secondary | ICD-10-CM | POA: Diagnosis not present

## 2016-08-02 DIAGNOSIS — I739 Peripheral vascular disease, unspecified: Secondary | ICD-10-CM | POA: Diagnosis not present

## 2016-08-02 LAB — BAYER DCA HB A1C WAIVED: HB A1C: 5.9 % (ref <7.0)

## 2016-08-02 NOTE — Patient Instructions (Addendum)
Medicare Annual Wellness Visit  [facility] and the medical providers at [facility] strive to bring you the best medical care.  In doing so we not only want to address your current medical conditions and concerns but also to detect new conditions early and prevent illness, disease and health-related problems.    Medicare offers a yearly Wellness Visit which allows our clinical staff to assess your need for preventative services including immunizations, lifestyle education, counseling to decrease risk of preventable diseases and screening for fall risk and other medical concerns.    This visit is provided free of charge (no copay) for all Medicare recipients. The clinical pharmacists at [facility] have begun to conduct these Wellness Visits which will also include a thorough review of all your medications.    As you primary medical provider recommend that you make an appointment for your Annual Wellness Visit if you have not done so already this year.  You may set up this appointment before you leave today or you may call back ([phone]) and schedule an appointment.  Please make sure when you call that you mention that you are scheduling your Annual Wellness Visit with the clinical pharmacist so that the appointment may be made for the proper length of time.     Continue current medications. Continue good therapeutic lifestyle changes which include good diet and exercise. Fall precautions discussed with patient. If an FOBT was given today- please return it to our front desk. If you are over 14 years old - you may need Prevnar 61 or the adult Pneumonia vaccine.  **Flu shots are available--- please call and schedule a FLU-CLINIC appointment**  After your visit with [place] today you will receive a survey in the mail or online from [employer] regarding your care with [place]. Please take a moment to fill this out. Your feedback is very  important to [place] as you can help [place] better understand your patient needs as well as improve your experience and satisfaction. WE CARE ABOUT YOU!!!   Follow-up with wound clinic as planned Follow-up with vascular surgery as planned Continue to keep dressing on foot Finish antibiotics and discuss with the wound clinic the need for any future antibiotics. Continue with aggressive therapeutic lifestyle changes as much as possible for cholesterol Return the FOBT
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
## 2016-08-03 LAB — BMP8+EGFR
BUN/Creatinine Ratio: 28 (ref 12–28)
BUN: 23 mg/dL (ref 8–27)
CO2: 32 mmol/L — ABNORMAL HIGH (ref 18–29)
CREATININE: 0.81 mg/dL (ref 0.57–1.00)
Calcium: 9.9 mg/dL (ref 8.7–10.3)
Chloride: 98 mmol/L (ref 96–106)
GFR calc Af Amer: 78 mL/min/{1.73_m2} (ref >59)
GFR, EST NON AFRICAN AMERICAN: 68 mL/min/{1.73_m2} (ref >59)
GLUCOSE: 67 mg/dL (ref 65–99)
Potassium: 3.8 mmol/L (ref 3.5–5.2)
Sodium: 146 mmol/L — ABNORMAL HIGH (ref 134–144)

## 2016-08-03 LAB — CBC WITH DIFFERENTIAL/PLATELET
BASOS: 1 %
Basophils Absolute: 0.1 10*3/uL (ref 0.0–0.2)
EOS (ABSOLUTE): 0.5 10*3/uL — ABNORMAL HIGH (ref 0.0–0.4)
EOS: 5 %
HEMOGLOBIN: 14.5 g/dL (ref 11.1–15.9)
Hematocrit: 43.8 % (ref 34.0–46.6)
IMMATURE GRANULOCYTES: 0 %
Immature Grans (Abs): 0 10*3/uL (ref 0.0–0.1)
LYMPHS ABS: 3.1 10*3/uL (ref 0.7–3.1)
Lymphs: 30 %
MCH: 27.8 pg (ref 26.6–33.0)
MCHC: 33.1 g/dL (ref 31.5–35.7)
MCV: 84 fL (ref 79–97)
MONOCYTES: 7 %
MONOS ABS: 0.7 10*3/uL (ref 0.1–0.9)
Neutrophils Absolute: 6.1 10*3/uL (ref 1.4–7.0)
Neutrophils: 57 %
Platelets: 188 10*3/uL (ref 150–379)
RBC: 5.22 x10E6/uL (ref 3.77–5.28)
RDW: 14.6 % (ref 12.3–15.4)
WBC: 10.5 10*3/uL (ref 3.4–10.8)

## 2016-08-03 LAB — MICROALBUMIN / CREATININE URINE RATIO
CREATININE, UR: 79.6 mg/dL
MICROALB/CREAT RATIO: 15.1 mg/g{creat} (ref 0.0–30.0)
Microalbumin, Urine: 12 ug/mL

## 2016-08-03 LAB — LIPID PANEL
CHOLESTEROL TOTAL: 162 mg/dL (ref 100–199)
Chol/HDL Ratio: 3.2 ratio (ref 0.0–4.4)
HDL: 51 mg/dL (ref >39)
LDL CALC: 81 mg/dL (ref 0–99)
TRIGLYCERIDES: 150 mg/dL — AB (ref 0–149)
VLDL Cholesterol Cal: 30 mg/dL (ref 5–40)

## 2016-08-03 LAB — HEPATIC FUNCTION PANEL
ALBUMIN: 4.3 g/dL (ref 3.5–4.7)
ALK PHOS: 45 IU/L (ref 39–117)
ALT: 9 IU/L (ref 0–32)
AST: 13 IU/L (ref 0–40)
BILIRUBIN TOTAL: 0.3 mg/dL (ref 0.0–1.2)
Bilirubin, Direct: 0.1 mg/dL (ref 0.00–0.40)
Total Protein: 6.8 g/dL (ref 6.0–8.5)

## 2016-08-03 LAB — VITAMIN D 25 HYDROXY (VIT D DEFICIENCY, FRACTURES): Vit D, 25-Hydroxy: 59.5 ng/mL (ref 30.0–100.0)

## 2016-08-10 DIAGNOSIS — F17209 Nicotine dependence, unspecified, with unspecified nicotine-induced disorders: Secondary | ICD-10-CM | POA: Diagnosis not present

## 2016-08-10 DIAGNOSIS — E11621 Type 2 diabetes mellitus with foot ulcer: Secondary | ICD-10-CM | POA: Diagnosis not present

## 2016-08-10 DIAGNOSIS — I739 Peripheral vascular disease, unspecified: Secondary | ICD-10-CM | POA: Diagnosis not present

## 2016-08-10 DIAGNOSIS — L97529 Non-pressure chronic ulcer of other part of left foot with unspecified severity: Secondary | ICD-10-CM | POA: Diagnosis not present

## 2016-08-10 DIAGNOSIS — B9561 Methicillin susceptible Staphylococcus aureus infection as the cause of diseases classified elsewhere: Secondary | ICD-10-CM | POA: Diagnosis not present

## 2016-08-16 ENCOUNTER — Ambulatory Visit (INDEPENDENT_AMBULATORY_CARE_PROVIDER_SITE_OTHER): Payer: Medicare Other | Admitting: *Deleted

## 2016-08-16 DIAGNOSIS — E538 Deficiency of other specified B group vitamins: Secondary | ICD-10-CM

## 2016-08-16 DIAGNOSIS — Z1211 Encounter for screening for malignant neoplasm of colon: Secondary | ICD-10-CM

## 2016-08-16 NOTE — Addendum Note (Signed)
Addended by: Zannie Cove on: 08/16/2016 03:19 PM   Modules accepted: Orders

## 2016-08-16 NOTE — Progress Notes (Signed)
Pt given Vit B12 inj Tolerated well 

## 2016-08-19 LAB — FECAL OCCULT BLOOD, IMMUNOCHEMICAL: Fecal Occult Bld: NEGATIVE

## 2016-08-21 ENCOUNTER — Other Ambulatory Visit: Payer: Self-pay | Admitting: Family Medicine

## 2016-08-24 ENCOUNTER — Encounter (HOSPITAL_BASED_OUTPATIENT_CLINIC_OR_DEPARTMENT_OTHER): Payer: Medicare Other | Attending: Internal Medicine

## 2016-08-24 DIAGNOSIS — L84 Corns and callosities: Secondary | ICD-10-CM | POA: Insufficient documentation

## 2016-08-24 DIAGNOSIS — J449 Chronic obstructive pulmonary disease, unspecified: Secondary | ICD-10-CM | POA: Insufficient documentation

## 2016-08-24 DIAGNOSIS — L97521 Non-pressure chronic ulcer of other part of left foot limited to breakdown of skin: Secondary | ICD-10-CM | POA: Insufficient documentation

## 2016-08-24 DIAGNOSIS — I509 Heart failure, unspecified: Secondary | ICD-10-CM | POA: Diagnosis not present

## 2016-08-24 DIAGNOSIS — F17200 Nicotine dependence, unspecified, uncomplicated: Secondary | ICD-10-CM | POA: Diagnosis not present

## 2016-08-24 DIAGNOSIS — E1151 Type 2 diabetes mellitus with diabetic peripheral angiopathy without gangrene: Secondary | ICD-10-CM | POA: Insufficient documentation

## 2016-08-24 DIAGNOSIS — E11621 Type 2 diabetes mellitus with foot ulcer: Secondary | ICD-10-CM | POA: Insufficient documentation

## 2016-08-27 ENCOUNTER — Encounter: Payer: Self-pay | Admitting: Surgery

## 2016-08-31 ENCOUNTER — Other Ambulatory Visit: Payer: Self-pay | Admitting: *Deleted

## 2016-08-31 ENCOUNTER — Telehealth: Payer: Self-pay | Admitting: Family Medicine

## 2016-08-31 MED ORDER — ALBUTEROL SULFATE (2.5 MG/3ML) 0.083% IN NEBU: INHALATION_SOLUTION | RESPIRATORY_TRACT | 5 refills | Status: DC

## 2016-08-31 NOTE — Progress Notes (Signed)
DX added to RX for Albuterol neb solution and resubmitted to pharmacy

## 2016-09-03 ENCOUNTER — Ambulatory Visit (INDEPENDENT_AMBULATORY_CARE_PROVIDER_SITE_OTHER)
Admission: RE | Admit: 2016-09-03 | Discharge: 2016-09-03 | Disposition: A | Discharge status: Home / Self Care | Payer: Medicare Other | Source: Ambulatory Visit | Attending: Surgery | Admitting: Surgery

## 2016-09-03 ENCOUNTER — Ambulatory Visit (INDEPENDENT_AMBULATORY_CARE_PROVIDER_SITE_OTHER): Payer: Medicare Other | Admitting: Surgery

## 2016-09-03 ENCOUNTER — Encounter: Payer: Self-pay | Admitting: Surgery

## 2016-09-03 ENCOUNTER — Telehealth: Payer: Self-pay | Admitting: Family Medicine

## 2016-09-03 ENCOUNTER — Ambulatory Visit (HOSPITAL_COMMUNITY)
Admission: RE | Admit: 2016-09-03 | Discharge: 2016-09-03 | Disposition: A | Discharge status: Home / Self Care | Payer: Medicare Other | Source: Ambulatory Visit | Attending: Surgery | Admitting: Surgery

## 2016-09-03 VITALS — BP 146/60 | HR 52 | Temp 97.4°F | Resp 16 | Ht 66.0 in | Wt 150.4 lb

## 2016-09-03 DIAGNOSIS — S81802D Unspecified open wound, left lower leg, subsequent encounter: Secondary | ICD-10-CM | POA: Insufficient documentation

## 2016-09-03 DIAGNOSIS — I7025 Atherosclerosis of native arteries of other extremities with ulceration: Secondary | ICD-10-CM | POA: Diagnosis not present

## 2016-09-03 DIAGNOSIS — I745 Embolism and thrombosis of iliac artery: Secondary | ICD-10-CM | POA: Diagnosis not present

## 2016-09-03 NOTE — Progress Notes (Signed)
V[facility] and [facility]in Specialist of [place]  Patient name: Katie Wilkins MRN: [id number] DOB: 1931-12-19 Sex: female   REASON FOR VISIT:    Follow up wound  HISOTRY OF PRESENT ILLNESS:    Katie Wilkins a 83 y.o.femalewho presents for continued follow-up of her nonhealing wound, left foot. The patient put Aspercreme, a copper sock and a heating pad on her left foot. She developed a severe wound her left foot.  During her workup she was found to have arterial insufficiency.  The Patient underwent angiography by Dr. 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 She also has a left SFA occlusion and her wound has been improving and therefore no further intervention on her left superficial femoral artery has occurred, because she has a flush occlusion, she will likely need a femoral to above-knee popliteal artery bypass graft  She is being treated at the wound center.  Her wound has healed, however she developed a crack in the skin and therefore she is going back to the wound center.  She does not have rest pain or claudication.   PAST MEDICAL HISTORY:   Past Medical History:  Diagnosis Date  . Abdominal bloating   . anemia iron deficiency   . Arthritis   . Bowen's disease   . Cardiomyopathy   . Cataract   . CHF (congestive heart failure) ([place])   . COPD (chronic obstructive pulmonary disease) ([place])   . Diabetes mellitus without complication ([place])   . Diarrhea   . Diverticulosis   . Heme + stool   . Hemorrhoids   . Hiatal hernia   . Hyperlipemia   . Mitral valve regurgitation   . Neoplasm    Malignant, anal canal, squamous cell  . Nodular goiter   . Osteoporosis   . Rectal bleeding   . Rectocele   . Renal cyst   . Vitamin D deficiency      FAMILY HISTORY:   Family History  Problem Relation Age of Onset  . Diabetes Mother   . Alzheimer's disease Mother   . Diabetes  Brother   . Alzheimer's disease Brother   . Colon cancer Neg Hx     SOCIAL HISTORY:   Social History  Substance Use Topics  . Smoking status: Current Every Day Smoker    Packs/day: 1.00    Years: 57.00    Types: Cigarettes    Start date: 03/19/1953  . Smokeless tobacco: Never Used     Comment: Less than 1 pk per day  . Alcohol use No     ALLERGIES:   Allergies  Allergen Reactions  . Ace Inhibitors Cough  . Codeine Other (See Comments)    Sleepy and crazy     CURRENT MEDICATIONS:   Current Outpatient Prescriptions  Medication Sig Dispense Refill  . acetaminophen (TYLENOL) 325 MG tablet Take 650 mg by mouth at bedtime.    Marland Kitchen albuterol (PROVENTIL HFA;VENTOLIN HFA) 108 (90 BASE) MCG/ACT inhaler Inhale 1 puff into the lungs every 4 (four) hours as needed for wheezing.     Marland Kitchen albuterol (PROVENTIL) (2.5 MG/3ML) 0.083% nebulizer solution USE ONE VIAL IN NEBULIZER EVERY 6 HOURS AS NEEDED 225 mL 5  . aspirin EC 81 MG tablet Take 81 mg by mouth daily.    . budesonide (PULMICORT) 0.5 MG/2ML nebulizer solution USE ONE VIAL IN NEBULIZER TWICE DAILY 120 mL 5  . Calcium-Vitamin D-Vitamin K (CALCIUM + D) [phone] MG-UNT-MCG CHEW Chew 1  tablet by mouth 2 (two) times daily.    . Cholecalciferol (VITAMIN D3) 1000 UNITS tablet Take 2,000 Units by mouth daily.     . cyanocobalamin (,VITAMIN B-12,) 1000 MCG/ML injection Inject 1,000 mcg into the muscle every 30 (thirty) days.     Marland Kitchen doxycycline (VIBRAMYCIN) 100 MG capsule Take 1 capsule (100 mg total) by mouth 2 (two) times daily. 20 capsule 0  . ferrous sulfate 325 (65 FE) MG tablet Take 325 mg by mouth 3 (three) times a week.     Marland Kitchen glucose blood (ONETOUCH VERIO) test strip USE TO CHECK GLUCOSE ONCE DAILY 100 each 9  . hydrochlorothiazide (HYDRODIURIL) 25 MG tablet TAKE ONE TABLET BY MOUTH ONCE DAILY 90 tablet 1  . levothyroxine (SYNTHROID, LEVOTHROID) 112 MCG tablet Take 112 mcg by mouth daily.    . meclizine (ANTIVERT) 32 MG tablet Take 1  tablet (32 mg total) by mouth 3 (three) times daily as needed. 30 tablet 6  . Misc. Devices (HUGO ROLLING WALKER ELITE) MISC Use daily for gait instability 1 each 0  . omega-3 acid ethyl esters (LOVAZA) 1 G capsule Take 1 g by mouth daily.     Glory Rosebush DELICA LANCETS FINE MISC Use  To check blood glucose once a day.  DX:  Type 2 DM E11.9 100 each 1  . ONETOUCH VERIO test strip USE ONE STRIP TO CHECK GLUCOSE ONCE DAILY 100 each 1  . Pitavastatin Calcium (LIVALO) 4 MG TABS Take 4 mg by mouth daily.      Current Facility-Administered Medications  Medication Dose Route Frequency Provider Last Rate Last Dose  . cyanocobalamin ((VITAMIN B-12)) injection 1,000 mcg  1,000 mcg Intramuscular Q30 days Chipper Herb, MD   1,000 mcg at 08/16/16 1019    REVIEW OF SYSTEMS:   [X]  denotes positive finding, [ ]  denotes negative finding Cardiac  Comments:  Chest pain or chest pressure:    Shortness of breath upon exertion:    Short of breath when lying flat:    Irregular heart rhythm:        Vascular    Pain in calf, thigh, or hip brought on by ambulation:    Pain in feet at night that wakes you up from your sleep:     Blood clot in your veins:    Leg swelling:         Pulmonary    Oxygen at home:    Productive cough:     Wheezing:         Neurologic    Sudden weakness in arms or legs:     Sudden numbness in arms or legs:     Sudden onset of difficulty speaking or slurred speech:    Temporary loss of vision in one eye:     Problems with dizziness:         Gastrointestinal    Blood in stool:     Vomited blood:         Genitourinary    Burning when urinating:     Blood in urine:        Psychiatric    Major depression:         Hematologic    Bleeding problems:    Problems with blood clotting too easily:        Skin    Rashes or ulcers: x       Constitutional    Fever or chills:      PHYSICAL EXAM:   Vitals:  09/03/16 0920  BP: (!) 146/60  Pulse: (!) 52  Resp: 16    Temp: 97.4 F (36.3 C)  TempSrc: Oral  SpO2: 92%  Weight: 150 lb 6.4 oz (68.2 kg)  Height: 5\' 6"  (1.676 m)    GENERAL: The patient is a well-nourished female, in no acute distress. The vital signs are documented above. CARDIAC: There is a regular rate and rhythm.  VASCULAR: Nonpalpable pedal pulses PULMONARY: Non-labored respirations MUSCULOSKELETAL: There are no major deformities or cyanosis. NEUROLOGIC: No focal weakness or paresthesias are detected. SKIN: The wound on the lateral side of the left foot has healed.  There is a callus-like skin formation with no open areas. PSYCHIATRIC: The patient has a normal affect.  STUDIES:   I have reviewed her vascular lab studies.  The ABI on the right is 0.91.  On the left is 0.46.  Her previous ABI on the left was 0.59.  There appeared to be velocities greater than 50% throughout the stent, which is an increase from her prior study.  MEDICAL ISSUES:   I discussed with the patient that given the fact that her wound has opened up recently although it is now closed, with elevated velocities within her stent, I am concerned that she is at risk for stent failure.  I have recommended proceeding with angiography and intervention of her left iliac stent.  She is to liberating as to whether or not to proceed.  Hopefully, we can get this scheduled within the next 2 weeks.    Annamarie Major, MD [facility] [phone] Pager [phone]

## 2016-09-03 NOTE — Telephone Encounter (Signed)
Notified pharmacy of DX code Pt also notified

## 2016-09-04 ENCOUNTER — Other Ambulatory Visit: Payer: Self-pay | Admitting: *Deleted

## 2016-09-04 ENCOUNTER — Other Ambulatory Visit: Payer: Self-pay

## 2016-09-04 DIAGNOSIS — J449 Chronic obstructive pulmonary disease, unspecified: Secondary | ICD-10-CM | POA: Diagnosis not present

## 2016-09-04 MED ORDER — ALBUTEROL SULFATE (2.5 MG/3ML) 0.083% IN NEBU: INHALATION_SOLUTION | RESPIRATORY_TRACT | 5 refills | Status: DC

## 2016-09-04 NOTE — Telephone Encounter (Signed)
DONE- PHARM AWARE

## 2016-09-05 ENCOUNTER — Other Ambulatory Visit: Payer: Self-pay

## 2016-09-05 MED ORDER — ALBUTEROL SULFATE (2.5 MG/3ML) 0.083% IN NEBU: INHALATION_SOLUTION | RESPIRATORY_TRACT | 5 refills | Status: DC

## 2016-09-07 DIAGNOSIS — E11621 Type 2 diabetes mellitus with foot ulcer: Secondary | ICD-10-CM | POA: Diagnosis not present

## 2016-09-07 DIAGNOSIS — T25022A Burn of unspecified degree of left foot, initial encounter: Secondary | ICD-10-CM | POA: Diagnosis not present

## 2016-09-11 ENCOUNTER — Telehealth: Payer: Self-pay | Admitting: Family

## 2016-09-11 ENCOUNTER — Encounter (HOSPITAL_COMMUNITY)
Admission: RE | Disposition: A | Discharge status: Home / Self Care | Payer: Self-pay | Source: Ambulatory Visit | Attending: Surgery

## 2016-09-11 ENCOUNTER — Ambulatory Visit (HOSPITAL_COMMUNITY)
Admission: RE | Admit: 2016-09-11 | Discharge: 2016-09-11 | Disposition: A | Discharge status: Home / Self Care | Payer: Medicare Other | Source: Ambulatory Visit | Attending: Surgery | Admitting: Surgery

## 2016-09-11 DIAGNOSIS — E785 Hyperlipidemia, unspecified: Secondary | ICD-10-CM | POA: Insufficient documentation

## 2016-09-11 DIAGNOSIS — I70203 Unspecified atherosclerosis of native arteries of extremities, bilateral legs: Secondary | ICD-10-CM | POA: Insufficient documentation

## 2016-09-11 DIAGNOSIS — E1151 Type 2 diabetes mellitus with diabetic peripheral angiopathy without gangrene: Secondary | ICD-10-CM | POA: Diagnosis not present

## 2016-09-11 DIAGNOSIS — Z7982 Long term (current) use of aspirin: Secondary | ICD-10-CM | POA: Insufficient documentation

## 2016-09-11 DIAGNOSIS — Y831 Surgical operation with implant of artificial internal device as the cause of abnormal reaction of the patient, or of later complication, without mention of misadventure at the time of the procedure: Secondary | ICD-10-CM | POA: Insufficient documentation

## 2016-09-11 DIAGNOSIS — F17210 Nicotine dependence, cigarettes, uncomplicated: Secondary | ICD-10-CM | POA: Diagnosis not present

## 2016-09-11 DIAGNOSIS — M1990 Unspecified osteoarthritis, unspecified site: Secondary | ICD-10-CM | POA: Insufficient documentation

## 2016-09-11 DIAGNOSIS — T82856A Stenosis of peripheral vascular stent, initial encounter: Secondary | ICD-10-CM | POA: Insufficient documentation

## 2016-09-11 DIAGNOSIS — M810 Age-related osteoporosis without current pathological fracture: Secondary | ICD-10-CM | POA: Insufficient documentation

## 2016-09-11 DIAGNOSIS — Z7951 Long term (current) use of inhaled steroids: Secondary | ICD-10-CM | POA: Insufficient documentation

## 2016-09-11 DIAGNOSIS — E559 Vitamin D deficiency, unspecified: Secondary | ICD-10-CM | POA: Insufficient documentation

## 2016-09-11 DIAGNOSIS — J449 Chronic obstructive pulmonary disease, unspecified: Secondary | ICD-10-CM | POA: Insufficient documentation

## 2016-09-11 DIAGNOSIS — I509 Heart failure, unspecified: Secondary | ICD-10-CM | POA: Insufficient documentation

## 2016-09-11 DIAGNOSIS — I429 Cardiomyopathy, unspecified: Secondary | ICD-10-CM | POA: Insufficient documentation

## 2016-09-11 DIAGNOSIS — D509 Iron deficiency anemia, unspecified: Secondary | ICD-10-CM | POA: Diagnosis not present

## 2016-09-11 HISTORY — PX: PERIPHERAL VASCULAR BALLOON ANGIOPLASTY: CATH118281

## 2016-09-11 HISTORY — PX: ABDOMINAL AORTOGRAM W/LOWER EXTREMITY: CATH118223

## 2016-09-11 LAB — POCT I-STAT, CHEM 8
BUN: 24 mg/dL — ABNORMAL HIGH (ref 6–20)
CREATININE: 0.9 mg/dL (ref 0.44–1.00)
Calcium, Ion: 1.21 mmol/L (ref 1.15–1.40)
Chloride: 99 mmol/L — ABNORMAL LOW (ref 101–111)
Glucose, Bld: 95 mg/dL (ref 65–99)
HEMATOCRIT: 42 % (ref 36.0–46.0)
HEMOGLOBIN: 14.3 g/dL (ref 12.0–15.0)
Potassium: 3.7 mmol/L (ref 3.5–5.1)
Sodium: 142 mmol/L (ref 135–145)
TCO2: 33 mmol/L (ref 0–100)

## 2016-09-11 LAB — GLUCOSE, CAPILLARY: Glucose-Capillary: 83 mg/dL (ref 65–99)

## 2016-09-11 LAB — POCT ACTIVATED CLOTTING TIME
ACTIVATED CLOTTING TIME: 180 s
ACTIVATED CLOTTING TIME: 213 s

## 2016-09-11 SURGERY — ABDOMINAL AORTOGRAM W/LOWER EXTREMITY
Anesthesia: LOCAL

## 2016-09-11 MED ORDER — MIDAZOLAM HCL 2 MG/2ML IJ SOLN
INTRAMUSCULAR | Status: DC | PRN
  Administered 2016-09-11: 1 mg via INTRAVENOUS

## 2016-09-11 MED ORDER — HEPARIN (PORCINE) IN NACL 2-0.9 UNIT/ML-% IJ SOLN
INTRAMUSCULAR | Status: AC
  Filled 2016-09-11: qty 1000

## 2016-09-11 MED ORDER — HEPARIN SODIUM (PORCINE) 1000 UNIT/ML IJ SOLN
INTRAMUSCULAR | Status: DC | PRN
  Administered 2016-09-11: 6000 [IU] via INTRAVENOUS

## 2016-09-11 MED ORDER — ACETAMINOPHEN 325 MG RE SUPP: 325.0000 mg | RECTAL | Status: DC | PRN

## 2016-09-11 MED ORDER — FENTANYL CITRATE (PF) 100 MCG/2ML IJ SOLN
INTRAMUSCULAR | Status: DC | PRN
  Administered 2016-09-11: 25 ug via INTRAVENOUS

## 2016-09-11 MED ORDER — PHENOL 1.4 % MT LIQD: 1.0000 | OROMUCOSAL | Status: DC | PRN

## 2016-09-11 MED ORDER — METOPROLOL TARTRATE 5 MG/5ML IV SOLN: 2.0000 mg | INTRAVENOUS | Status: DC | PRN

## 2016-09-11 MED ORDER — HEPARIN SODIUM (PORCINE) 1000 UNIT/ML IJ SOLN
INTRAMUSCULAR | Status: AC
  Filled 2016-09-11: qty 1

## 2016-09-11 MED ORDER — MIDAZOLAM HCL 2 MG/2ML IJ SOLN
INTRAMUSCULAR | Status: AC
  Filled 2016-09-11: qty 2

## 2016-09-11 MED ORDER — HYDRALAZINE HCL 20 MG/ML IJ SOLN: 5.0000 mg | INTRAMUSCULAR | Status: DC | PRN

## 2016-09-11 MED ORDER — SODIUM CHLORIDE 0.9 % IV SOLN: 500.0000 mL | Freq: Once | INTRAVENOUS | Status: DC | PRN

## 2016-09-11 MED ORDER — HEPARIN (PORCINE) IN NACL 2-0.9 UNIT/ML-% IJ SOLN
INTRAMUSCULAR | Status: AC | PRN
  Administered 2016-09-11: 1000 mL via INTRA_ARTERIAL

## 2016-09-11 MED ORDER — ACETAMINOPHEN 325 MG PO TABS: 325.0000 mg | ORAL_TABLET | ORAL | Status: DC | PRN

## 2016-09-11 MED ORDER — LIDOCAINE HCL (PF) 1 % IJ SOLN
INTRAMUSCULAR | Status: AC
  Filled 2016-09-11: qty 30

## 2016-09-11 MED ORDER — FENTANYL CITRATE (PF) 100 MCG/2ML IJ SOLN
INTRAMUSCULAR | Status: AC
  Filled 2016-09-11: qty 2

## 2016-09-11 MED ORDER — SODIUM CHLORIDE 0.9 % IV SOLN: 1.0000 mL/kg/h | INTRAVENOUS | Status: DC

## 2016-09-11 MED ORDER — GUAIFENESIN-DM 100-10 MG/5ML PO SYRP: 15.0000 mL | ORAL_SOLUTION | ORAL | Status: DC | PRN

## 2016-09-11 MED ORDER — LIDOCAINE HCL (PF) 1 % IJ SOLN
INTRAMUSCULAR | Status: DC | PRN
  Administered 2016-09-11: 30 mL via INTRADERMAL

## 2016-09-11 MED ORDER — MORPHINE SULFATE (PF) 10 MG/ML IV SOLN: 2.0000 mg | INTRAVENOUS | Status: DC | PRN

## 2016-09-11 MED ORDER — LABETALOL HCL 5 MG/ML IV SOLN: 10.0000 mg | INTRAVENOUS | Status: DC | PRN

## 2016-09-11 MED ORDER — DOCUSATE SODIUM 100 MG PO CAPS: 100.0000 mg | ORAL_CAPSULE | Freq: Every day | ORAL | Status: DC

## 2016-09-11 MED ORDER — SODIUM CHLORIDE 0.9 % IV SOLN
INTRAVENOUS | Status: DC
  Administered 2016-09-11: 11:00:00 via INTRAVENOUS

## 2016-09-11 MED ORDER — ALUM & MAG HYDROXIDE-SIMETH 200-200-20 MG/5ML PO SUSP: 15.0000 mL | ORAL | Status: DC | PRN

## 2016-09-11 MED ORDER — ONDANSETRON HCL 4 MG/2ML IJ SOLN: 4.0000 mg | Freq: Four times a day (QID) | INTRAMUSCULAR | Status: DC | PRN

## 2016-09-11 SURGICAL SUPPLY — 13 items
BALLN MUSTANG 7.0X20 75 (BALLOONS) ×3
BALLOON MUSTANG 7.0X20 75 (BALLOONS) IMPLANT
CATH OMNI FLUSH 5F 65CM (CATHETERS) ×1 IMPLANT
CATH STRAIGHT 5FR 65CM (CATHETERS) ×1 IMPLANT
KIT ENCORE 26 ADVANTAGE (KITS) ×1 IMPLANT
KIT MICROINTRODUCER STIFF 5F (SHEATH) ×1 IMPLANT
KIT PV (KITS) ×3 IMPLANT
SHEATH PINNACLE 5F 10CM (SHEATH) ×1 IMPLANT
SHEATH PINNACLE ST 6F 45CM (SHEATH) ×1 IMPLANT
SYR MEDRAD MARK V 150ML (SYRINGE) ×3 IMPLANT
TRANSDUCER W/STOPCOCK (MISCELLANEOUS) ×3 IMPLANT
TRAY PV CATH (CUSTOM PROCEDURE TRAY) ×3 IMPLANT
WIRE BENTSON .035X145CM (WIRE) ×1 IMPLANT

## 2016-09-11 NOTE — Interval H&P Note (Signed)
History and Physical Interval Note:  09/11/2016 11:39 AM  Katie Wilkins  has presented today for surgery, with the diagnosis of pvd w/left foot wound  The various methods of treatment have been discussed with the patient and family. After consideration of risks, benefits and other options for treatment, the patient has consented to  Procedure(s): Abdominal Aortogram w/Lower Extremity (N/A) as a surgical intervention .  The patient's history has been reviewed, patient examined, no change in status, stable for surgery.  I have reviewed the patient's chart and labs.  Questions were answered to the patient's satisfaction.     Annamarie Major

## 2016-09-11 NOTE — Telephone Encounter (Signed)
-----   Message from Mena Goes, RN sent at 09/11/2016  2:00 PM EDT ----- Regarding: 6 months w/ 2 labs   ----- Message ----- From: Serafina Mitchell, MD Sent: 09/11/2016  12:57 PM To: [facility] Charge Pool  09/11/2016:  Surgeon:  Annamarie Major Procedure Performed:  1.  Ultrasound-guided access, right femoral artery  2.  Abdominal aortogram  3.  Bilateral lower extremity runoff  4.  Angioplasty, left common iliac artery  5.  Conscious sedation (23minutes)    Follow-up 6 months with Vinnie Level with aortoiliac duplex and ABI

## 2016-09-11 NOTE — H&P (View-Only) (Signed)
Vascular and Vein Specialist of [place]  Patient name: Katie Wilkins MRN: [id number] DOB: Mar 11, 1932 Sex: female   REASON FOR VISIT:    Follow up wound  HISOTRY OF PRESENT ILLNESS:    [name] a 83 y.o.femalewho presents for continued follow-up of her nonhealing wound, left foot. The patient put Aspercreme, a copper sock and a heating pad on her left foot. She developed a severe wound her left foot.  During her workup she was found to have arterial insufficiency.  The Patient underwent angiography by Dr. 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 She also has a left SFA occlusion and her wound has been improving and therefore no further intervention on her left superficial femoral artery has occurred, because she has a flush occlusion, she will likely need a femoral to above-knee popliteal artery bypass graft  She is being treated at the wound center.  Her wound has healed, however she developed a crack in the skin and therefore she is going back to the wound center.  She does not have rest pain or claudication.   PAST MEDICAL HISTORY:   Past Medical History:  Diagnosis Date  . Abdominal bloating   . anemia iron deficiency   . Arthritis   . Bowen's disease   . Cardiomyopathy   . Cataract   . CHF (congestive heart failure) ([place])   . COPD (chronic obstructive pulmonary disease) ([place])   . Diabetes mellitus without complication ([place])   . Diarrhea   . Diverticulosis   . Heme + stool   . Hemorrhoids   . Hiatal hernia   . Hyperlipemia   . Mitral valve regurgitation   . Neoplasm    Malignant, anal canal, squamous cell  . Nodular goiter   . Osteoporosis   . Rectal bleeding   . Rectocele   . Renal cyst   . Vitamin D deficiency      FAMILY HISTORY:   Family History  Problem Relation Age of Onset  . Diabetes Mother   . Alzheimer's disease Mother   . Diabetes  Brother   . Alzheimer's disease Brother   . Colon cancer Neg Hx     SOCIAL HISTORY:   Social History  Substance Use Topics  . Smoking status: Current Every Day Smoker    Packs/day: 1.00    Years: 57.00    Types: Cigarettes    Start date: 03/19/1953  . Smokeless tobacco: Never Used     Comment: Less than 1 pk per day  . Alcohol use No     ALLERGIES:   Allergies  Allergen Reactions  . Ace Inhibitors Cough  . Codeine Other (See Comments)    Sleepy and crazy     CURRENT MEDICATIONS:   Current Outpatient Prescriptions  Medication Sig Dispense Refill  . acetaminophen (TYLENOL) 325 MG tablet Take 650 mg by mouth at bedtime.    Marland Kitchen albuterol (PROVENTIL HFA;VENTOLIN HFA) 108 (90 BASE) MCG/ACT inhaler Inhale 1 puff into the lungs every 4 (four) hours as needed for wheezing.     Marland Kitchen albuterol (PROVENTIL) (2.5 MG/3ML) 0.083% nebulizer solution USE ONE VIAL IN NEBULIZER EVERY 6 HOURS AS NEEDED 225 mL 5  . aspirin EC 81 MG tablet Take 81 mg by mouth daily.    . budesonide (PULMICORT) 0.5 MG/2ML nebulizer solution USE ONE VIAL IN NEBULIZER TWICE DAILY 120 mL 5  . Calcium-Vitamin D-Vitamin K (CALCIUM + D) [phone] MG-UNT-MCG CHEW Chew 1  tablet by mouth 2 (two) times daily.    . Cholecalciferol (VITAMIN D3) 1000 UNITS tablet Take 2,000 Units by mouth daily.     . cyanocobalamin (,VITAMIN B-12,) 1000 MCG/ML injection Inject 1,000 mcg into the muscle every 30 (thirty) days.     Marland Kitchen doxycycline (VIBRAMYCIN) 100 MG capsule Take 1 capsule (100 mg total) by mouth 2 (two) times daily. 20 capsule 0  . ferrous sulfate 325 (65 FE) MG tablet Take 325 mg by mouth 3 (three) times a week.     Marland Kitchen glucose blood (ONETOUCH VERIO) test strip USE TO CHECK GLUCOSE ONCE DAILY 100 each 9  . hydrochlorothiazide (HYDRODIURIL) 25 MG tablet TAKE ONE TABLET BY MOUTH ONCE DAILY 90 tablet 1  . levothyroxine (SYNTHROID, LEVOTHROID) 112 MCG tablet Take 112 mcg by mouth daily.    . meclizine (ANTIVERT) 32 MG tablet Take 1  tablet (32 mg total) by mouth 3 (three) times daily as needed. 30 tablet 6  . Misc. Devices (HUGO ROLLING WALKER ELITE) MISC Use daily for gait instability 1 each 0  . omega-3 acid ethyl esters (LOVAZA) 1 G capsule Take 1 g by mouth daily.     Glory Rosebush DELICA LANCETS FINE MISC Use  To check blood glucose once a day.  DX:  Type 2 DM E11.9 100 each 1  . ONETOUCH VERIO test strip USE ONE STRIP TO CHECK GLUCOSE ONCE DAILY 100 each 1  . Pitavastatin Calcium (LIVALO) 4 MG TABS Take 4 mg by mouth daily.      Current Facility-Administered Medications  Medication Dose Route Frequency Provider Last Rate Last Dose  . cyanocobalamin ((VITAMIN B-12)) injection 1,000 mcg  1,000 mcg Intramuscular Q30 days Chipper Herb, MD   1,000 mcg at 08/16/16 1019    REVIEW OF SYSTEMS:   [X]  denotes positive finding, [ ]  denotes negative finding Cardiac  Comments:  Chest pain or chest pressure:    Shortness of breath upon exertion:    Short of breath when lying flat:    Irregular heart rhythm:        Vascular    Pain in calf, thigh, or hip brought on by ambulation:    Pain in feet at night that wakes you up from your sleep:     Blood clot in your veins:    Leg swelling:         Pulmonary    Oxygen at home:    Productive cough:     Wheezing:         Neurologic    Sudden weakness in arms or legs:     Sudden numbness in arms or legs:     Sudden onset of difficulty speaking or slurred speech:    Temporary loss of vision in one eye:     Problems with dizziness:         Gastrointestinal    Blood in stool:     Vomited blood:         Genitourinary    Burning when urinating:     Blood in urine:        Psychiatric    Major depression:         Hematologic    Bleeding problems:    Problems with blood clotting too easily:        Skin    Rashes or ulcers: x       Constitutional    Fever or chills:      PHYSICAL EXAM:   Vitals:  09/03/16 0920  BP: (!) 146/60  Pulse: (!) 52  Resp: 16    Temp: 97.4 F (36.3 C)  TempSrc: Oral  SpO2: 92%  Weight: 150 lb 6.4 oz (68.2 kg)  Height: 5\' 6"  (1.676 m)    GENERAL: The patient is a well-nourished female, in no acute distress. The vital signs are documented above. CARDIAC: There is a regular rate and rhythm.  VASCULAR: Nonpalpable pedal pulses PULMONARY: Non-labored respirations MUSCULOSKELETAL: There are no major deformities or cyanosis. NEUROLOGIC: No focal weakness or paresthesias are detected. SKIN: The wound on the lateral side of the left foot has healed.  There is a callus-like skin formation with no open areas. PSYCHIATRIC: The patient has a normal affect.  STUDIES:   I have reviewed her vascular lab studies.  The ABI on the right is 0.91.  On the left is 0.46.  Her previous ABI on the left was 0.59.  There appeared to be velocities greater than 50% throughout the stent, which is an increase from her prior study.  MEDICAL ISSUES:   I discussed with the patient that given the fact that her wound has opened up recently although it is now closed, with elevated velocities within her stent, I am concerned that she is at risk for stent failure.  I have recommended proceeding with angiography and intervention of her left iliac stent.  She is to liberating as to whether or not to proceed.  Hopefully, we can get this scheduled within the next 2 weeks.    Annamarie Major, MD [facility] [phone] Pager [phone]

## 2016-09-11 NOTE — Discharge Instructions (Signed)

## 2016-09-11 NOTE — Progress Notes (Signed)
Site area: rt groin fa sheath Site Prior to Removal:  Level 0 Pressure Applied For: 20 minutes  Manual:   Yes  Patient Status During Pull:  stable Post Pull Site:  Level 0 Post Pull Instructions Given:  yes Post Pull Pulses Present: dopplered Dressing Applied:  Gauze and tegaderm Bedrest begins @ 1478 Comments:

## 2016-09-11 NOTE — Telephone Encounter (Signed)
sched appt 04/08/16; lab at 8:00 and NP at 9:15. Mailed appt letter.

## 2016-09-11 NOTE — Op Note (Signed)
Patient name: Katie Wilkins MRN: [id number] DOB: [birth year] Sex: female  09/11/2016 Pre-operative Diagnosis: End stent stenosis, left common iliac artery Post-operative diagnosis:  Same Surgeon:  Annamarie Major Procedure Performed:  1.  Ultrasound-guided access, right femoral artery  2.  Abdominal aortogram  3.  Bilateral lower extremity runoff  4.  Angioplasty, left common iliac artery  5.  Conscious sedation (4minutes)     Indications:  The patient has a history of an occluded left common iliac artery.  This is been recanalized and stented with a 7 mm Bard covered stent.  She was able to heal all of her wounds.  She had in-stent stenosis on ultrasound and is here today for further evaluation  Procedure:  The patient was identified in the holding area and taken to room 8.  The patient was then placed supine on the table and prepped and draped in the usual sterile fashion.  A time out was called.  Conscious sedation was administered with the use of IV fentanyl and Versed in a continuous physician and nurse monitoring.  Heart rate, blood pressure, and oxygen saturation continuously monitored.  Ultrasound was used to evaluate the right common femoral artery.  It was patent .  A digital ultrasound image was acquired.  A micropuncture needle was used to access the right common femoral artery under ultrasound guidance.  An 018 wire was advanced without resistance and a micropuncture sheath was placed.  The 018 wire was removed and a benson wire was placed.  The micropuncture sheath was exchanged for a 5 french sheath.  An omniflush catheter was advanced over the wire to the level of L-1.  An abdominal angiogram was obtained.  Next, using the omniflush catheter and a benson wire, the aortic bifurcation was crossed and the catheter was placed into theleft external iliac artery and left runoff was obtained.  right runoff was performed via retrograde sheath injections.  Findings:   Aortogram:  No  significant renal artery stenosis.  The infrarenal down aorta is widely patent.  The right common and external iliac artery are widely patent.  There is a stent within the left common iliac artery.  The left external iliac artery is patent without stenosis.  Pullback pressure across the aorta and left common iliac stent was approximately 30 mm.  Right Lower Extremity:  The right common femoral and profunda femoral artery are patent throughout her course.  The superficial femoral artery is patent throughout it's course but small in caliber.  There is a 70% stenosis within the popliteal artery.  There is two-vessel runoff via the anterior tibial and peroneal artery  Left Lower Extremity:  The left common femoral and profunda femoral artery are patent throughout their course.  The superficial femoral artery is occluded.  There is reconstitution of the popliteal artery.  There is two-vessel runoff via the anterior tibial peroneal artery  Intervention:  After the above images were acquired the decision was made to proceed with intervention.  Over an 035 wire, a 6 French 45 cm sheath was advanced in the left external iliac artery.  The patient's fully heparinized.  I selected a 7 x 20 Mustang balloon and perform balloon angioplasty of the left common iliac stent.  This was done multiple times.  Completion imaging showed improved opacification throughout this area.  The pressure gradient had also decreased to less than 10.  Catheters and wires were removed.  The patient taken the holding area for sheath pull and sequentially file  corrects.  Impression:  #1  30 mm pressure gradient across the left common iliac stent.  This was successfully dilated with primary angioplasty using a 7 mm balloon with residual gradient less than 10 mm.  #2  left superficial femoral artery occlusion  #3  should the patient require repeat intervention on the left common iliac artery, preferential access would be from the left  groin.    Theotis Burrow, M.D. [facility] [place] Office: [phone] Pager:  [phone]

## 2016-09-12 ENCOUNTER — Encounter (HOSPITAL_COMMUNITY): Payer: Self-pay | Admitting: Surgery

## 2016-09-17 ENCOUNTER — Ambulatory Visit (INDEPENDENT_AMBULATORY_CARE_PROVIDER_SITE_OTHER): Payer: Medicare Other

## 2016-09-17 DIAGNOSIS — E538 Deficiency of other specified B group vitamins: Secondary | ICD-10-CM | POA: Diagnosis not present

## 2016-09-17 DIAGNOSIS — D509 Iron deficiency anemia, unspecified: Secondary | ICD-10-CM

## 2016-09-17 NOTE — Patient Instructions (Signed)
Cyanocobalamin, Vitamin B12 injection What is this medicine? CYANOCOBALAMIN (sye an oh koe BAL a min) is a man made form of vitamin B12. Vitamin B12 is used in the growth of healthy blood cells, nerve cells, and proteins in the body. It also helps with the metabolism of fats and carbohydrates. This medicine is used to treat people who can not absorb vitamin B12. This medicine may be used for other purposes; ask your health care provider or pharmacist if you have questions. COMMON BRAND NAME(S): B-12 Compliance Kit, B-12 Injection Kit, Cyomin, LA-12, Nutri-Twelve, Physicians EZ Use B-12, Primabalt What should I tell my health care provider before I take this medicine? They need to know if you have any of these conditions: -kidney disease -Leber's disease -megaloblastic anemia -an unusual or allergic reaction to cyanocobalamin, cobalt, other medicines, foods, dyes, or preservatives -pregnant or trying to get pregnant -breast-feeding How should I use this medicine? This medicine is injected into a muscle or deeply under the skin. It is usually given by a health care professional in a clinic or doctor's office. However, your doctor may teach you how to inject yourself. Follow all instructions. Talk to your pediatrician regarding the use of this medicine in children. Special care may be needed. Overdosage: If you think you have taken too much of this medicine contact a poison control center or emergency room at once. NOTE: This medicine is only for you. Do not share this medicine with others. What if I miss a dose? If you are given your dose at a clinic or doctor's office, call to reschedule your appointment. If you give your own injections and you miss a dose, take it as soon as you can. If it is almost time for your next dose, take only that dose. Do not take double or extra doses. What may interact with this medicine? -colchicine -heavy alcohol intake This list may not describe all possible  interactions. Give your health care provider a list of all the medicines, herbs, non-prescription drugs, or dietary supplements you use. Also tell them if you smoke, drink alcohol, or use illegal drugs. Some items may interact with your medicine. What should I watch for while using this medicine? Visit your doctor or health care professional regularly. You may need blood work done while you are taking this medicine. You may need to follow a special diet. Talk to your doctor. Limit your alcohol intake and avoid smoking to get the best benefit. What side effects may I notice from receiving this medicine? Side effects that you should report to your doctor or health care professional as soon as possible: -allergic reactions like skin rash, itching or hives, swelling of the face, lips, or tongue -blue tint to skin -chest tightness, pain -difficulty breathing, wheezing -dizziness -red, swollen painful area on the leg Side effects that usually do not require medical attention (report to your doctor or health care professional if they continue or are bothersome): -diarrhea -headache This list may not describe all possible side effects. Call your doctor for medical advice about side effects. You may report side effects to FDA at 1-800-FDA-1088. Where should I keep my medicine? Keep out of the reach of children. Store at room temperature between 15 and 30 degrees C (59 and 85 degrees F). Protect from light. Throw away any unused medicine after the expiration date. NOTE: This sheet is a summary. It may not cover all possible information. If you have questions about this medicine, talk to your doctor, pharmacist, or   health care provider.  2018 Elsevier/Gold Standard (2007-06-16 22:10:20)  

## 2016-09-17 NOTE — Progress Notes (Signed)
B12 injection given to right deltoid.  Patient tolerated well. 

## 2016-09-21 ENCOUNTER — Encounter (HOSPITAL_BASED_OUTPATIENT_CLINIC_OR_DEPARTMENT_OTHER): Payer: Medicare Other | Attending: Internal Medicine

## 2016-09-21 DIAGNOSIS — T25222A Burn of second degree of left foot, initial encounter: Secondary | ICD-10-CM | POA: Diagnosis not present

## 2016-09-21 DIAGNOSIS — Z95820 Peripheral vascular angioplasty status with implants and grafts: Secondary | ICD-10-CM | POA: Diagnosis not present

## 2016-09-21 DIAGNOSIS — E1151 Type 2 diabetes mellitus with diabetic peripheral angiopathy without gangrene: Secondary | ICD-10-CM | POA: Diagnosis not present

## 2016-09-21 DIAGNOSIS — Z872 Personal history of diseases of the skin and subcutaneous tissue: Secondary | ICD-10-CM | POA: Diagnosis not present

## 2016-09-21 DIAGNOSIS — I509 Heart failure, unspecified: Secondary | ICD-10-CM | POA: Diagnosis not present

## 2016-09-21 DIAGNOSIS — Z09 Encounter for follow-up examination after completed treatment for conditions other than malignant neoplasm: Secondary | ICD-10-CM | POA: Diagnosis present

## 2016-09-21 DIAGNOSIS — I70202 Unspecified atherosclerosis of native arteries of extremities, left leg: Secondary | ICD-10-CM | POA: Diagnosis not present

## 2016-09-21 DIAGNOSIS — J449 Chronic obstructive pulmonary disease, unspecified: Secondary | ICD-10-CM | POA: Diagnosis not present

## 2016-10-04 ENCOUNTER — Other Ambulatory Visit: Payer: Self-pay | Admitting: Family Medicine

## 2016-10-05 ENCOUNTER — Telehealth: Payer: Self-pay | Admitting: Family Medicine

## 2016-10-05 NOTE — Telephone Encounter (Signed)
Done 10/04/16

## 2016-10-08 ENCOUNTER — Ambulatory Visit (INDEPENDENT_AMBULATORY_CARE_PROVIDER_SITE_OTHER): Payer: Medicare Other | Admitting: Family Medicine

## 2016-10-08 ENCOUNTER — Encounter: Payer: Self-pay | Admitting: Family Medicine

## 2016-10-08 VITALS — BP 134/71 | HR 52 | Temp 96.8°F | Ht 67.5 in | Wt 150.4 lb

## 2016-10-08 DIAGNOSIS — S81802D Unspecified open wound, left lower leg, subsequent encounter: Secondary | ICD-10-CM

## 2016-10-08 MED ORDER — CEPHALEXIN 500 MG PO CAPS: 500.0000 mg | ORAL_CAPSULE | Freq: Three times a day (TID) | ORAL | 0 refills | Status: DC

## 2016-10-08 NOTE — Patient Instructions (Signed)
Great to see yoU!  Start keflex today, use a simple bandaid and vaseline for a few days, then return to routine care.

## 2016-10-08 NOTE — Progress Notes (Signed)
   HPI  Patient presents today here with tenderness in the left foot wound.  Patient has had chronic nonhealing wound on the left lateral foot for about 1-1/2 years. She's been to wound care multiple times and released twice. The last time she was released was about 2-1/2 weeks ago. She reports 4-5 days of tenderness of the left foot wound. It is currently crusted with no drainage.  She denies fever, chills, sweats. She has mild soft edema of the left lower extremity but nothing else that is concerning.  They would like to try antibiotics to be sure there is no residual or developing infection.  PMH: Smoking status noted ROS: Per HPI  Objective: BP 134/71   Pulse (!) 52   Temp (!) 96.8 F (36 C) (Oral)   Ht 5' 7.5" (1.715 m)   Wt 150 lb 6.4 oz (68.2 kg)   BMI 23.21 kg/m  Gen: NAD, alert, cooperative with exam HEENT: NCAT, EOMI, PERRL CV: RRR, good S1/S2, no murmur Resp: CTABL, no wheezes, non-labored Ext: No edema, warm Neuro: Alert and oriented, No gross deficits Skin:  Left lateral foot with approximately 1 cm x 5-7 mm thick callus lesion with no significant surrounding induration, warmth, tenderness, or concerning erythema. 1+ dorsalis pedis pulse  Assessment and plan:  # Nonhealing wound of left lower extremity Cover with Keflex, however I don't believe that there is any active infection Recommended routine wound care with Vaseline and simple bandage. They need to be referred back to wound care, however time will tell    Meds ordered this encounter  Medications  . cephALEXin (KEFLEX) 500 MG capsule    Sig: Take 1 capsule (500 mg total) by mouth 3 (three) times daily.    Dispense:  21 capsule    Refill:  [place], MD [facility] 10/08/2016, 9:35 AM

## 2016-10-19 ENCOUNTER — Ambulatory Visit (INDEPENDENT_AMBULATORY_CARE_PROVIDER_SITE_OTHER): Payer: Medicare Other | Admitting: *Deleted

## 2016-10-19 DIAGNOSIS — E538 Deficiency of other specified B group vitamins: Secondary | ICD-10-CM | POA: Diagnosis not present

## 2016-10-19 NOTE — Progress Notes (Signed)
Pt given Cyanocobalamin inj Tolerated well 

## 2016-11-20 ENCOUNTER — Ambulatory Visit (INDEPENDENT_AMBULATORY_CARE_PROVIDER_SITE_OTHER): Payer: Medicare Other | Admitting: *Deleted

## 2016-11-20 DIAGNOSIS — E538 Deficiency of other specified B group vitamins: Secondary | ICD-10-CM | POA: Diagnosis not present

## 2016-11-20 DIAGNOSIS — D508 Other iron deficiency anemias: Secondary | ICD-10-CM

## 2016-11-20 MED ORDER — CYANOCOBALAMIN 1000 MCG/ML IJ SOLN
1000.0000 ug | INTRAMUSCULAR | Status: AC
  Administered 2016-11-20 – 2020-08-18 (×40): 1000 ug via INTRAMUSCULAR

## 2016-11-20 NOTE — Progress Notes (Signed)
Pt given Cyanocobalamin inj Tolerated well 

## 2016-11-24 DIAGNOSIS — E119 Type 2 diabetes mellitus without complications: Secondary | ICD-10-CM | POA: Diagnosis not present

## 2016-11-24 DIAGNOSIS — J449 Chronic obstructive pulmonary disease, unspecified: Secondary | ICD-10-CM | POA: Diagnosis not present

## 2016-12-12 ENCOUNTER — Ambulatory Visit (INDEPENDENT_AMBULATORY_CARE_PROVIDER_SITE_OTHER): Payer: Medicare Other | Admitting: Family Medicine

## 2016-12-12 ENCOUNTER — Encounter: Payer: Self-pay | Admitting: Family Medicine

## 2016-12-12 VITALS — BP 155/84 | HR 65 | Temp 97.0°F | Ht 67.5 in | Wt 146.0 lb

## 2016-12-12 DIAGNOSIS — E538 Deficiency of other specified B group vitamins: Secondary | ICD-10-CM

## 2016-12-12 DIAGNOSIS — I739 Peripheral vascular disease, unspecified: Secondary | ICD-10-CM | POA: Diagnosis not present

## 2016-12-12 DIAGNOSIS — K219 Gastro-esophageal reflux disease without esophagitis: Secondary | ICD-10-CM

## 2016-12-12 DIAGNOSIS — E11610 Type 2 diabetes mellitus with diabetic neuropathic arthropathy: Secondary | ICD-10-CM

## 2016-12-12 DIAGNOSIS — Z23 Encounter for immunization: Secondary | ICD-10-CM | POA: Diagnosis not present

## 2016-12-12 DIAGNOSIS — E559 Vitamin D deficiency, unspecified: Secondary | ICD-10-CM

## 2016-12-12 DIAGNOSIS — I10 Essential (primary) hypertension: Secondary | ICD-10-CM | POA: Diagnosis not present

## 2016-12-12 DIAGNOSIS — J449 Chronic obstructive pulmonary disease, unspecified: Secondary | ICD-10-CM | POA: Diagnosis not present

## 2016-12-12 DIAGNOSIS — D509 Iron deficiency anemia, unspecified: Secondary | ICD-10-CM

## 2016-12-12 DIAGNOSIS — E7800 Pure hypercholesterolemia, unspecified: Secondary | ICD-10-CM | POA: Diagnosis not present

## 2016-12-12 LAB — BAYER DCA HB A1C WAIVED: HB A1C: 6.1 % (ref <7.0)

## 2016-12-12 NOTE — Patient Instructions (Addendum)
Medicare Annual Wellness Visit  [facility] and the medical providers at [facility] strive to bring you the best medical care.  In doing so we not only want to address your current medical conditions and concerns but also to detect new conditions early and prevent illness, disease and health-related problems.    Medicare offers a yearly Wellness Visit which allows our clinical staff to assess your need for preventative services including immunizations, lifestyle education, counseling to decrease risk of preventable diseases and screening for fall risk and other medical concerns.    This visit is provided free of charge (no copay) for all Medicare recipients. The clinical pharmacists at [facility] have begun to conduct these Wellness Visits which will also include a thorough review of all your medications.    As you primary medical provider recommend that you make an appointment for your Annual Wellness Visit if you have not done so already this year.  You may set up this appointment before you leave today or you may call back ([phone]) and schedule an appointment.  Please make sure when you call that you mention that you are scheduling your Annual Wellness Visit with the clinical pharmacist so that the appointment may be made for the proper length of time.     Continue current medications. Continue good therapeutic lifestyle changes which include good diet and exercise. Fall precautions discussed with patient. If an FOBT was given today- please return it to our front desk. If you are over 57 years old - you may need Prevnar 9 or the adult Pneumonia vaccine.  **Flu shots are available--- please call and schedule a FLU-CLINIC appointment**  After your visit with [place] today you will receive a survey in the mail or online from [employer] regarding your care with [place]. Please take a moment to fill this out. Your feedback is very  important to [place] as you can help [place] better understand your patient needs as well as improve your experience and satisfaction. WE CARE ABOUT YOU!!!  Continue to watch diet closely and monitor blood sugars regularly Watch sodium intake Drink plenty of fluids and stay well hydrated Use Eucerin or Moisturel for hydrating skin on both feet. Use Vaseline on the area of dry skin on the left foot with a smaller Band-Aid and let this remains open to the air at nighttime. Use soaps fabric softeners and detergents that are scent free

## 2016-12-12 NOTE — Progress Notes (Signed)
Subjective:    [name], female    DOB: 1931/07/06, 83 y.o.   MRN: [id number]  HPI Pt here for follow up and management of chronic medical problems which includes diabetes, hyperlipidemia and hypertension. She is taking medication regularly. The patient comes with her daughter to the visit today. Her only complaint is issues with her left foot and dry skin. She will get lab work today. Her initial blood pressure was elevated at 155/84. The patient is doing well overall. She denies any chest pain or any more shortness of breath than usual. She has no trouble with nausea vomiting or blood in the stool. She does have occasional bouts of loose bowel movements but this is normal for her. Her stools are dark because she is on chronic iron replacement. She is passing her water without problems. She says her blood sugars at home and been running in the 90 to the 100 range fasting.     Patient Active Problem List   Diagnosis Date Noted  . Atherosclerosis of native arteries of the extremities with ulceration ([place]) 05/21/2016  . Non-healing wound of lower extremity 05/21/2016  . Type 2 diabetes mellitus treated without insulin ([place]) 03/11/2015  . Peripheral vascular insufficiency ([place]) 09/21/2013  . Hip pain 09/21/2013  . Osteoporosis, senile 07/15/2013  . Vitamin D deficiency   . Anemia, iron deficiency 08/22/2010  . GERD (gastroesophageal reflux disease) 08/22/2010  . Guaiac positive stools 08/22/2010  . BOWEN'S DISEASE 12/24/2008  . Hypothyroidism 12/24/2008  . HEMORRHOIDS 12/24/2008  . RECTAL BLEEDING 12/24/2008  . ARTHRITIS 12/24/2008  . NEOPLASM, MALIGNANT, ANAL CANAL, SQUAMOUS CELL 12/23/2008  . Hyperlipidemia 12/23/2008  . CARDIOMYOPATHY 12/23/2008  . CHF 12/23/2008  . COPD (chronic obstructive pulmonary disease) with chronic bronchitis ([place]) 12/23/2008  . HIATAL HERNIA 12/23/2008  . DIVERTICULOSIS, COLON 12/23/2008  . RENAL CYST 12/23/2008   Outpatient  Encounter Prescriptions as of 12/12/2016  Medication Sig  . acetaminophen (TYLENOL) 325 MG tablet Take 650 mg by mouth every 6 (six) hours as needed (for pain).   Marland Kitchen albuterol (PROVENTIL HFA;VENTOLIN HFA) 108 (90 BASE) MCG/ACT inhaler Inhale 1 puff into the lungs every 4 (four) hours as needed for wheezing.   Marland Kitchen albuterol (PROVENTIL) (2.5 MG/3ML) 0.083% nebulizer solution USE ONE VIAL IN NEBULIZER EVERY 6 HOURS AS NEEDED  . aspirin EC 81 MG tablet Take 81 mg by mouth daily.  . budesonide (PULMICORT) 0.5 MG/2ML nebulizer solution USE ONE VIAL IN NEBULIZER TWICE DAILY  . CALCIUM PO Take 1 tablet by mouth every evening.  . Cholecalciferol (VITAMIN D3) 1000 UNITS tablet Take 2,000 Units by mouth every evening.   . ferrous sulfate 325 (65 FE) MG tablet Take 325 mg by mouth every Monday, Wednesday, and Friday.   Marland Kitchen glucose blood (ONETOUCH VERIO) test strip USE TO CHECK GLUCOSE ONCE DAILY  . hydrochlorothiazide (HYDRODIURIL) 25 MG tablet TAKE ONE TABLET BY MOUTH ONCE DAILY  . hydroxypropyl methylcellulose / hypromellose (ISOPTO TEARS / GONIOVISC) 2.5 % ophthalmic solution Place 1 drop into both eyes 4 (four) times daily.  Marland Kitchen ketotifen (ALAWAY) 0.025 % ophthalmic solution Place 1 drop into both eyes 4 (four) times daily.  Marland Kitchen levothyroxine (SYNTHROID, LEVOTHROID) 100 MCG tablet Take 100 mcg by mouth daily before breakfast.  . meclizine (ANTIVERT) 25 MG tablet Take 25 mg by mouth 2 (two) times daily.  . Misc. Devices (HUGO ROLLING WALKER ELITE) MISC Use daily for gait instability  . omega-3 acid ethyl esters (LOVAZA) 1 G capsule  Take 1 g by mouth daily.   Glory Rosebush DELICA LANCETS FINE MISC Use  To check blood glucose once a day.  DX:  Type 2 DM E11.9  . ONETOUCH VERIO test strip USE ONE STRIP TO CHECK GLUCOSE ONCE DAILY  . Pitavastatin Calcium (LIVALO) 2 MG TABS Take 2 mg by mouth every evening.  . [DISCONTINUED] cephALEXin (KEFLEX) 500 MG capsule Take 1 capsule (500 mg total) by mouth 3 (three) times daily.     Facility-Administered Encounter Medications as of 12/12/2016  Medication  . cyanocobalamin ((VITAMIN B-12)) injection 1,000 mcg     Review of Systems  Constitutional: Negative.   HENT: Negative.   Eyes: Negative.   Respiratory: Negative.   Cardiovascular: Negative.   Gastrointestinal: Negative.   Endocrine: Negative.   Genitourinary: Negative.   Musculoskeletal: Negative.   Skin: Negative.        Left foot - dry skin  Allergic/Immunologic: Negative.   Neurological: Negative.   Hematological: Negative.   Psychiatric/Behavioral: Negative.        Objective:   Physical Exam  Constitutional: She is oriented to person, place, and time. She appears well-developed and well-nourished. No distress.  Elderly but alert Patient is doing well today and in good spirits  HENT:  Head: Normocephalic and atraumatic.  Right Ear: External ear normal.  Left Ear: External ear normal.  Nose: Nose normal.  Mouth/Throat: Oropharynx is clear and moist.  Eyes: Pupils are equal, round, and reactive to light. Conjunctivae and EOM are normal. Right eye exhibits no discharge. Left eye exhibits no discharge. No scleral icterus.  Neck: Normal range of motion. Neck supple. No thyromegaly present.  No bruits thyromegaly or anterior cervical adenopathy  Cardiovascular: Normal rate, regular rhythm and normal heart sounds.   No murmur heard. Pulses were difficult to palpate in the left lower extremity but were present in the right lower extremity. The heart is regular at 72/m  Pulmonary/Chest: Effort normal. No respiratory distress. She has no wheezes. She has no rales.  Decreased breath sounds bilaterally but no wheezes rales or rhonchi.  Abdominal: Soft. Bowel sounds are normal. She exhibits no mass. There is no tenderness. There is no rebound and no guarding.  No abdominal tenderness masses bruits organ enlargement or inguinal adenopathy  Musculoskeletal: Normal range of motion. She exhibits no edema.   Lymphadenopathy:    She has no cervical adenopathy.  Neurological: She is alert and oriented to person, place, and time. She has normal reflexes. No cranial nerve deficit.  Skin: Skin is warm and dry. No rash noted.  Psychiatric: She has a normal mood and affect. Her behavior is normal. Judgment and thought content normal.  Nursing note and vitals reviewed.  BP (!) 155/84 (BP Location: Left Arm)   Pulse 65   Temp (!) 97 F (36.1 C) (Oral)   Ht 5' 7.5" (1.715 m)   Wt 146 lb (66.2 kg)   BMI 22.53 kg/m         Assessment & Plan:  1. Iron deficiency anemia, unspecified iron deficiency anemia type -Continue with iron replacement pending results of lab work - CBC with Differential/Platelet  2. Essential hypertension -Watch sodium intake and continue to monitor blood pressures if possible at home by daughter - CBC with Differential/Platelet - BMP8+EGFR - Hepatic function panel  3. Vitamin D deficiency -Continue vitamin D replacement pending results of lab work - CBC with Differential/Platelet - VITAMIN D 25 Hydroxy (Vit-D Deficiency, Fractures)  4. Pure hypercholesterolemia -Continue with as  aggressive therapeutic lifestyle changes as possible which include diet and exercise and current treatment pending results of lab work - CBC with Differential/Platelet - Lipid panel  5. Type 2 diabetes mellitus with diabetic neuropathic arthropathy, without long-term current use of insulin (HCC) -Continue to monitor blood sugars regularly and watch diet as closely as possible - CBC with Differential/Platelet - Bayer DCA Hb A1c Waived  6. Gastroesophageal reflux disease, esophagitis presence not specified --Continue with when necessary use of ranitidine or omeprazole as needed for stomach and watching diet closely. - CBC with Differential/Platelet - Hepatic function panel  7. Vitamin B 12 deficiency -Continue with B12 injections  8. Peripheral vascular insufficiency (HCC) -Walk  and exercise as much as possible  9. Chronic obstructive pulmonary disease, unspecified COPD type ([place]) -Continue with inhalers and healthy airway protection  Patient Instructions                       Medicare Annual Wellness Visit  [facility] and the medical providers at [facility] strive to bring you the best medical care.  In doing so we not only want to address your current medical conditions and concerns but also to detect new conditions early and prevent illness, disease and health-related problems.    Medicare offers a yearly Wellness Visit which allows our clinical staff to assess your need for preventative services including immunizations, lifestyle education, counseling to decrease risk of preventable diseases and screening for fall risk and other medical concerns.    This visit is provided free of charge (no copay) for all Medicare recipients. The clinical pharmacists at [facility] have begun to conduct these Wellness Visits which will also include a thorough review of all your medications.    As you primary medical provider recommend that you make an appointment for your Annual Wellness Visit if you have not done so already this year.  You may set up this appointment before you leave today or you may call back ([phone]) and schedule an appointment.  Please make sure when you call that you mention that you are scheduling your Annual Wellness Visit with the clinical pharmacist so that the appointment may be made for the proper length of time.     Continue current medications. Continue good therapeutic lifestyle changes which include good diet and exercise. Fall precautions discussed with patient. If an FOBT was given today- please return it to our front desk. If you are over 72 years old - you may need Prevnar 40 or the adult Pneumonia vaccine.  **Flu shots are available--- please call and schedule a FLU-CLINIC appointment**  After  your visit with [place] today you will receive a survey in the mail or online from [employer] regarding your care with [place]. Please take a moment to fill this out. Your feedback is very important to [place] as you can help [place] better understand your patient needs as well as improve your experience and satisfaction. WE CARE ABOUT YOU!!!  Continue to watch diet closely and monitor blood sugars regularly Watch sodium intake Drink plenty of fluids and stay well hydrated Use Eucerin or Moisturel for hydrating skin on both feet. Use Vaseline on the area of dry skin on the left foot with a smaller Band-Aid and let this remains open to the air at nighttime. Use soaps fabric softeners and detergents that are scent free   Arrie Senate MD

## 2016-12-13 LAB — CBC WITH DIFFERENTIAL/PLATELET
BASOS: 1 %
Basophils Absolute: 0.1 10*3/uL (ref 0.0–0.2)
EOS (ABSOLUTE): 0.3 10*3/uL (ref 0.0–0.4)
Eos: 3 %
Hematocrit: 44.8 % (ref 34.0–46.6)
Hemoglobin: 14.7 g/dL (ref 11.1–15.9)
IMMATURE GRANS (ABS): 0 10*3/uL (ref 0.0–0.1)
IMMATURE GRANULOCYTES: 0 %
LYMPHS: 29 %
Lymphocytes Absolute: 2.6 10*3/uL (ref 0.7–3.1)
MCH: 28.1 pg (ref 26.6–33.0)
MCHC: 32.8 g/dL (ref 31.5–35.7)
MCV: 86 fL (ref 79–97)
MONOCYTES: 5 %
Monocytes Absolute: 0.5 10*3/uL (ref 0.1–0.9)
NEUTROS PCT: 62 %
Neutrophils Absolute: 5.5 10*3/uL (ref 1.4–7.0)
PLATELETS: 188 10*3/uL (ref 150–379)
RBC: 5.23 x10E6/uL (ref 3.77–5.28)
RDW: 14.9 % (ref 12.3–15.4)
WBC: 8.9 10*3/uL (ref 3.4–10.8)

## 2016-12-13 LAB — LIPID PANEL
CHOLESTEROL TOTAL: 158 mg/dL (ref 100–199)
Chol/HDL Ratio: 3.3 ratio (ref 0.0–4.4)
HDL: 48 mg/dL (ref >39)
LDL Calculated: 83 mg/dL (ref 0–99)
Triglycerides: 133 mg/dL (ref 0–149)
VLDL CHOLESTEROL CAL: 27 mg/dL (ref 5–40)

## 2016-12-13 LAB — HEPATIC FUNCTION PANEL
ALBUMIN: 4.4 g/dL (ref 3.5–4.7)
ALK PHOS: 42 IU/L (ref 39–117)
ALT: 9 IU/L (ref 0–32)
AST: 15 IU/L (ref 0–40)
BILIRUBIN TOTAL: 0.3 mg/dL (ref 0.0–1.2)
BILIRUBIN, DIRECT: 0.1 mg/dL (ref 0.00–0.40)
TOTAL PROTEIN: 6.7 g/dL (ref 6.0–8.5)

## 2016-12-13 LAB — BMP8+EGFR
BUN / CREAT RATIO: 27 (ref 12–28)
BUN: 21 mg/dL (ref 8–27)
CHLORIDE: 97 mmol/L (ref 96–106)
CO2: 31 mmol/L — ABNORMAL HIGH (ref 20–29)
Calcium: 9.7 mg/dL (ref 8.7–10.3)
Creatinine, Ser: 0.77 mg/dL (ref 0.57–1.00)
GFR calc Af Amer: 83 mL/min/{1.73_m2} (ref >59)
GFR calc non Af Amer: 72 mL/min/{1.73_m2} (ref >59)
GLUCOSE: 95 mg/dL (ref 65–99)
POTASSIUM: 3.6 mmol/L (ref 3.5–5.2)
SODIUM: 144 mmol/L (ref 134–144)

## 2016-12-13 LAB — VITAMIN D 25 HYDROXY (VIT D DEFICIENCY, FRACTURES): Vit D, 25-Hydroxy: 67.9 ng/mL (ref 30.0–100.0)

## 2016-12-21 ENCOUNTER — Ambulatory Visit (INDEPENDENT_AMBULATORY_CARE_PROVIDER_SITE_OTHER): Payer: Medicare Other | Admitting: *Deleted

## 2016-12-21 DIAGNOSIS — D508 Other iron deficiency anemias: Secondary | ICD-10-CM

## 2016-12-21 DIAGNOSIS — E538 Deficiency of other specified B group vitamins: Secondary | ICD-10-CM

## 2016-12-21 NOTE — Progress Notes (Signed)
Pt given Cyanocobalamin inj Tolerated well 

## 2017-01-02 ENCOUNTER — Other Ambulatory Visit: Payer: Self-pay | Admitting: Family Medicine

## 2017-01-22 ENCOUNTER — Ambulatory Visit: Payer: Medicare Other

## 2017-01-23 ENCOUNTER — Ambulatory Visit (INDEPENDENT_AMBULATORY_CARE_PROVIDER_SITE_OTHER): Payer: Medicare Other | Admitting: *Deleted

## 2017-01-23 DIAGNOSIS — E538 Deficiency of other specified B group vitamins: Secondary | ICD-10-CM | POA: Diagnosis not present

## 2017-01-23 DIAGNOSIS — D508 Other iron deficiency anemias: Secondary | ICD-10-CM | POA: Diagnosis not present

## 2017-01-23 NOTE — Progress Notes (Signed)
Pt given Cyanacobalamin inj Tolerated well

## 2017-01-26 DIAGNOSIS — H10013 Acute follicular conjunctivitis, bilateral: Secondary | ICD-10-CM | POA: Diagnosis not present

## 2017-02-13 DIAGNOSIS — J449 Chronic obstructive pulmonary disease, unspecified: Secondary | ICD-10-CM | POA: Diagnosis not present

## 2017-02-20 ENCOUNTER — Other Ambulatory Visit: Payer: Self-pay | Admitting: *Deleted

## 2017-02-20 DIAGNOSIS — E119 Type 2 diabetes mellitus without complications: Secondary | ICD-10-CM | POA: Diagnosis not present

## 2017-02-20 MED ORDER — GLUCOSE BLOOD VI STRP: ORAL_STRIP | 9 refills | Status: DC

## 2017-02-25 ENCOUNTER — Ambulatory Visit: Payer: Medicare Other

## 2017-03-04 ENCOUNTER — Ambulatory Visit (INDEPENDENT_AMBULATORY_CARE_PROVIDER_SITE_OTHER): Payer: Medicare Other | Admitting: *Deleted

## 2017-03-04 DIAGNOSIS — D508 Other iron deficiency anemias: Secondary | ICD-10-CM | POA: Diagnosis not present

## 2017-03-04 DIAGNOSIS — E538 Deficiency of other specified B group vitamins: Secondary | ICD-10-CM

## 2017-03-04 NOTE — Progress Notes (Signed)
Pt given Cyanocobalamin inj Tolerated well 

## 2017-03-07 ENCOUNTER — Other Ambulatory Visit: Payer: Self-pay

## 2017-03-07 DIAGNOSIS — I739 Peripheral vascular disease, unspecified: Secondary | ICD-10-CM

## 2017-03-07 DIAGNOSIS — S81802D Unspecified open wound, left lower leg, subsequent encounter: Secondary | ICD-10-CM

## 2017-03-08 ENCOUNTER — Encounter: Payer: Self-pay | Admitting: Family Medicine

## 2017-03-08 ENCOUNTER — Ambulatory Visit: Payer: Medicare Other | Admitting: Family Medicine

## 2017-03-08 VITALS — BP 124/66 | HR 97 | Temp 97.9°F | Ht 67.5 in | Wt 145.0 lb

## 2017-03-08 DIAGNOSIS — J441 Chronic obstructive pulmonary disease with (acute) exacerbation: Secondary | ICD-10-CM

## 2017-03-08 MED ORDER — AZITHROMYCIN 250 MG PO TABS: ORAL_TABLET | ORAL | 0 refills | Status: DC

## 2017-03-08 MED ORDER — PREDNISONE 20 MG PO TABS: ORAL_TABLET | ORAL | 0 refills | Status: DC

## 2017-03-08 NOTE — Progress Notes (Signed)
BP 124/66   Pulse 97   Temp 97.9 F (36.6 C) (Oral)   Ht 5' 7.5" (1.715 m)   Wt 145 lb (65.8 kg)   BMI 22.38 kg/m    Subjective:    [name], female    DOB: [birth year], 83 y.o.   MRN: [id number]  [name] is a 83 y.o. female presenting on 03/08/2017 for Nasal Congestion; Cough; and sores in nose   HPI Cough and chest congestion and wheezing Patient has been having cough and chest congestion and wheezing that started just this morning.  She says she typically gets like this every year and comes in quickly before it turns into a pneumonia.  She does have COPD and knows that it is likely related to that and she is on inhaled inhalers for it.  She denies any fevers or chills but does have some achiness and sinus pressure and ear pressure as well.  She is also been having nasal sores just inside the opening that she has been treating with a topical antibiotic for 3 weeks.  She says they are getting better.  Relevant past medical, surgical, family and social history reviewed and updated as indicated. Interim medical history since our last visit reviewed. Allergies and medications reviewed and updated.  Review of Systems  Constitutional: Negative for chills and fever.  HENT: Positive for congestion, postnasal drip, rhinorrhea, sinus pressure, sneezing and sore throat. Negative for ear discharge and ear pain.   Eyes: Negative for pain, redness and visual disturbance.  Respiratory: Positive for cough and wheezing. Negative for chest tightness and shortness of breath.   Cardiovascular: Negative for chest pain and leg swelling.  Genitourinary: Negative for difficulty urinating and dysuria.  Musculoskeletal: Negative for back pain and gait problem.  Skin: Negative for rash.  Neurological: Negative for light-headedness and headaches.  Psychiatric/Behavioral: Negative for agitation and behavioral problems.  All other systems reviewed and are negative.   Per  HPI unless specifically indicated above        Objective:    BP 124/66   Pulse 97   Temp 97.9 F (36.6 C) (Oral)   Ht 5' 7.5" (1.715 m)   Wt 145 lb (65.8 kg)   BMI 22.38 kg/m   Wt Readings from Last 3 Encounters:  03/08/17 145 lb (65.8 kg)  12/12/16 146 lb (66.2 kg)  10/08/16 150 lb 6.4 oz (68.2 kg)    Physical Exam  Constitutional: She is oriented to person, place, and time. She appears well-developed and well-nourished. No distress.  HENT:  Right Ear: Tympanic membrane, external ear and ear canal normal.  Left Ear: Tympanic membrane, external ear and ear canal normal.  Nose: Mucosal edema and rhinorrhea (Small nasal sore in left nare consistent with nasal folliculitis) present. No epistaxis. Right sinus exhibits no maxillary sinus tenderness and no frontal sinus tenderness. Left sinus exhibits no maxillary sinus tenderness and no frontal sinus tenderness.  Mouth/Throat: Uvula is midline and mucous membranes are normal. Posterior oropharyngeal edema and posterior oropharyngeal erythema present. No oropharyngeal exudate or tonsillar abscesses.  Eyes: Conjunctivae and EOM are normal.  Neck: Neck supple. No thyromegaly present.  Cardiovascular: Normal rate, regular rhythm, normal heart sounds and intact distal pulses.  No murmur heard. Pulmonary/Chest: Effort normal and breath sounds normal. No respiratory distress. She has no wheezes. She has no rales.  Musculoskeletal: Normal range of motion. She exhibits no edema or tenderness.  Lymphadenopathy:    She has no cervical  adenopathy.  Neurological: She is alert and oriented to person, place, and time. Coordination normal.  Skin: Skin is warm and dry. No rash noted. She is not diaphoretic.  Psychiatric: She has a normal mood and affect. Her behavior is normal.  Vitals reviewed.       Assessment & Plan:   Problem List Items Addressed This Visit    None    Visit Diagnoses    COPD with acute exacerbation ([place])    -  Primary    Relevant Medications   predniSONE (DELTASONE) 20 MG tablet   azithromycin (ZITHROMAX) 250 MG tablet      Continue with topical treatment for nasal sore.  Will send prednisone and a Zithromax for COPD  Follow up plan: Return if symptoms worsen or fail to improve.  Counseling provided for all of the vaccine components No orders of the defined types were placed in this encounter.   Caryl Pina, MD [facility] 03/08/2017, 10:24 AM

## 2017-04-05 ENCOUNTER — Ambulatory Visit (INDEPENDENT_AMBULATORY_CARE_PROVIDER_SITE_OTHER): Payer: Medicare Other | Admitting: *Deleted

## 2017-04-05 DIAGNOSIS — E538 Deficiency of other specified B group vitamins: Secondary | ICD-10-CM

## 2017-04-05 DIAGNOSIS — D519 Vitamin B12 deficiency anemia, unspecified: Secondary | ICD-10-CM

## 2017-04-05 NOTE — Progress Notes (Signed)
Pt given Cyanocobalamin inj Tolerated well 

## 2017-04-08 ENCOUNTER — Ambulatory Visit: Payer: Medicare Other | Admitting: Family

## 2017-04-08 ENCOUNTER — Ambulatory Visit (INDEPENDENT_AMBULATORY_CARE_PROVIDER_SITE_OTHER)
Admit: 2017-04-08 | Discharge: 2017-04-08 | Disposition: A | Discharge status: Home / Self Care | Payer: Medicare Other | Attending: Family | Admitting: Family

## 2017-04-08 ENCOUNTER — Ambulatory Visit (HOSPITAL_COMMUNITY)
Admit: 2017-04-08 | Discharge: 2017-04-08 | Disposition: A | Discharge status: Home / Self Care | Payer: Medicare Other | Source: Ambulatory Visit | Attending: Family | Admitting: Family

## 2017-04-08 ENCOUNTER — Encounter: Payer: Self-pay | Admitting: Family

## 2017-04-08 VITALS — BP 140/51 | HR 70 | Temp 96.7°F | Resp 17 | Wt 143.0 lb

## 2017-04-08 DIAGNOSIS — I7025 Atherosclerosis of native arteries of other extremities with ulceration: Secondary | ICD-10-CM

## 2017-04-08 DIAGNOSIS — S81802D Unspecified open wound, left lower leg, subsequent encounter: Secondary | ICD-10-CM

## 2017-04-08 DIAGNOSIS — F17200 Nicotine dependence, unspecified, uncomplicated: Secondary | ICD-10-CM | POA: Diagnosis not present

## 2017-04-08 DIAGNOSIS — Z95828 Presence of other vascular implants and grafts: Secondary | ICD-10-CM

## 2017-04-08 DIAGNOSIS — I739 Peripheral vascular disease, unspecified: Secondary | ICD-10-CM | POA: Diagnosis not present

## 2017-04-08 DIAGNOSIS — I745 Embolism and thrombosis of iliac artery: Secondary | ICD-10-CM

## 2017-04-08 DIAGNOSIS — E785 Hyperlipidemia, unspecified: Secondary | ICD-10-CM | POA: Insufficient documentation

## 2017-04-08 DIAGNOSIS — Z95820 Peripheral vascular angioplasty status with implants and grafts: Secondary | ICD-10-CM | POA: Insufficient documentation

## 2017-04-08 DIAGNOSIS — F17210 Nicotine dependence, cigarettes, uncomplicated: Secondary | ICD-10-CM | POA: Diagnosis not present

## 2017-04-08 DIAGNOSIS — X58XXXD Exposure to other specified factors, subsequent encounter: Secondary | ICD-10-CM | POA: Diagnosis not present

## 2017-04-08 NOTE — Patient Instructions (Signed)
Steps to Quit Smoking Smoking tobacco can be bad for your health. It can also affect almost every organ in your body. Smoking puts you and people around you at risk for many serious long-lasting (chronic) diseases. Quitting smoking is hard, but it is one of the best things that you can do for your health. It is never too late to quit. What are the benefits of quitting smoking? When you quit smoking, you lower your risk for getting serious diseases and conditions. They can include:  Lung cancer or lung disease.  Heart disease.  Stroke.  Heart attack.  Not being able to have children (infertility).  Weak bones (osteoporosis) and broken bones (fractures).  If you have coughing, wheezing, and shortness of breath, those symptoms may get better when you quit. You may also get sick less often. If you are pregnant, quitting smoking can help to lower your chances of having a baby of low birth weight. What can I do to help me quit smoking? Talk with your doctor about what can help you quit smoking. Some things you can do (strategies) include:  Quitting smoking totally, instead of slowly cutting back how much you smoke over a period of time.  Going to in-person counseling. You are more likely to quit if you go to many counseling sessions.  Using resources and support systems, such as: ? Online chats with a counselor. ? Phone quitlines. ? Printed self-help materials. ? Support groups or group counseling. ? Text messaging programs. ? Mobile phone apps or applications.  Taking medicines. Some of these medicines may have nicotine in them. If you are pregnant or breastfeeding, do not take any medicines to quit smoking unless your doctor says it is okay. Talk with your doctor about counseling or other things that can help you.  Talk with your doctor about using more than one strategy at the same time, such as taking medicines while you are also going to in-person counseling. This can help make  quitting easier. What things can I do to make it easier to quit? Quitting smoking might feel very hard at first, but there is a lot that you can do to make it easier. Take these steps:  Talk to your family and friends. Ask them to support and encourage you.  Call phone quitlines, reach out to support groups, or work with a counselor.  Ask people who smoke to not smoke around you.  Avoid places that make you want (trigger) to smoke, such as: ? Bars. ? Parties. ? Smoke-break areas at work.  Spend time with people who do not smoke.  Lower the stress in your life. Stress can make you want to smoke. Try these things to help your stress: ? Getting regular exercise. ? Deep-breathing exercises. ? Yoga. ? Meditating. ? Doing a body scan. To do this, close your eyes, focus on one area of your body at a time from head to toe, and notice which parts of your body are tense. Try to relax the muscles in those areas.  Download or buy apps on your mobile phone or tablet that can help you stick to your quit plan. There are many free apps, such as QuitGuide from the CDC (Centers for Disease Control and Prevention). You can find more support from smokefree.gov and other websites.  This information is not intended to replace advice given to you by your health care provider. Make sure you discuss any questions you have with your health care provider. Document Released: 12/30/2008 Document   Revised: 11/01/2015 Document Reviewed: 07/20/2014 Elsevier Interactive Patient Education  2018 Elsevier Inc.     Peripheral Vascular Disease Peripheral vascular disease (PVD) is a disease of the blood vessels that are not part of your heart and brain. A simple term for PVD is poor circulation. In most cases, PVD narrows the blood vessels that carry blood from your heart to the rest of your body. This can result in a decreased supply of blood to your arms, legs, and internal organs, like your stomach or kidneys.  However, it most often affects a person's lower legs and feet. There are two types of PVD.  Organic PVD. This is the more common type. It is caused by damage to the structure of blood vessels.  Functional PVD. This is caused by conditions that make blood vessels contract and tighten (spasm).  Without treatment, PVD tends to get worse over time. PVD can also lead to acute ischemic limb. This is when an arm or limb suddenly has trouble getting enough blood. This is a medical emergency. Follow these instructions at home:  Take medicines only as told by your doctor.  Do not use any tobacco products, including cigarettes, chewing tobacco, or electronic cigarettes. If you need help quitting, ask your doctor.  Lose weight if you are overweight, and maintain a healthy weight as told by your doctor.  Eat a diet that is low in fat and cholesterol. If you need help, ask your doctor.  Exercise regularly. Ask your doctor for some good activities for you.  Take good care of your feet. ? Wear comfortable shoes that fit well. ? Check your feet often for any cuts or sores. Contact a doctor if:  You have cramps in your legs while walking.  You have leg pain when you are at rest.  You have coldness in a leg or foot.  Your skin changes.  You are unable to get or have an erection (erectile dysfunction).  You have cuts or sores on your feet that are not healing. Get help right away if:  Your arm or leg turns cold and blue.  Your arms or legs become red, warm, swollen, painful, or numb.  You have chest pain or trouble breathing.  You suddenly have weakness in your face, arm, or leg.  You become very confused or you cannot speak.  You suddenly have a very bad headache.  You suddenly cannot see. This information is not intended to replace advice given to you by your health care provider. Make sure you discuss any questions you have with your health care provider. Document Released:  05/30/2009 Document Revised: 08/11/2015 Document Reviewed: 08/13/2013 Elsevier Interactive Patient Education  2017 Elsevier Inc.  

## 2017-04-08 NOTE — Progress Notes (Signed)
[facility]   CC: Follow up peripheral artery occlusive disease  History of Present Illness Katie Wilkins is a 84 y.o. female who is s/p angioplasty of left common iliac artery for in stent stenosis on 09-11-16 by Dr. Trula Slade.  Findings:              Aortogram:  No significant renal artery stenosis.  The infrarenal down aorta is widely patent.  The right common and external iliac artery are widely patent.  There is a stent within the left common iliac artery.  The left external iliac artery is patent without stenosis.  Pullback pressure across the aorta and left common iliac stent was approximately 30 mm.             Right Lower Extremity:  The right common femoral and profunda femoral artery are patent throughout her course.  The superficial femoral artery is patent throughout it's course but small in caliber.  There is a 70% stenosis within the popliteal artery.  There is two-vessel runoff via the anterior tibial and peroneal artery             Left Lower Extremity:  The left common femoral and profunda femoral artery are patent throughout their course.  The superficial femoral artery is occluded.  There is reconstitution of the popliteal artery.  There is two-vessel runoff via the anterior tibial peroneal artery  The patient has a history of an occluded left common iliac artery.  This was recanalized and stented with a 7 mm Bard covered stent by Dr. Oneida Alar on 10-03-15.  She was able to heal all of her wounds.  She then had in-stent stenosis on ultrasound and required further evaluation.  She returns today for follow up.  Pt states she does not walk much, and has no barriers to walking, she has no claudication sx's with walking.  Pt states her blood pressure usually low, systolic in the 92'J per pt.   Pt denies any known hx of stroke or TIA. States she has CHF, no known MI. She no longer has a cardiologist. Pt daughter states her PCP requests EKG's for pt.   She  denies any tingling, numbness, pain, cold sensation, or weakness in left upper extremity.     Diabetic: Yes, last A1C result on file was 6.1 on 12-12-16.  Tobacco use: smoker  (1 ppd, started at age 26 yrs)  Pt meds include: Statin :Yes Betablocker: No ASA: Yes Other anticoagulants/antiplatelets: no  Past Medical History:  Diagnosis Date  . Abdominal bloating   . anemia iron deficiency   . Arthritis   . Bowen's disease   . Cardiomyopathy   . Cataract   . CHF (congestive heart failure) ([place])   . COPD (chronic obstructive pulmonary disease) ([place])   . Diabetes mellitus without complication ([place])   . Diarrhea   . Diverticulosis   . Heme + stool   . Hemorrhoids   . Hiatal hernia   . Hyperlipemia   . Mitral valve regurgitation   . Neoplasm    Malignant, anal canal, squamous cell  . Nodular goiter   . Osteoporosis   . Rectal bleeding   . Rectocele   . Renal cyst   . Vitamin D deficiency     Social History Social History   Tobacco Use  . Smoking status: Current Every Day Smoker    Packs/day: 0.50    Years: 57.00    Pack years: 28.50    Types:  Cigarettes    Start date: 03/19/1951  . Smokeless tobacco: Never Used  . Tobacco comment: Less than 1 pk per day  Substance Use Topics  . Alcohol use: No  . Drug use: No    Family History Family History  Problem Relation Age of Onset  . Diabetes Mother   . Alzheimer's disease Mother   . Diabetes Brother   . Alzheimer's disease Brother   . Colon cancer Neg Hx     Past Surgical History:  Procedure Laterality Date  . ABDOMINAL AORTOGRAM W/LOWER EXTREMITY N/A 09/11/2016   Procedure: Abdominal Aortogram w/Lower Extremity;  Surgeon: Serafina Mitchell, MD;  Location: [facility];  Service: Cardiovascular;  Laterality: N/A;  . ABDOMINAL HYSTERECTOMY     vaginal secondary to fibroids  . COLON SURGERY     excision of cancer  . EYE SURGERY    . HEMORRHOID SURGERY    . NASAL SEPTUM SURGERY    . PERIPHERAL VASCULAR  BALLOON ANGIOPLASTY Left 09/11/2016   Procedure: Peripheral Vascular Balloon Angioplasty;  Surgeon: Serafina Mitchell, MD;  Location: [facility];  Service: Cardiovascular;  Laterality: Left;  COMMON ILIAC  . PERIPHERAL VASCULAR CATHETERIZATION N/A 09/23/2015   Procedure: Abdominal Aortogram;  Surgeon: Elam Dutch, MD;  Location: [facility];  Service: Cardiovascular;  Laterality: N/A;  . PERIPHERAL VASCULAR CATHETERIZATION Bilateral 09/23/2015   Procedure: Lower Extremity Angiography;  Surgeon: Elam Dutch, MD;  Location: [facility];  Service: Cardiovascular;  Laterality: Bilateral;  . PERIPHERAL VASCULAR CATHETERIZATION Left 09/23/2015   Procedure: Peripheral Vascular Intervention;  Surgeon: Elam Dutch, MD;  Location: [facility];  Service: Cardiovascular;  Laterality: Left;  common iliac stent  . SQUAMOUS CELL CARCINOMA EXCISION  1985   anus resected    Allergies  Allergen Reactions  . Ace Inhibitors Cough  . Codeine Other (See Comments)    Sleepy and crazy    Current Outpatient Medications  Medication Sig Dispense Refill  . acetaminophen (TYLENOL) 325 MG tablet Take 650 mg by mouth every 6 (six) hours as needed (for pain).     Marland Kitchen albuterol (PROVENTIL HFA;VENTOLIN HFA) 108 (90 BASE) MCG/ACT inhaler Inhale 1 puff into the lungs every 4 (four) hours as needed for wheezing.     Marland Kitchen albuterol (PROVENTIL) (2.5 MG/3ML) 0.083% nebulizer solution USE ONE VIAL IN NEBULIZER EVERY 6 HOURS AS NEEDED 225 mL 5  . aspirin EC 81 MG tablet Take 81 mg by mouth daily.    . budesonide (PULMICORT) 0.5 MG/2ML nebulizer solution USE ONE VIAL IN NEBULIZER TWICE DAILY 120 mL 5  . CALCIUM PO Take 1 tablet by mouth every evening.    . Cholecalciferol (VITAMIN D3) 1000 UNITS tablet Take 2,000 Units by mouth every evening.     . ferrous sulfate 325 (65 FE) MG tablet Take 325 mg by mouth every Monday, Wednesday, and Friday.     Marland Kitchen glucose blood (ONETOUCH VERIO) test strip USE TO  CHECK GLUCOSE ONCE DAILY 100 each 9  . hydrochlorothiazide (HYDRODIURIL) 25 MG tablet TAKE 1 TABLET BY MOUTH ONCE DAILY 90 tablet 0  . hydroxypropyl methylcellulose / hypromellose (ISOPTO TEARS / GONIOVISC) 2.5 % ophthalmic solution Place 1 drop into both eyes 4 (four) times daily.    Marland Kitchen ketotifen (ALAWAY) 0.025 % ophthalmic solution Place 1 drop into both eyes 4 (four) times daily.    Marland Kitchen levothyroxine (SYNTHROID, LEVOTHROID) 100 MCG tablet Take 100 mcg by mouth daily before breakfast.    .  meclizine (ANTIVERT) 25 MG tablet Take 25 mg by mouth 2 (two) times daily.    . Misc. Devices (HUGO ROLLING WALKER ELITE) MISC Use daily for gait instability 1 each 0  . omega-3 acid ethyl esters (LOVAZA) 1 G capsule Take 1 g by mouth daily.     Glory Rosebush DELICA LANCETS FINE MISC Use  To check blood glucose once a day.  DX:  Type 2 DM E11.9 100 each 1  . Pitavastatin Calcium (LIVALO) 2 MG TABS Take 2 mg by mouth every evening.     Current Facility-Administered Medications  Medication Dose Route Frequency Provider Last Rate Last Dose  . cyanocobalamin ((VITAMIN B-12)) injection 1,000 mcg  1,000 mcg Intramuscular Q30 days Chipper Herb, MD   1,000 mcg at 04/05/17 1001    ROS: See HPI for pertinent positives and negatives.   Physical Examination  Vitals:   04/08/17 0959  BP: (!) 140/51  Pulse: 70  Resp: 17  Temp: (!) 96.7 F (35.9 C)  TempSrc: Oral  SpO2: 95%  Weight: 143 lb (64.9 kg)   Body mass index is 22.07 kg/m.  General: A&O x 3, WDWN, elderly female. Gait: normal HENT: No gross abnormalities.  Eyes: PERRLA. Pulmonary: Respirations are non labored, CTAB, good air movement in all fields Cardiac: regular rhythm, no detected murmur.         Carotid Bruits Right Left   Negative Negative   Radial pulses: 2+ palpable right, not palpable left, left brachial pulse also not palpable.  Adominal aortic pulse is not palpable                         VASCULAR EXAM: Extremities without  ischemic changes,  without Gangrene; without open wounds.                                                                                                          LE Pulses Right Left       FEMORAL  2+ palpable  2+ palpable        POPLITEAL  not palpable   not palpable       POSTERIOR TIBIAL  not palpable   not palpable        DORSALIS PEDIS      ANTERIOR TIBIAL faintly palpable  not palpable    Abdomen: soft, NT, no palpable masses. Skin: no rashes, no cellulitis, no ulcers noted. Musculoskeletal: no muscle wasting or atrophy.  Neurologic: A&O X 3; appropriate affect, Sensation is normal; MOTOR FUNCTION:  moving all extremities equally, motor strength 5/5 throughout. Speech is fluent/normal. CN 2-12 intact. Psychiatric: Thought content is normal, mood appropriate for clinical situation.     ASSESSMENT: Katie Wilkins is a 84 y.o. female who is s/p angioplasty of left common iliac artery for in stent stenosis on 09-11-16 by Dr. Trula Slade.  She is also s/p stenting of occluded left common iliac artery on 10-03-15 with a 7 mm Bard covered stent by Dr. Oneida Alar.   Pt states she does not walk  much, and has no barriers to walking, she has no claudication sx's with walking.  There are no signs of ischemia in her feet or legs.   Her DM is well controlled but unfortunately she continues to smoke since age 73.    DATA  Bilateral aortoiliac duplex (04/08/17): Patent left CIA stent, all biphasic waveforms.  Highest velocity of 259 cm/s in proximal stent. Highest velocity in right CIA system is is 283 cm/s in mid right EIA.    ABI (Date: 04/08/2017):  R:   ABI: 0.93 (was 0.91 on 09-03-16),   PT: waveform morphology not documented  DP: waveform morphology not documented  TBI:  Not documented  L:   ABI: 0.53 (was 0.46),   PT: waveform morphology not documented  DP: waveform morphology not documented  TBI: no documented  Slight improvement in bilateral ABI with mild disease  on the right and severe on the left.    PLAN: The patient was counseled re smoking cessation and given several free resources re smoking cessation.  Graduated walking program discussed and how to achieve.   Based on the patient's vascular studies and examination, pt will return to clinic in 6 months with ABI's and bilateral aortoiliac duplex. I advised pt to notify us if she develops concerns re the circulation in her feet or legs.   I discussed in depth with the patient the nature of atherosclerosis, and emphasized the importance of maximal medical management including strict control of blood pressure, blood glucose, and lipid levels, obtaining regular exercise, and cessation of smoking.  The patient is aware that without maximal medical management the underlying atherosclerotic disease process will progress, limiting the benefit of any interventions.  The patient was given information about PAD including signs, symptoms, treatment, what symptoms should prompt the patient to seek immediate medical care, and risk reduction measures to take.  Clemon Chambers, RN, MSN, FNP-C [facility] Phone: [phone]  Clinic MD: Trula Slade  04/08/17 10:02 AM

## 2017-04-10 ENCOUNTER — Telehealth: Payer: Self-pay | Admitting: Family

## 2017-04-10 NOTE — Telephone Encounter (Signed)
Added aorto-iliac bilat at 9:00 to pt's 10/07/17 appt - ABI at 10:00 and NP at 11:15. Mailed pt new appt letter with new time.

## 2017-04-10 NOTE — Telephone Encounter (Signed)
-----   Message from Sharmon Leyden Nickel, NP sent at 04/08/2017  8:51 PM EST ----- Regarding: add bilateral aortoiliac duplex to pt ABI's when she returns in 6 months Dear schedulers, Please add bilateral aortoiliac duplex to pt ABI's when she returns in 6 months. I may have neglected to request this.   Thanks, Vinnie Level

## 2017-04-12 DIAGNOSIS — J449 Chronic obstructive pulmonary disease, unspecified: Secondary | ICD-10-CM | POA: Diagnosis not present

## 2017-04-13 ENCOUNTER — Other Ambulatory Visit: Payer: Self-pay | Admitting: Family Medicine

## 2017-04-15 ENCOUNTER — Other Ambulatory Visit: Payer: Self-pay

## 2017-04-15 DIAGNOSIS — I7025 Atherosclerosis of native arteries of other extremities with ulceration: Secondary | ICD-10-CM

## 2017-04-15 DIAGNOSIS — S81802D Unspecified open wound, left lower leg, subsequent encounter: Secondary | ICD-10-CM

## 2017-04-30 ENCOUNTER — Ambulatory Visit: Payer: Medicare Other | Admitting: Family Medicine

## 2017-04-30 ENCOUNTER — Encounter: Payer: Self-pay | Admitting: Family Medicine

## 2017-04-30 VITALS — BP 129/65 | HR 67 | Temp 97.0°F | Ht 67.5 in | Wt 143.0 lb

## 2017-04-30 DIAGNOSIS — I739 Peripheral vascular disease, unspecified: Secondary | ICD-10-CM

## 2017-04-30 DIAGNOSIS — E559 Vitamin D deficiency, unspecified: Secondary | ICD-10-CM

## 2017-04-30 DIAGNOSIS — K219 Gastro-esophageal reflux disease without esophagitis: Secondary | ICD-10-CM | POA: Diagnosis not present

## 2017-04-30 DIAGNOSIS — J449 Chronic obstructive pulmonary disease, unspecified: Secondary | ICD-10-CM | POA: Diagnosis not present

## 2017-04-30 DIAGNOSIS — E7800 Pure hypercholesterolemia, unspecified: Secondary | ICD-10-CM | POA: Diagnosis not present

## 2017-04-30 DIAGNOSIS — E11610 Type 2 diabetes mellitus with diabetic neuropathic arthropathy: Secondary | ICD-10-CM | POA: Diagnosis not present

## 2017-04-30 DIAGNOSIS — I10 Essential (primary) hypertension: Secondary | ICD-10-CM

## 2017-04-30 DIAGNOSIS — I7025 Atherosclerosis of native arteries of other extremities with ulceration: Secondary | ICD-10-CM

## 2017-04-30 DIAGNOSIS — C211 Malignant neoplasm of anal canal: Secondary | ICD-10-CM | POA: Diagnosis not present

## 2017-04-30 DIAGNOSIS — L820 Inflamed seborrheic keratosis: Secondary | ICD-10-CM

## 2017-04-30 LAB — BAYER DCA HB A1C WAIVED: HB A1C (BAYER DCA - WAIVED): 5.7 % (ref <7.0)

## 2017-04-30 MED ORDER — HYDROCHLOROTHIAZIDE 25 MG PO TABS: 25.0000 mg | ORAL_TABLET | Freq: Every day | ORAL | 3 refills | Status: DC

## 2017-04-30 MED ORDER — AZITHROMYCIN 250 MG PO TABS: ORAL_TABLET | ORAL | 1 refills | Status: DC

## 2017-04-30 MED ORDER — AZITHROMYCIN 250 MG PO TABS: ORAL_TABLET | ORAL | 0 refills | Status: DC

## 2017-04-30 NOTE — Patient Instructions (Addendum)
Medicare Annual Wellness Visit  [facility] and the medical providers at [facility] strive to bring you the best medical care.  In doing so we not only want to address your current medical conditions and concerns but also to detect new conditions early and prevent illness, disease and health-related problems.    Medicare offers a yearly Wellness Visit which allows our clinical staff to assess your need for preventative services including immunizations, lifestyle education, counseling to decrease risk of preventable diseases and screening for fall risk and other medical concerns.    This visit is provided free of charge (no copay) for all Medicare recipients. The clinical pharmacists at [facility] have begun to conduct these Wellness Visits which will also include a thorough review of all your medications.    As you primary medical provider recommend that you make an appointment for your Annual Wellness Visit if you have not done so already this year.  You may set up this appointment before you leave today or you may call back ([phone]) and schedule an appointment.  Please make sure when you call that you mention that you are scheduling your Annual Wellness Visit with the clinical pharmacist so that the appointment may be made for the proper length of time.     Continue current medications. Continue good therapeutic lifestyle changes which include good diet and exercise. Fall precautions discussed with patient. If an FOBT was given today- please return it to our front desk. If you are over 26 years old - you may need Prevnar 33 or the adult Pneumonia vaccine.  **Flu shots are available--- please call and schedule a FLU-CLINIC appointment**  After your visit with [place] today you will receive a survey in the mail or online from [employer] regarding your care with [place]. Please take a moment to fill this out. Your feedback is very  important to [place] as you can help [place] better understand your patient needs as well as improve your experience and satisfaction. WE CARE ABOUT YOU!!!   Take Z-Pak as directed There will be 1 refill on this for future need if patient develops an infection in the future She should also take Mucinex maximum strength, plain 1 twice daily with a glass of water Check powder T to make sure it does not have sugar in it and also check the caffeine content Continue to follow-up with vascular surgery Continue to drink plenty of fluids and stay well-hydrated Always be careful do not put yourself at risk for falling

## 2017-04-30 NOTE — Progress Notes (Signed)
Subjective:    [name], female    DOB: 19-Jan-1932, 84 y.o.   MRN: [id number]  HPI Pt here for follow up and management of chronic medical problems which includes diabetes, hypertension and hyperlipidemia. He is taking medication regularly.  The patient comes with her daughter to the visit today.  She is complaining of a skin lesion on her left arm and wonders if she has some bronchitis.  She is also requesting a refill on her HCTZ.  She will get lab work today and her mammogram is due.  Her vital signs are stable and her weight is stable.  The patient continues to follow-up with the vascular surgeon for her circulation issues.  Most recently everything was stable.  She denies any chest pain or any worsening shortness of breath other than usual.  The daughter thinks that she seems to be more winded at times.  The patient is only taking Mucinex once a day we have encouraged her to change this to twice a day and use nasal saline 1 spray each nostril 4 times a day instead of just at bedtime.  She may be coming down with a slight cold or cough but her sputum is clear in color she does have some green nasal drainage.  She denies any trouble with her stomach or change in bowel habits.  No nausea vomiting diarrhea or blood in the stool.  Her stools are dark because of taking iron.  She is passing her water with out problems.  She has an irritated seborrheic keratosis on her left forearm at her wrist that she picks at a lot and it stays irritated.  The patient says that her home blood sugars have been running anywhere from 90-102 and she checks these periodically at home.  Her daughter checks on her frequently.    Patient Active Problem List   Diagnosis Date Noted  . Atherosclerosis of native arteries of the extremities with ulceration ([place]) 05/21/2016  . Non-healing wound of lower extremity 05/21/2016  . Type 2 diabetes mellitus treated without insulin ([place]) 03/11/2015  . Peripheral  vascular insufficiency ([place]) 09/21/2013  . Hip pain 09/21/2013  . Osteoporosis, senile 07/15/2013  . Vitamin D deficiency   . Anemia, iron deficiency 08/22/2010  . GERD (gastroesophageal reflux disease) 08/22/2010  . Guaiac positive stools 08/22/2010  . BOWEN'S DISEASE 12/24/2008  . Hypothyroidism 12/24/2008  . HEMORRHOIDS 12/24/2008  . RECTAL BLEEDING 12/24/2008  . ARTHRITIS 12/24/2008  . NEOPLASM, MALIGNANT, ANAL CANAL, SQUAMOUS CELL 12/23/2008  . Hyperlipidemia 12/23/2008  . CARDIOMYOPATHY 12/23/2008  . CHF 12/23/2008  . COPD (chronic obstructive pulmonary disease) with chronic bronchitis ([place]) 12/23/2008  . HIATAL HERNIA 12/23/2008  . DIVERTICULOSIS, COLON 12/23/2008  . RENAL CYST 12/23/2008   Outpatient Encounter Medications as of 04/30/2017  Medication Sig  . acetaminophen (TYLENOL) 325 MG tablet Take 650 mg by mouth every 6 (six) hours as needed (for pain).   Marland Kitchen albuterol (PROVENTIL HFA;VENTOLIN HFA) 108 (90 BASE) MCG/ACT inhaler Inhale 1 puff into the lungs every 4 (four) hours as needed for wheezing.   Marland Kitchen albuterol (PROVENTIL) (2.5 MG/3ML) 0.083% nebulizer solution USE ONE VIAL IN NEBULIZER EVERY 6 HOURS AS NEEDED  . aspirin EC 81 MG tablet Take 81 mg by mouth daily.  . budesonide (PULMICORT) 0.5 MG/2ML nebulizer solution USE ONE VIAL IN NEBULIZER TWICE DAILY  . CALCIUM PO Take 1 tablet by mouth every evening.  . Cholecalciferol (VITAMIN D3) 1000 UNITS tablet Take 2,000 Units by  mouth every evening.   . ferrous sulfate 325 (65 FE) MG tablet Take 325 mg by mouth every Monday, Wednesday, and Friday.   Marland Kitchen glucose blood (ONETOUCH VERIO) test strip USE TO CHECK GLUCOSE ONCE DAILY  . hydrochlorothiazide (HYDRODIURIL) 25 MG tablet TAKE 1 TABLET BY MOUTH ONCE DAILY  . hydroxypropyl methylcellulose / hypromellose (ISOPTO TEARS / GONIOVISC) 2.5 % ophthalmic solution Place 1 drop into both eyes 4 (four) times daily.  Marland Kitchen ketotifen (ALAWAY) 0.025 % ophthalmic solution Place 1 drop into both  eyes 4 (four) times daily.  Marland Kitchen levothyroxine (SYNTHROID, LEVOTHROID) 100 MCG tablet Take 100 mcg by mouth daily before breakfast.  . meclizine (ANTIVERT) 25 MG tablet Take 25 mg by mouth 2 (two) times daily.  . Misc. Devices (HUGO ROLLING WALKER ELITE) MISC Use daily for gait instability  . omega-3 acid ethyl esters (LOVAZA) 1 G capsule Take 1 g by mouth daily.   Glory Rosebush DELICA LANCETS FINE MISC Use  To check blood glucose once a day.  DX:  Type 2 DM E11.9  . Pitavastatin Calcium (LIVALO) 2 MG TABS Take 2 mg by mouth every evening.   Facility-Administered Encounter Medications as of 04/30/2017  Medication  . cyanocobalamin ((VITAMIN B-12)) injection 1,000 mcg      Review of Systems  Constitutional: Negative.   HENT: Negative.   Eyes: Negative.   Respiratory: Negative.   Cardiovascular: Negative.   Gastrointestinal: Negative.   Endocrine: Negative.   Genitourinary: Negative.   Musculoskeletal: Negative.   Skin: Negative.   Allergic/Immunologic: Negative.   Neurological: Negative.   Hematological: Negative.   Psychiatric/Behavioral: Negative.        Objective:   Physical Exam  Constitutional: She is oriented to person, place, and time. She appears well-developed and well-nourished. No distress.  The patient is elderly appearing but pleasant and alert  HENT:  Head: Normocephalic and atraumatic.  Right Ear: External ear normal.  Left Ear: External ear normal.  Nose: Nose normal.  Mouth/Throat: Oropharynx is clear and moist. No oropharyngeal exudate.  Eyes: Conjunctivae and EOM are normal. Pupils are equal, round, and reactive to light. Right eye exhibits no discharge. Left eye exhibits no discharge. No scleral icterus.  Neck: Normal range of motion. Neck supple. No thyromegaly present.  No bruits thyromegaly or anterior cervical adenopathy  Cardiovascular: Normal rate, regular rhythm, normal heart sounds and intact distal pulses.  No murmur heard. Heart is slightly  irregular at 72/min  Pulmonary/Chest: Effort normal. No respiratory distress. She has no wheezes. She has no rales.  Irritated cough that is nonproductive  Abdominal: Soft. Bowel sounds are normal. She exhibits no mass. There is no tenderness. There is no rebound and no guarding.  No abdominal tenderness masses or bruits  Musculoskeletal: Normal range of motion. She exhibits no edema.  Lymphadenopathy:    She has no cervical adenopathy.  Neurological: She is alert and oriented to person, place, and time. She has normal reflexes. No cranial nerve deficit.  Reflexes were diminished bilaterally in the lower extremity  Skin: Skin is warm and dry. No rash noted.  Very dry skin over most of body including feet Cryotherapy to irritated seborrheic keratosis on left lateral wrist.  Patient tolerated procedure well.  She was instructed on how to take care of this and to keep it covered with a Band-Aid so she will not continue to scratch this lesion.  She was told to clean it with alcohol.  Psychiatric: She has a normal mood and affect. Her  behavior is normal. Judgment and thought content normal.  Nursing note and vitals reviewed.   BP 129/65 (BP Location: Left Arm)   Pulse 67   Temp (!) 97 F (36.1 C) (Oral)   Ht 5' 7.5" (1.715 m)   Wt 143 lb (64.9 kg)   BMI 22.07 kg/m        Assessment & Plan:  1. Essential hypertension -The blood pressure was good and patient will continue with current treatment - BMP8+EGFR - CBC with Differential/Platelet - Hepatic function panel  2. Pure hypercholesterolemia -Continue with current treatment pending results of lab work - CBC with Differential/Platelet - Lipid panel  3. Vitamin D deficiency -Continue with current treatment pending results of lab work - CBC with Differential/Platelet - VITAMIN D 25 Hydroxy (Vit-D Deficiency, Fractures)  4. Type 2 diabetes mellitus with diabetic neuropathic arthropathy, without long-term current use of insulin  ([place]) -Continue with current treatment.  Blood sugars at home of been running very good.  Patient checks her feet regularly. - CBC with Differential/Platelet - Bayer DCA Hb A1c Waived  5. Gastroesophageal reflux disease, esophagitis presence not specified -No complaints with reflux symptoms today. - CBC with Differential/Platelet  6. Chronic obstructive pulmonary disease, unspecified COPD type ([place]) -Patient does have an acute exacerbation today.  She will take a Z-Pak and increase her Mucinex to taking it twice daily with a large glass of water.  7. Atherosclerosis of native arteries of the extremities with ulceration ([place]) -Continue with statin therapy and with as aggressive therapeutic lifestyle changes as possible  8. Peripheral vascular insufficiency ([facility]) -Continue follow-up with vascular surgery  9. COPD (chronic obstructive pulmonary disease) with chronic bronchitis ([place]) -Take Z-Pak as directed for acute exacerbation  10. NEOPLASM, MALIGNANT, ANAL CANAL, SQUAMOUS CELL -Follow-up regularly as needed with a dermatologist  11.  Irritated seborrheic keratosis left lateral wrist -Cryotherapy to this lesion and patient tolerated procedure well   Meds ordered this encounter  Medications  . hydrochlorothiazide (HYDRODIURIL) 25 MG tablet    Sig: Take 1 tablet (25 mg total) by mouth daily.    Dispense:  90 tablet    Refill:  3  . DISCONTD: azithromycin (ZITHROMAX) 250 MG tablet    Sig: As directed    Dispense:  6 tablet    Refill:  0  . azithromycin (ZITHROMAX) 250 MG tablet    Sig: As directed    Dispense:  6 tablet    Refill:  1   Patient Instructions                       Medicare Annual Wellness Visit  [facility] and the medical providers at [facility] strive to bring you the best medical care.  In doing so we not only want to address your current medical conditions and concerns but also to detect new conditions early and prevent illness,  disease and health-related problems.    Medicare offers a yearly Wellness Visit which allows our clinical staff to assess your need for preventative services including immunizations, lifestyle education, counseling to decrease risk of preventable diseases and screening for fall risk and other medical concerns.    This visit is provided free of charge (no copay) for all Medicare recipients. The clinical pharmacists at [facility] have begun to conduct these Wellness Visits which will also include a thorough review of all your medications.    As you primary medical provider recommend that you make an appointment  for your Annual Wellness Visit if you have not done so already this year.  You may set up this appointment before you leave today or you may call back ([phone]) and schedule an appointment.  Please make sure when you call that you mention that you are scheduling your Annual Wellness Visit with the clinical pharmacist so that the appointment may be made for the proper length of time.     Continue current medications. Continue good therapeutic lifestyle changes which include good diet and exercise. Fall precautions discussed with patient. If an FOBT was given today- please return it to our front desk. If you are over 2 years old - you may need Prevnar 45 or the adult Pneumonia vaccine.  **Flu shots are available--- please call and schedule a FLU-CLINIC appointment**  After your visit with [place] today you will receive a survey in the mail or online from [employer] regarding your care with [place]. Please take a moment to fill this out. Your feedback is very important to [place] as you can help [place] better understand your patient needs as well as improve your experience and satisfaction. WE CARE ABOUT YOU!!!   Take Z-Pak as directed There will be 1 refill on this for future need if patient develops an infection in the future She should also take Mucinex maximum strength, plain 1  twice daily with a glass of water Check powder T to make sure it does not have sugar in it and also check the caffeine content Continue to follow-up with vascular surgery Continue to drink plenty of fluids and stay well-hydrated Always be careful do not put yourself at risk for falling  Arrie Senate MD

## 2017-05-01 LAB — BMP8+EGFR
BUN / CREAT RATIO: 29 — AB (ref 12–28)
BUN: 22 mg/dL (ref 8–27)
CO2: 27 mmol/L (ref 20–29)
CREATININE: 0.75 mg/dL (ref 0.57–1.00)
Calcium: 9.8 mg/dL (ref 8.7–10.3)
Chloride: 102 mmol/L (ref 96–106)
GFR calc Af Amer: 85 mL/min/{1.73_m2} (ref >59)
GFR calc non Af Amer: 74 mL/min/{1.73_m2} (ref >59)
Glucose: 91 mg/dL (ref 65–99)
POTASSIUM: 3.5 mmol/L (ref 3.5–5.2)
SODIUM: 145 mmol/L — AB (ref 134–144)

## 2017-05-01 LAB — HEPATIC FUNCTION PANEL
ALBUMIN: 4.6 g/dL (ref 3.5–4.7)
ALT: 6 IU/L (ref 0–32)
AST: 12 IU/L (ref 0–40)
Alkaline Phosphatase: 47 IU/L (ref 39–117)
Bilirubin Total: 0.4 mg/dL (ref 0.0–1.2)
Bilirubin, Direct: 0.12 mg/dL (ref 0.00–0.40)
Total Protein: 6.8 g/dL (ref 6.0–8.5)

## 2017-05-01 LAB — LIPID PANEL
CHOL/HDL RATIO: 3.5 ratio (ref 0.0–4.4)
Cholesterol, Total: 156 mg/dL (ref 100–199)
HDL: 44 mg/dL (ref >39)
LDL Calculated: 85 mg/dL (ref 0–99)
Triglycerides: 135 mg/dL (ref 0–149)
VLDL Cholesterol Cal: 27 mg/dL (ref 5–40)

## 2017-05-01 LAB — CBC WITH DIFFERENTIAL/PLATELET
Basophils Absolute: 0.1 10*3/uL (ref 0.0–0.2)
Basos: 1 %
EOS (ABSOLUTE): 0.3 10*3/uL (ref 0.0–0.4)
EOS: 4 %
HEMATOCRIT: 41.1 % (ref 34.0–46.6)
Hemoglobin: 14 g/dL (ref 11.1–15.9)
Immature Grans (Abs): 0 10*3/uL (ref 0.0–0.1)
Immature Granulocytes: 0 %
Lymphocytes Absolute: 2.4 10*3/uL (ref 0.7–3.1)
Lymphs: 27 %
MCH: 27.9 pg (ref 26.6–33.0)
MCHC: 34.1 g/dL (ref 31.5–35.7)
MCV: 82 fL (ref 79–97)
MONOS ABS: 0.5 10*3/uL (ref 0.1–0.9)
Monocytes: 6 %
Neutrophils Absolute: 5.4 10*3/uL (ref 1.4–7.0)
Neutrophils: 62 %
Platelets: 228 10*3/uL (ref 150–379)
RBC: 5.01 x10E6/uL (ref 3.77–5.28)
RDW: 14.9 % (ref 12.3–15.4)
WBC: 8.6 10*3/uL (ref 3.4–10.8)

## 2017-05-01 LAB — VITAMIN D 25 HYDROXY (VIT D DEFICIENCY, FRACTURES): Vit D, 25-Hydroxy: 63 ng/mL (ref 30.0–100.0)

## 2017-05-08 ENCOUNTER — Ambulatory Visit: Payer: Medicare Other

## 2017-05-15 ENCOUNTER — Ambulatory Visit (INDEPENDENT_AMBULATORY_CARE_PROVIDER_SITE_OTHER): Payer: Medicare Other | Admitting: *Deleted

## 2017-05-15 DIAGNOSIS — E538 Deficiency of other specified B group vitamins: Secondary | ICD-10-CM | POA: Diagnosis not present

## 2017-05-15 NOTE — Patient Instructions (Signed)
Vitamin B12 Deficiency Vitamin B12 deficiency means that your body is not getting enough vitamin B12. Your body needs vitamin B12 for important bodily functions. If you do not have enough vitamin B12 in your body, you can have health problems. Follow these instructions at home:  Take supplements only as told by your doctor. Follow the directions carefully.  Get any shots (injections) as told by your doctor. Do not miss your visits to the doctor.  Eat lots of healthy foods that contain vitamin B12. Ask your doctor if you should work with someone who is trained in how food affects health (dietitian). Foods that contain vitamin B12 include: ? Meat. ? Meat from birds (poultry). ? Fish. ? Eggs. ? Cereal and dairy products that are fortified. This means that vitamin B12 has been added to the food. Check the label on the package to see if the food is fortified.  Do not drink too much (do not abuse) alcohol.  Keep all follow-up visits as told by your doctor. This is important. Contact a doctor if:  Your symptoms come back. Get help right away if:  You have trouble breathing.  You have chest pain.  You get dizzy.  You pass out (lose consciousness). This information is not intended to replace advice given to you by your health care provider. Make sure you discuss any questions you have with your health care provider. Document Released: 02/22/2011 Document Revised: 08/11/2015 Document Reviewed: 07/21/2014 Elsevier Interactive Patient Education  2018 Elsevier Inc.  

## 2017-05-15 NOTE — Progress Notes (Signed)
Vitamin b12 injection given and patient tolerated well.  

## 2017-05-24 DIAGNOSIS — E119 Type 2 diabetes mellitus without complications: Secondary | ICD-10-CM | POA: Diagnosis not present

## 2017-06-13 ENCOUNTER — Ambulatory Visit (INDEPENDENT_AMBULATORY_CARE_PROVIDER_SITE_OTHER): Payer: Medicare Other | Admitting: *Deleted

## 2017-06-13 DIAGNOSIS — E538 Deficiency of other specified B group vitamins: Secondary | ICD-10-CM

## 2017-06-13 DIAGNOSIS — D508 Other iron deficiency anemias: Secondary | ICD-10-CM | POA: Diagnosis not present

## 2017-06-13 NOTE — Progress Notes (Signed)
Pt given Cyanocobalamin inj Pt tolerated well

## 2017-06-20 ENCOUNTER — Other Ambulatory Visit: Payer: Self-pay | Admitting: Family Medicine

## 2017-06-20 DIAGNOSIS — J440 Chronic obstructive pulmonary disease with acute lower respiratory infection: Secondary | ICD-10-CM

## 2017-06-20 DIAGNOSIS — J209 Acute bronchitis, unspecified: Secondary | ICD-10-CM

## 2017-06-21 DIAGNOSIS — J440 Chronic obstructive pulmonary disease with acute lower respiratory infection: Secondary | ICD-10-CM | POA: Diagnosis not present

## 2017-06-21 DIAGNOSIS — J449 Chronic obstructive pulmonary disease, unspecified: Secondary | ICD-10-CM | POA: Diagnosis not present

## 2017-07-16 ENCOUNTER — Ambulatory Visit (INDEPENDENT_AMBULATORY_CARE_PROVIDER_SITE_OTHER): Payer: Medicare Other | Admitting: *Deleted

## 2017-07-16 DIAGNOSIS — E538 Deficiency of other specified B group vitamins: Secondary | ICD-10-CM | POA: Diagnosis not present

## 2017-07-16 NOTE — Progress Notes (Signed)
Pt given Cyanocobalamin inj Tolerated well 

## 2017-08-13 ENCOUNTER — Ambulatory Visit: Payer: Medicare Other

## 2017-08-16 IMAGING — DX DG RIBS W/ CHEST 3+V*R*
3 series · 3 of 3 positions shown · non-contrast
Comparison: 01/22/2014 chest radiographs

CLINICAL DATA: Right rib pain after a fall against a dresser this
morning. Initial encounter.

EXAM:
RIGHT RIBS AND CHEST - 3+ VIEW

[chest pa]
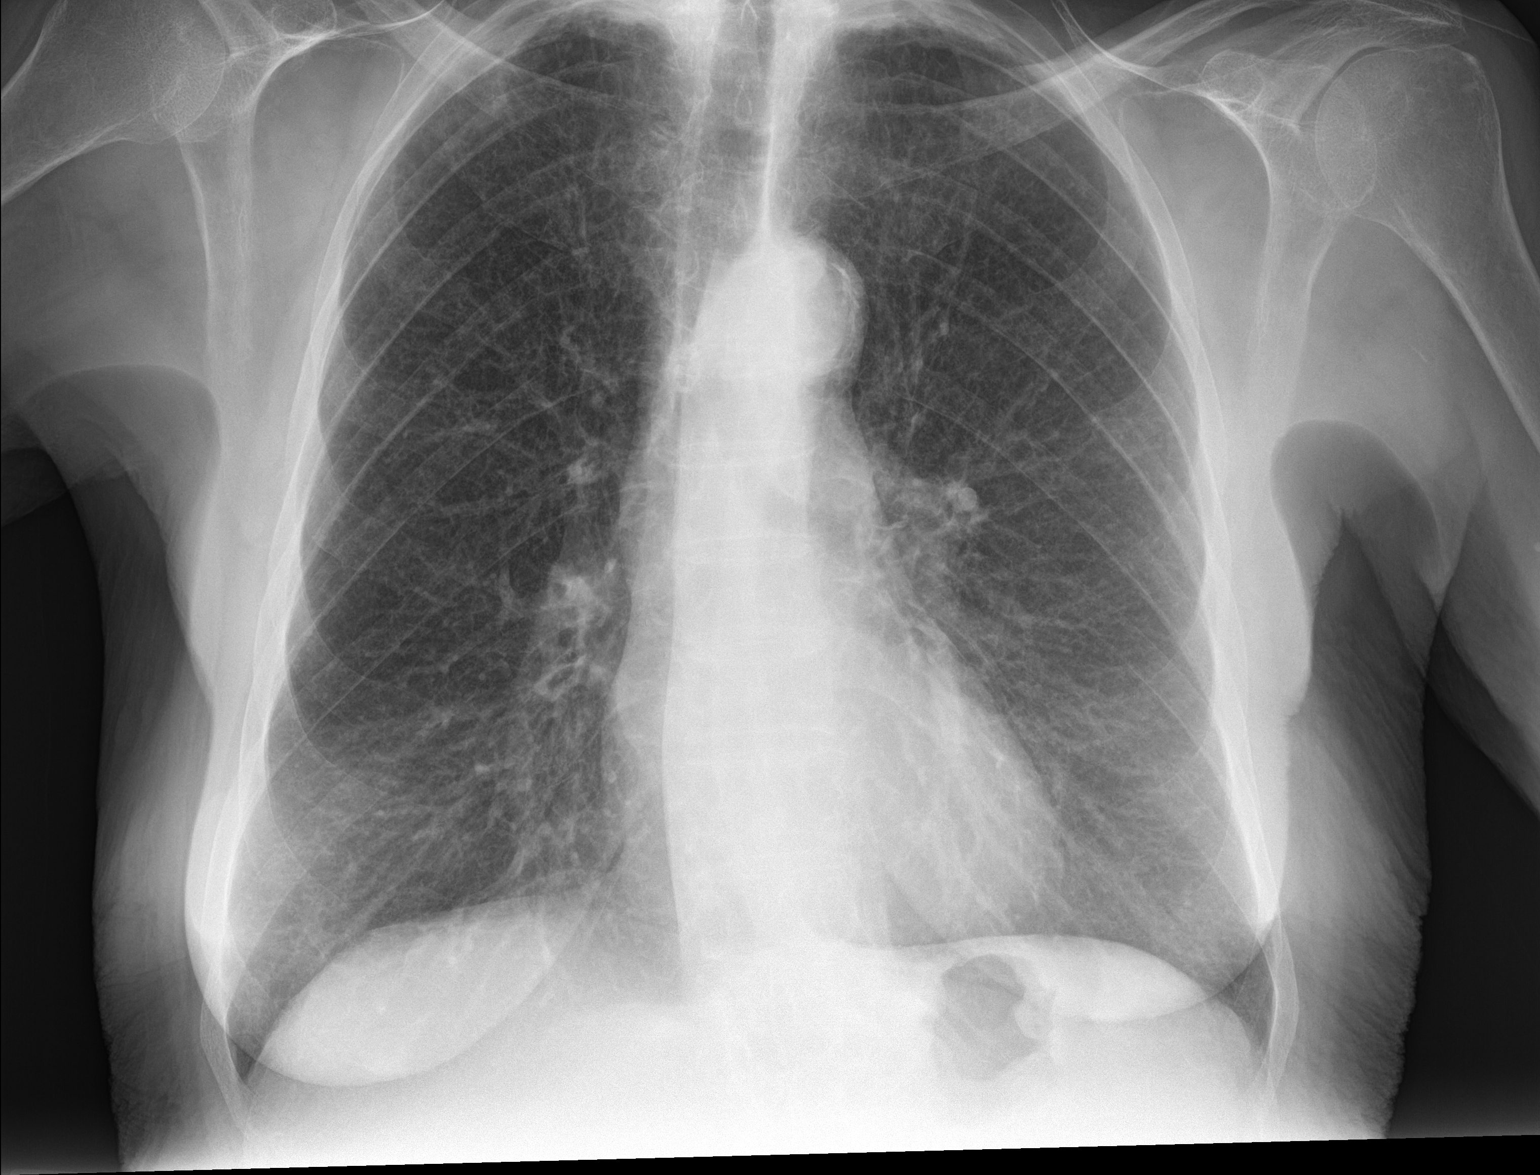

[rib obl (1 of 2)]
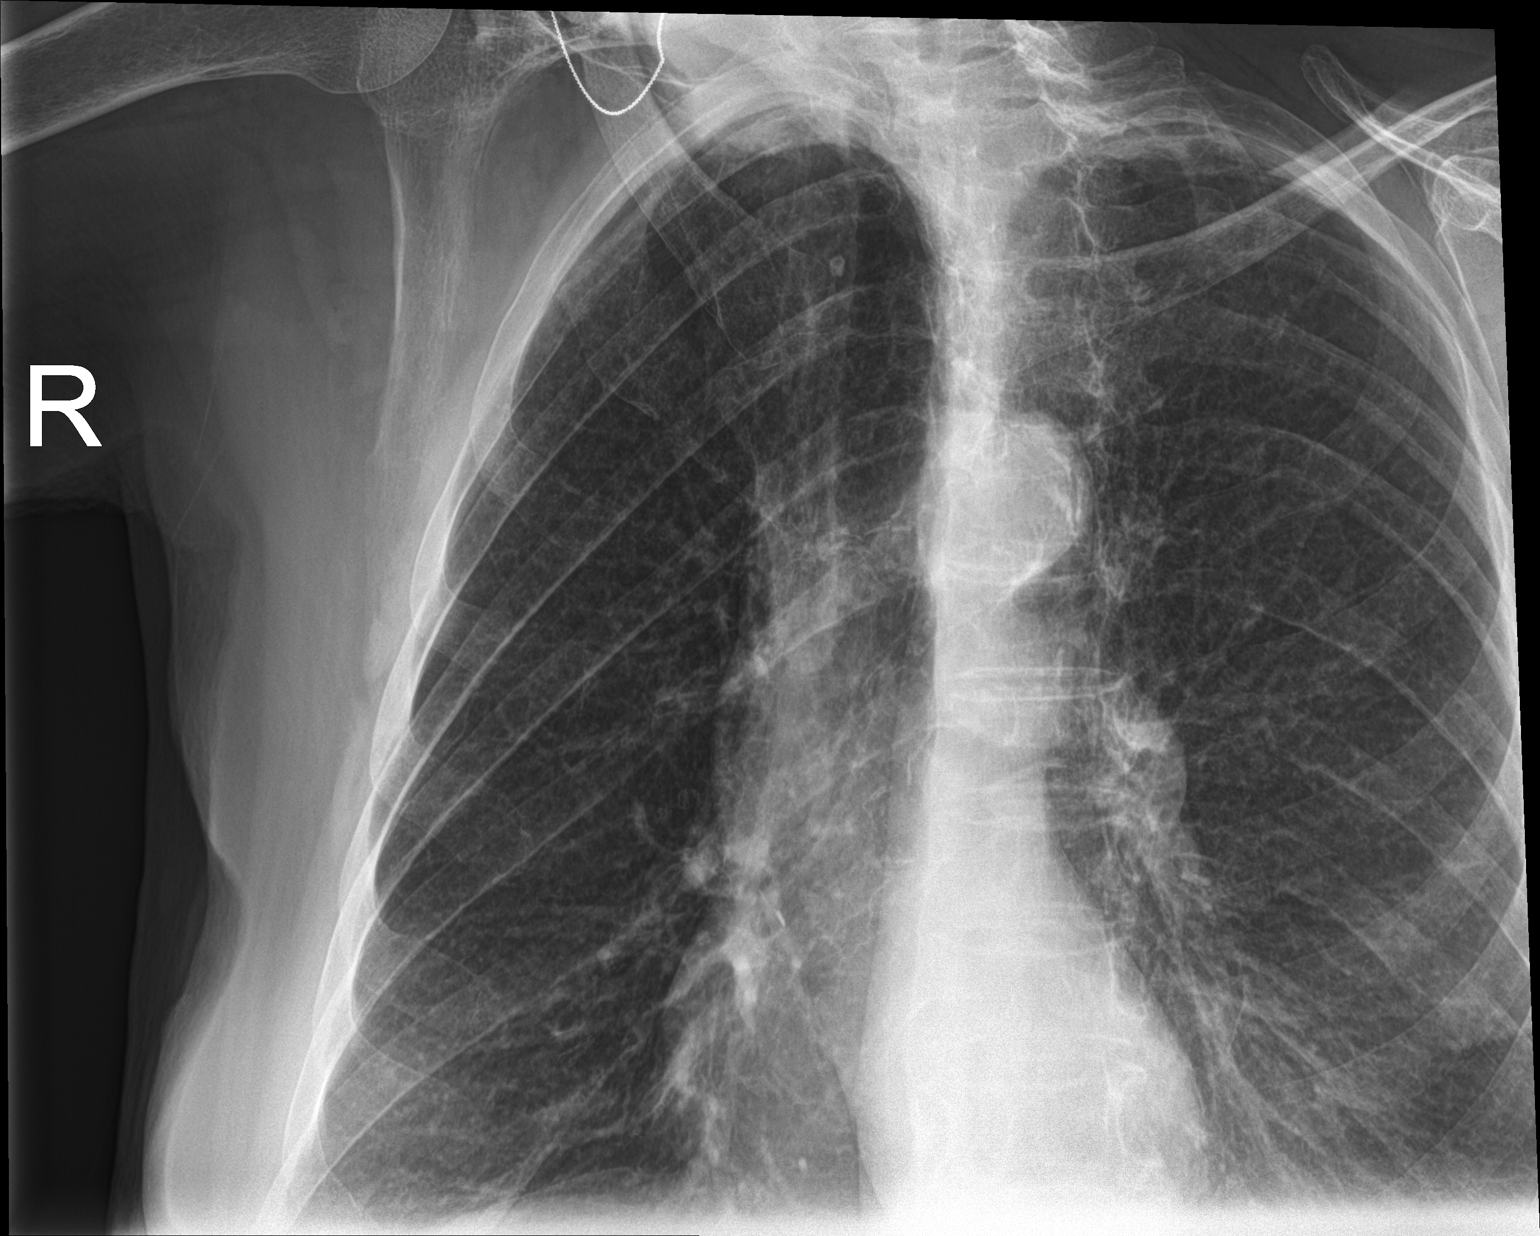

[rib obl (2 of 2)]
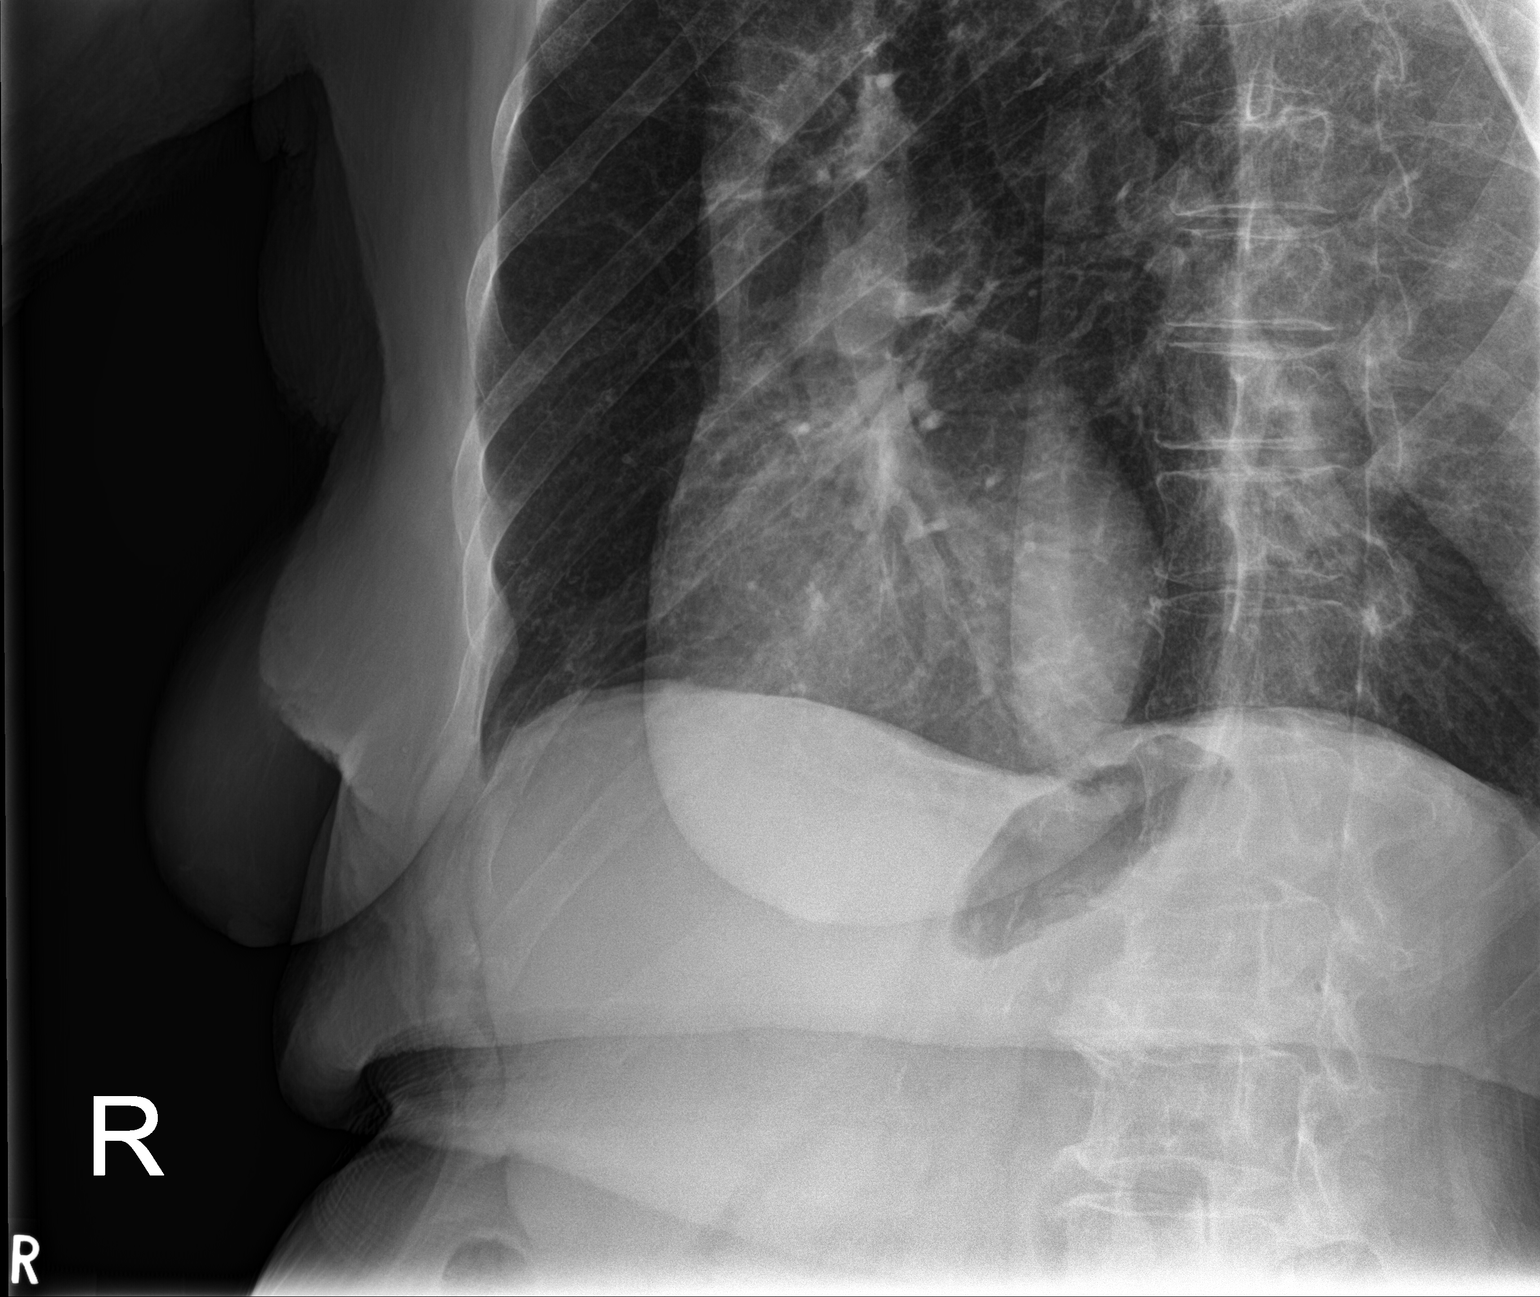

[3 of 3 positions shown; findings below may reference images not displayed]

FINDINGS: The cardiomediastinal silhouette is within normal limits. Thoracic
aortic atherosclerosis is again seen. The lungs remain hyperinflated
with evidence of underlying emphysema. There is biapical scarring.
No acute airspace consolidation, edema, pleural effusion, or
pneumothorax is identified. No displaced rib fracture is identified.
IMPRESSION: 1. No displaced rib fracture identified.
2. Emphysema.
3. Aortic atherosclerosis.

## 2017-08-19 ENCOUNTER — Encounter: Payer: Self-pay | Admitting: Physician Assistant

## 2017-08-19 ENCOUNTER — Ambulatory Visit: Payer: Medicare Other | Admitting: Physician Assistant

## 2017-08-19 VITALS — BP 138/56 | HR 57 | Temp 96.3°F | Ht 67.0 in | Wt 135.0 lb

## 2017-08-19 DIAGNOSIS — D508 Other iron deficiency anemias: Secondary | ICD-10-CM

## 2017-08-19 DIAGNOSIS — L03115 Cellulitis of right lower limb: Secondary | ICD-10-CM | POA: Diagnosis not present

## 2017-08-19 MED ORDER — CEPHALEXIN 500 MG PO CAPS: 500.0000 mg | ORAL_CAPSULE | Freq: Two times a day (BID) | ORAL | 0 refills | Status: DC

## 2017-08-19 NOTE — Patient Instructions (Signed)

## 2017-08-19 NOTE — Progress Notes (Signed)
  Subjective:     [name], female   DOB: 1931/12/06, 84 y.o.   MRN: [id number]  HPI Redness and swelling to the R foot for 2-3 days Denies any trauma to the foot BS readings have remained normal  Review of Systems  Constitutional: Negative.    + pain,redness, and swelling to the R foot Denies numbness to the foot Denies pain to the ankle or calf area    Objective:   Physical Exam + erythema and edema mainly to the lateral R foot ++ general TTP of same Good pulses to the foot  Sensory intact No pretib edema No calf TTP FROM of the foot/ankle    Assessment:     1. Cellulitis of right foot        Plan:     Keflex 500mg  bid x 1 week Nl course reviewed Keep foot elevated Continue with nightly foot checks Pt to return immed if any worsening of sx

## 2017-08-20 ENCOUNTER — Ambulatory Visit: Payer: Medicare Other

## 2017-08-22 ENCOUNTER — Ambulatory Visit: Payer: Medicare Other | Admitting: Physician Assistant

## 2017-08-22 ENCOUNTER — Encounter: Payer: Self-pay | Admitting: Physician Assistant

## 2017-08-22 ENCOUNTER — Ambulatory Visit (INDEPENDENT_AMBULATORY_CARE_PROVIDER_SITE_OTHER): Payer: Medicare Other

## 2017-08-22 VITALS — BP 136/61 | HR 65 | Temp 97.2°F | Ht 67.0 in | Wt 135.2 lb

## 2017-08-22 DIAGNOSIS — M79671 Pain in right foot: Secondary | ICD-10-CM

## 2017-08-22 NOTE — Patient Instructions (Signed)
Foot Pain Many things can cause foot pain. Some common causes are:  An injury.  A sprain.  Arthritis.  Blisters.  Bunions.  Follow these instructions at home: Pay attention to any changes in your symptoms. Take these actions to help with your discomfort:  If directed, put ice on the affected area: ? Put ice in a plastic bag. ? Place a towel between your skin and the bag. ? Leave the ice on for 15-20 minutes, 3?4 times a day for 2 days.  Take over-the-counter and prescription medicines only as told by your health care provider.  Wear comfortable, supportive shoes that fit you well. Do not wear high heels.  Do not stand or walk for long periods of time.  Do not lift a lot of weight. This can put added pressure on your feet.  Do stretches to relieve foot pain and stiffness as told by your health care provider.  Rub your foot gently.  Keep your feet clean and dry.  Contact a health care provider if:  Your pain does not get better after a few days of self-care.  Your pain gets worse.  You cannot stand on your foot. Get help right away if:  Your foot is numb or tingling.  Your foot or toes are swollen.  Your foot or toes turn white or blue.  You have warmth and redness along your foot. This information is not intended to replace advice given to you by your health care provider. Make sure you discuss any questions you have with your health care provider. Document Released: 04/01/2015 Document Revised: 08/11/2015 Document Reviewed: 03/31/2014 Elsevier Interactive Patient Education  2018 Elsevier Inc.  

## 2017-08-22 NOTE — Progress Notes (Signed)
  Subjective:     [name], female   DOB: [birth year], 84 y.o.   MRN: [id number]  HPI Pt here for f/u of R foot pain Seen for same on Monday and started on Keflex Sig pain yesterday and wanted recheck to make sure everything ok Using OTC Ibuprofen for sx  Review of Systems  Constitutional: Positive for activity change. Negative for appetite change and fever.  Musculoskeletal: Positive for arthralgias and gait problem. Negative for joint swelling.   Denies numbness to the foot    Objective:   Physical Exam No ecchy to the R foot Good pulses and sensory to the foot The erythema and edema have improved since the last visit Still with some TTP of the midfoot FROM of the ankle/foot Xray- degen changes; will be over-read    Assessment:     1. Right foot pain        Plan:     Continue with the ATB She is taking very low dose ibuprofen so will have her increase this for 2-3 days Heat/Ice Activities as tol F/U prn

## 2017-09-02 DIAGNOSIS — E119 Type 2 diabetes mellitus without complications: Secondary | ICD-10-CM | POA: Diagnosis not present

## 2017-09-04 ENCOUNTER — Ambulatory Visit: Payer: Medicare Other | Admitting: Family Medicine

## 2017-09-04 ENCOUNTER — Encounter: Payer: Self-pay | Admitting: Family Medicine

## 2017-09-04 VITALS — BP 120/65 | HR 68 | Temp 97.0°F | Ht 67.0 in | Wt 133.0 lb

## 2017-09-04 DIAGNOSIS — M79671 Pain in right foot: Secondary | ICD-10-CM

## 2017-09-04 DIAGNOSIS — J209 Acute bronchitis, unspecified: Secondary | ICD-10-CM

## 2017-09-04 DIAGNOSIS — I7025 Atherosclerosis of native arteries of other extremities with ulceration: Secondary | ICD-10-CM

## 2017-09-04 DIAGNOSIS — E11610 Type 2 diabetes mellitus with diabetic neuropathic arthropathy: Secondary | ICD-10-CM | POA: Diagnosis not present

## 2017-09-04 DIAGNOSIS — E538 Deficiency of other specified B group vitamins: Secondary | ICD-10-CM | POA: Diagnosis not present

## 2017-09-04 DIAGNOSIS — E7800 Pure hypercholesterolemia, unspecified: Secondary | ICD-10-CM

## 2017-09-04 DIAGNOSIS — E559 Vitamin D deficiency, unspecified: Secondary | ICD-10-CM

## 2017-09-04 DIAGNOSIS — K219 Gastro-esophageal reflux disease without esophagitis: Secondary | ICD-10-CM

## 2017-09-04 DIAGNOSIS — J449 Chronic obstructive pulmonary disease, unspecified: Secondary | ICD-10-CM | POA: Diagnosis not present

## 2017-09-04 DIAGNOSIS — I10 Essential (primary) hypertension: Secondary | ICD-10-CM

## 2017-09-04 DIAGNOSIS — J440 Chronic obstructive pulmonary disease with acute lower respiratory infection: Secondary | ICD-10-CM

## 2017-09-04 DIAGNOSIS — S91302A Unspecified open wound, left foot, initial encounter: Secondary | ICD-10-CM | POA: Diagnosis not present

## 2017-09-04 DIAGNOSIS — I739 Peripheral vascular disease, unspecified: Secondary | ICD-10-CM | POA: Diagnosis not present

## 2017-09-04 LAB — BAYER DCA HB A1C WAIVED: HB A1C (BAYER DCA - WAIVED): 6 % (ref <7.0)

## 2017-09-04 MED ORDER — CEPHALEXIN 500 MG PO CAPS: 500.0000 mg | ORAL_CAPSULE | Freq: Two times a day (BID) | ORAL | 0 refills | Status: DC

## 2017-09-04 MED ORDER — FERROUS SULFATE 325 (65 FE) MG PO TABS: 325.0000 mg | ORAL_TABLET | ORAL | 11 refills | Status: DC

## 2017-09-04 MED ORDER — HYDROCHLOROTHIAZIDE 25 MG PO TABS: 25.0000 mg | ORAL_TABLET | Freq: Every day | ORAL | 3 refills | Status: DC

## 2017-09-04 MED ORDER — PITAVASTATIN CALCIUM 2 MG PO TABS: 2.0000 mg | ORAL_TABLET | Freq: Every evening | ORAL | 3 refills | Status: DC

## 2017-09-04 MED ORDER — BUDESONIDE 0.5 MG/2ML IN SUSP: RESPIRATORY_TRACT | 11 refills | Status: DC

## 2017-09-04 MED ORDER — MUPIROCIN 2 % EX OINT: 1.0000 "application " | TOPICAL_OINTMENT | Freq: Two times a day (BID) | CUTANEOUS | 0 refills | Status: DC

## 2017-09-04 MED ORDER — ALBUTEROL SULFATE (2.5 MG/3ML) 0.083% IN NEBU: INHALATION_SOLUTION | RESPIRATORY_TRACT | 5 refills | Status: DC

## 2017-09-04 MED ORDER — LEVOTHYROXINE SODIUM 100 MCG PO TABS: 100.0000 ug | ORAL_TABLET | Freq: Every day | ORAL | 3 refills | Status: DC

## 2017-09-04 MED ORDER — OMEGA-3-ACID ETHYL ESTERS 1 G PO CAPS: 1.0000 g | ORAL_CAPSULE | Freq: Every day | ORAL | 3 refills | Status: DC

## 2017-09-04 MED ORDER — CEPHALEXIN 500 MG PO CAPS
500.0000 mg | ORAL_CAPSULE | Freq: Two times a day (BID) | ORAL | 0 refills | Status: DC
Start: 2017-09-04 — End: 2017-09-17

## 2017-09-04 MED ORDER — ALBUTEROL SULFATE HFA 108 (90 BASE) MCG/ACT IN AERS: 1.0000 | INHALATION_SPRAY | RESPIRATORY_TRACT | 11 refills | Status: AC | PRN

## 2017-09-04 NOTE — Progress Notes (Signed)
Subjective:    [name], female    DOB: June 22, 1931, 84 y.o.   MRN: [id number]  HPI Pt here for follow up and management of chronic medical problems which includes diabetes, hypertension and hyperlipidemia. She is taking medication regularly.  The patient comes to the visit today with her daughter.  She is complaining with ongoing right foot pain and swelling.  She just took and finished a round of Keflex.  He also so has a lesion on the left wrist which she would like for [place] to refreeze with cryotherapy.  She wants refills on all of her medicines.  She refuses to do mammograms.  Her vital signs are stable.  She is on iron for iron deficiency anemia has hypertension hypothyroidism and hyperlipidemia.  The patient is pleasant and relaxed and looks great today.  The burn wound on her left foot is healed completely.  The patient denies any chest pain pressure tightness shortness of breath trouble swallowing heartburn nausea vomiting diarrhea or blood in the stool.  She does have dark stools because she is on iron replacement.    Patient Active Problem List   Diagnosis Date Noted  . Atherosclerosis of native arteries of the extremities with ulceration ([place]) 05/21/2016  . Non-healing wound of lower extremity 05/21/2016  . Type 2 diabetes mellitus treated without insulin ([place]) 03/11/2015  . Peripheral vascular insufficiency ([place]) 09/21/2013  . Hip pain 09/21/2013  . Osteoporosis, senile 07/15/2013  . Vitamin D deficiency   . Anemia, iron deficiency 08/22/2010  . GERD (gastroesophageal reflux disease) 08/22/2010  . Guaiac positive stools 08/22/2010  . BOWEN'S DISEASE 12/24/2008  . Hypothyroidism 12/24/2008  . HEMORRHOIDS 12/24/2008  . RECTAL BLEEDING 12/24/2008  . ARTHRITIS 12/24/2008  . NEOPLASM, MALIGNANT, ANAL CANAL, SQUAMOUS CELL 12/23/2008  . Hyperlipidemia 12/23/2008  . CARDIOMYOPATHY 12/23/2008  . CHF 12/23/2008  . COPD (chronic obstructive pulmonary disease) with  chronic bronchitis ([place]) 12/23/2008  . HIATAL HERNIA 12/23/2008  . DIVERTICULOSIS, COLON 12/23/2008  . RENAL CYST 12/23/2008   Outpatient Encounter Medications as of 09/04/2017  Medication Sig  . acetaminophen (TYLENOL) 325 MG tablet Take 650 mg by mouth every 6 (six) hours as needed (for pain).   Marland Kitchen albuterol (PROVENTIL HFA;VENTOLIN HFA) 108 (90 BASE) MCG/ACT inhaler Inhale 1 puff into the lungs every 4 (four) hours as needed for wheezing.   Marland Kitchen albuterol (PROVENTIL) (2.5 MG/3ML) 0.083% nebulizer solution USE ONE VIAL IN NEBULIZER EVERY 6 HOURS AS NEEDED  . aspirin EC 81 MG tablet Take 81 mg by mouth daily.  . budesonide (PULMICORT) 0.5 MG/2ML nebulizer solution USE 1 VIAL IN NEBULIZER TWICE DAILY  . CALCIUM PO Take 1 tablet by mouth every evening.  . Cholecalciferol (VITAMIN D3) 1000 UNITS tablet Take 2,000 Units by mouth every evening.   . ferrous sulfate 325 (65 FE) MG tablet Take 325 mg by mouth every Monday, Wednesday, and Friday.   Marland Kitchen glucose blood (ONETOUCH VERIO) test strip USE TO CHECK GLUCOSE ONCE DAILY  . hydrochlorothiazide (HYDRODIURIL) 25 MG tablet Take 1 tablet (25 mg total) by mouth daily.  . hydroxypropyl methylcellulose / hypromellose (ISOPTO TEARS / GONIOVISC) 2.5 % ophthalmic solution Place 1 drop into both eyes 4 (four) times daily.  Marland Kitchen ketotifen (ALAWAY) 0.025 % ophthalmic solution Place 1 drop into both eyes 4 (four) times daily.  Marland Kitchen levothyroxine (SYNTHROID, LEVOTHROID) 100 MCG tablet Take 100 mcg by mouth daily before breakfast.  . meclizine (ANTIVERT) 25 MG tablet Take 25 mg by mouth  2 (two) times daily.  . Misc. Devices (HUGO ROLLING WALKER ELITE) MISC Use daily for gait instability  . omega-3 acid ethyl esters (LOVAZA) 1 G capsule Take 1 g by mouth daily.   Glory Rosebush DELICA LANCETS FINE MISC Use  To check blood glucose once a day.  DX:  Type 2 DM E11.9  . Pitavastatin Calcium (LIVALO) 2 MG TABS Take 2 mg by mouth every evening.  . [DISCONTINUED] cephALEXin (KEFLEX) 500  MG capsule Take 1 capsule (500 mg total) by mouth 2 (two) times daily.   Facility-Administered Encounter Medications as of 09/04/2017  Medication  . cyanocobalamin ((VITAMIN B-12)) injection 1,000 mcg      Review of Systems  Constitutional: Negative.   HENT: Negative.   Eyes: Negative.   Respiratory: Negative.   Cardiovascular: Negative.   Gastrointestinal: Negative.   Endocrine: Negative.   Genitourinary: Negative.   Musculoskeletal: Negative.        Right foot swelling and pain - finished keflex   Skin: Negative.        Lesion = left wrist  Allergic/Immunologic: Negative.   Neurological: Negative.   Hematological: Negative.   Psychiatric/Behavioral: Negative.        Objective:   Physical Exam  Constitutional: She is oriented to person, place, and time. She appears well-developed and well-nourished. No distress.  The patient is pleasant and alert and looks great for her age of 42 years.  HENT:  Head: Normocephalic and atraumatic.  Right Ear: External ear normal.  Left Ear: External ear normal.  Nose: Nose normal.  Mouth/Throat: Oropharynx is clear and moist. No oropharyngeal exudate.  Eyes: Pupils are equal, round, and reactive to light. Conjunctivae and EOM are normal. Right eye exhibits no discharge. Left eye exhibits no discharge.  Neck: Normal range of motion. Neck supple. No thyromegaly present.  No bruits thyromegaly or anterior cervical adenopathy  Cardiovascular: Normal rate, regular rhythm, normal heart sounds and intact distal pulses. Exam reveals no gallop and no friction rub.  No murmur heard. The heart is regular at 72/min distal pulses were present but much weaker on the left side.  Pulmonary/Chest: Effort normal. No respiratory distress. She has wheezes. She has no rales.  Patient had scattered wheezes and diminished breath sounds bilaterally.  She had a tight cough.  She says this is normal for her.  Abdominal: Soft. Bowel sounds are normal. She exhibits  no mass. There is tenderness. No hernia.  Slight epigastric and left upper quadrant tenderness  Musculoskeletal: Normal range of motion. She exhibits no edema.  Lymphadenopathy:    She has no cervical adenopathy.  Neurological: She is alert and oriented to person, place, and time. She has normal reflexes. No cranial nerve deficit.  Skin: Skin is warm and dry. No rash noted.  Psychiatric: She has a normal mood and affect. Her behavior is normal. Judgment and thought content normal.  Mood and affect are normal.  Nursing note and vitals reviewed.  BP 120/65 (BP Location: Left Arm)   Pulse 68   Temp (!) 97 F (36.1 C) (Oral)   Ht '5\' 7"'  (1.702 m)   Wt 133 lb (60.3 kg)   BMI 20.83 kg/m         Assessment & Plan:  1. Essential hypertension -The blood pressure is good today and she will continue with current treatment - BMP8+EGFR - CBC with Differential/Platelet - Hepatic function panel  2. Pure hypercholesterolemia -Continue with current treatment pending results of lab work - CBC with  Differential/Platelet - Lipid panel  3. Vitamin D deficiency -Continue with vitamin D replacement pending results of lab work - CBC with Differential/Platelet - VITAMIN D 25 Hydroxy (Vit-D Deficiency, Fractures)  4. Type 2 diabetes mellitus with diabetic neuropathic arthropathy, without long-term current use of insulin ([place]) -Continue with aggressive therapeutic lifestyle changes including diet and exercise and current treatment pending results of A1c and lab work - CBC with Differential/Platelet - Bayer DCA Hb A1c Waived - Microalbumin / creatinine urine ratio  5. Gastroesophageal reflux disease, esophagitis presence not specified -Patient does have GERD and we cautioned her about taking ibuprofen and taking this at least 4 twice daily after breakfast and supper for 7 to 10 days and at the same time protecting her stomach with ranitidine before breakfast and supper. - CBC with  Differential/Platelet - Hepatic function panel  6. Vitamin B 12 deficiency - CBC with Differential/Platelet  7. COPD (chronic obstructive pulmonary disease) with acute bronchitis (H[facility]) -Continue to use inhalers including Pulmicort albuterol and Mucinex and drinking plenty of fluids and stay well-hydrated - budesonide (PULMICORT) 0.5 MG/2ML nebulizer solution; USE 1 VIAL IN NEBULIZER TWICE DAILY  Dispense: 2 mL; Refill: 11  8. Right foot pain -Take ibuprofen and do uric acid.  Only take ibuprofen for short period of time and protect stomach with Zantac while taking ibuprofen  9. Wound of left foot -And continue with Keflex 500 twice daily for 7 to 10 daysMupirocin ointment salt water soaks  10. Peripheral vascular insufficiency ([place]) -Follow-up with vascular surgeon as planned  11. Chronic obstructive pulmonary disease, unspecified COPD type ([place]) -Continue with inhalers  12. Atherosclerosis of native arteries of the extremities with ulceration ([place]) -Continue with statin drug and aggressive therapeutic lifestyle changes  13. COPD (chronic obstructive pulmonary disease) with chronic bronchitis ([facility]) -Continue with inhalers and avoid irritating environments as much as possible  Meds ordered this encounter  Medications  . budesonide (PULMICORT) 0.5 MG/2ML nebulizer solution    Sig: USE 1 VIAL IN NEBULIZER TWICE DAILY    Dispense:  2 mL    Refill:  11    Please consider 90 day supplies to promote better adherence  . Pitavastatin Calcium (LIVALO) 2 MG TABS    Sig: Take 1 tablet (2 mg total) by mouth every evening.    Dispense:  90 tablet    Refill:  3  . omega-3 acid ethyl esters (LOVAZA) 1 g capsule    Sig: Take 1 capsule (1 g total) by mouth daily.    Dispense:  90 capsule    Refill:  3  . levothyroxine (SYNTHROID, LEVOTHROID) 100 MCG tablet    Sig: Take 1 tablet (100 mcg total) by mouth daily before breakfast.    Dispense:  90 tablet    Refill:  3  . hydrochlorothiazide  (HYDRODIURIL) 25 MG tablet    Sig: Take 1 tablet (25 mg total) by mouth daily.    Dispense:  90 tablet    Refill:  3  . ferrous sulfate 325 (65 FE) MG tablet    Sig: Take 1 tablet (325 mg total) by mouth every Monday, Wednesday, and Friday.    Dispense:  15 tablet    Refill:  11  . albuterol (PROVENTIL HFA;VENTOLIN HFA) 108 (90 Base) MCG/ACT inhaler    Sig: Inhale 1 puff into the lungs every 4 (four) hours as needed for wheezing.    Dispense:  18 g    Refill:  11  . albuterol (PROVENTIL) (2.5 MG/3ML) 0.083%  nebulizer solution    Sig: USE ONE VIAL IN NEBULIZER EVERY 6 HOURS AS NEEDED    Dispense:  225 mL    Refill:  5    DX J44.9  . cephALEXin (KEFLEX) 500 MG capsule    Sig: Take 1 capsule (500 mg total) by mouth 2 (two) times daily.    Dispense:  20 capsule    Refill:  0  . mupirocin ointment (BACTROBAN) 2 %    Sig: Apply 1 application topically 2 (two) times daily.    Dispense:  22 g    Refill:  0   Patient Instructions                       Medicare Annual Wellness Visit  [facility] and the medical providers at [facility] strive to bring you the best medical care.  In doing so we not only want to address your current medical conditions and concerns but also to detect new conditions early and prevent illness, disease and health-related problems.    Medicare offers a yearly Wellness Visit which allows our clinical staff to assess your need for preventative services including immunizations, lifestyle education, counseling to decrease risk of preventable diseases and screening for fall risk and other medical concerns.    This visit is provided free of charge (no copay) for all Medicare recipients. The clinical pharmacists at [facility] have begun to conduct these Wellness Visits which will also include a thorough review of all your medications.    As you primary medical provider recommend that you make an appointment for your Annual  Wellness Visit if you have not done so already this year.  You may set up this appointment before you leave today or you may call back ([phone]) and schedule an appointment.  Please make sure when you call that you mention that you are scheduling your Annual Wellness Visit with the clinical pharmacist so that the appointment may be made for the proper length of time.     Continue current medications. Continue good therapeutic lifestyle changes which include good diet and exercise. Fall precautions discussed with patient. If an FOBT was given today- please return it to our front desk. If you are over 47 years old - you may need Prevnar 67 or the adult Pneumonia vaccine.  **Flu shots are available--- please call and schedule a FLU-CLINIC appointment**  After your visit with [place] today you will receive a survey in the mail or online from [employer] regarding your care with [place]. Please take a moment to fill this out. Your feedback is very important to [place] as you can help [place] better understand your patient needs as well as improve your experience and satisfaction. WE CARE ABOUT YOU!!!   Do warm salt water soaks 20 minutes twice daily for the next 7 to 10 days left foot  Cleanse the area and apply mupirocin ointment twice daily along with a dressing Do not pick at the wound Take antibiotic as directed until completed To help with the right foot take ibuprofen twice daily after breakfast and supper for the next 7 to 10 days as long as it does not cause increased indigestion and take a ranitidine or Zantac before breakfast and supper while taking the ibuprofen. Elevate foot when possible  Arrie Senate MD

## 2017-09-04 NOTE — Patient Instructions (Addendum)
Medicare Annual Wellness Visit  [facility] and the medical providers at [facility] strive to bring you the best medical care.  In doing so we not only want to address your current medical conditions and concerns but also to detect new conditions early and prevent illness, disease and health-related problems.    Medicare offers a yearly Wellness Visit which allows our clinical staff to assess your need for preventative services including immunizations, lifestyle education, counseling to decrease risk of preventable diseases and screening for fall risk and other medical concerns.    This visit is provided free of charge (no copay) for all Medicare recipients. The clinical pharmacists at [facility] have begun to conduct these Wellness Visits which will also include a thorough review of all your medications.    As you primary medical provider recommend that you make an appointment for your Annual Wellness Visit if you have not done so already this year.  You may set up this appointment before you leave today or you may call back ([phone]) and schedule an appointment.  Please make sure when you call that you mention that you are scheduling your Annual Wellness Visit with the clinical pharmacist so that the appointment may be made for the proper length of time.     Continue current medications. Continue good therapeutic lifestyle changes which include good diet and exercise. Fall precautions discussed with patient. If an FOBT was given today- please return it to our front desk. If you are over 78 years old - you may need Prevnar 71 or the adult Pneumonia vaccine.  **Flu shots are available--- please call and schedule a FLU-CLINIC appointment**  After your visit with [place] today you will receive a survey in the mail or online from [employer] regarding your care with [place]. Please take a moment to fill this out. Your feedback is very  important to [place] as you can help [place] better understand your patient needs as well as improve your experience and satisfaction. WE CARE ABOUT YOU!!!   Do warm salt water soaks 20 minutes twice daily for the next 7 to 10 days left foot  Cleanse the area and apply mupirocin ointment twice daily along with a dressing Do not pick at the wound Take antibiotic as directed until completed To help with the right foot take ibuprofen twice daily after breakfast and supper for the next 7 to 10 days as long as it does not cause increased indigestion and take a ranitidine or Zantac before breakfast and supper while taking the ibuprofen. Elevate foot when possible

## 2017-09-04 NOTE — Addendum Note (Signed)
Addended by: Zannie Cove on: 09/04/2017 11:27 AM   Modules accepted: Orders

## 2017-09-05 LAB — CBC WITH DIFFERENTIAL/PLATELET
BASOS ABS: 0.1 10*3/uL (ref 0.0–0.2)
Basos: 1 %
EOS (ABSOLUTE): 0.3 10*3/uL (ref 0.0–0.4)
Eos: 3 %
Hematocrit: 43.6 % (ref 34.0–46.6)
Hemoglobin: 14.2 g/dL (ref 11.1–15.9)
IMMATURE GRANS (ABS): 0 10*3/uL (ref 0.0–0.1)
Immature Granulocytes: 0 %
LYMPHS ABS: 3.4 10*3/uL — AB (ref 0.7–3.1)
LYMPHS: 32 %
MCH: 27.7 pg (ref 26.6–33.0)
MCHC: 32.6 g/dL (ref 31.5–35.7)
MCV: 85 fL (ref 79–97)
Monocytes Absolute: 0.6 10*3/uL (ref 0.1–0.9)
Monocytes: 6 %
Neutrophils Absolute: 6.4 10*3/uL (ref 1.4–7.0)
Neutrophils: 58 %
PLATELETS: 254 10*3/uL (ref 150–450)
RBC: 5.13 x10E6/uL (ref 3.77–5.28)
RDW: 14.3 % (ref 12.3–15.4)
WBC: 10.8 10*3/uL (ref 3.4–10.8)

## 2017-09-05 LAB — MICROALBUMIN / CREATININE URINE RATIO
Creatinine, Urine: 82.9 mg/dL
MICROALB/CREAT RATIO: 21.2 mg/g{creat} (ref 0.0–30.0)
Microalbumin, Urine: 17.6 ug/mL

## 2017-09-05 LAB — LIPID PANEL
Chol/HDL Ratio: 3.1 ratio (ref 0.0–4.4)
Cholesterol, Total: 157 mg/dL (ref 100–199)
HDL: 50 mg/dL
LDL Calculated: 84 mg/dL (ref 0–99)
Triglycerides: 113 mg/dL (ref 0–149)
VLDL Cholesterol Cal: 23 mg/dL (ref 5–40)

## 2017-09-05 LAB — BMP8+EGFR
BUN/Creatinine Ratio: 26 (ref 12–28)
BUN: 19 mg/dL (ref 8–27)
CALCIUM: 10.1 mg/dL (ref 8.7–10.3)
CHLORIDE: 100 mmol/L (ref 96–106)
CO2: 31 mmol/L — ABNORMAL HIGH (ref 20–29)
Creatinine, Ser: 0.73 mg/dL (ref 0.57–1.00)
GFR calc Af Amer: 87 mL/min/{1.73_m2} (ref >59)
GFR calc non Af Amer: 76 mL/min/{1.73_m2} (ref >59)
GLUCOSE: 93 mg/dL (ref 65–99)
POTASSIUM: 3.5 mmol/L (ref 3.5–5.2)
Sodium: 146 mmol/L — ABNORMAL HIGH (ref 134–144)

## 2017-09-05 LAB — HEPATIC FUNCTION PANEL
ALBUMIN: 4.4 g/dL (ref 3.5–4.7)
ALT: 6 IU/L (ref 0–32)
AST: 12 IU/L (ref 0–40)
Alkaline Phosphatase: 56 IU/L (ref 39–117)
BILIRUBIN TOTAL: 0.4 mg/dL (ref 0.0–1.2)
Bilirubin, Direct: 0.12 mg/dL (ref 0.00–0.40)
TOTAL PROTEIN: 6.9 g/dL (ref 6.0–8.5)

## 2017-09-05 LAB — VITAMIN D 25 HYDROXY (VIT D DEFICIENCY, FRACTURES): VIT D 25 HYDROXY: 72.9 ng/mL (ref 30.0–100.0)

## 2017-09-17 ENCOUNTER — Ambulatory Visit: Payer: Medicare Other | Admitting: Family Medicine

## 2017-09-17 ENCOUNTER — Encounter: Payer: Self-pay | Admitting: Family Medicine

## 2017-09-17 VITALS — BP 132/57 | HR 62 | Temp 96.9°F | Ht 67.0 in | Wt 131.0 lb

## 2017-09-17 DIAGNOSIS — E11610 Type 2 diabetes mellitus with diabetic neuropathic arthropathy: Secondary | ICD-10-CM | POA: Diagnosis not present

## 2017-09-17 DIAGNOSIS — J449 Chronic obstructive pulmonary disease, unspecified: Secondary | ICD-10-CM

## 2017-09-17 DIAGNOSIS — S91302A Unspecified open wound, left foot, initial encounter: Secondary | ICD-10-CM

## 2017-09-17 NOTE — Patient Instructions (Signed)
Continue to use Band-Aid on left lateral foot and have daughter check this periodically to make sure there is no further breakdown on the skin We emphasized the importance of keeping the feet checked regularly because of her diabetic history.

## 2017-09-17 NOTE — Progress Notes (Signed)
Subjective:    [name], female    DOB: 02-20-1932, 84 y.o.   MRN: [id number]  HPI the patient had an area of cellulitis previously and this was for a recheck today.  She also has an area on her left wrist that she would like to have cryotherapy on.   Patient Active Problem List   Diagnosis Date Noted  . Atherosclerosis of native arteries of the extremities with ulceration ([place]) 05/21/2016  . Non-healing wound of lower extremity 05/21/2016  . Type 2 diabetes mellitus treated without insulin ([place]) 03/11/2015  . Peripheral vascular insufficiency ([place]) 09/21/2013  . Hip pain 09/21/2013  . Osteoporosis, senile 07/15/2013  . Vitamin D deficiency   . Anemia, iron deficiency 08/22/2010  . GERD (gastroesophageal reflux disease) 08/22/2010  . Guaiac positive stools 08/22/2010  . BOWEN'S DISEASE 12/24/2008  . Hypothyroidism 12/24/2008  . HEMORRHOIDS 12/24/2008  . RECTAL BLEEDING 12/24/2008  . ARTHRITIS 12/24/2008  . NEOPLASM, MALIGNANT, ANAL CANAL, SQUAMOUS CELL 12/23/2008  . Hyperlipidemia 12/23/2008  . CARDIOMYOPATHY 12/23/2008  . CHF 12/23/2008  . COPD (chronic obstructive pulmonary disease) with chronic bronchitis ([place]) 12/23/2008  . HIATAL HERNIA 12/23/2008  . DIVERTICULOSIS, COLON 12/23/2008  . RENAL CYST 12/23/2008   Outpatient Encounter Medications as of 09/17/2017  Medication Sig  . acetaminophen (TYLENOL) 325 MG tablet Take 650 mg by mouth every 6 (six) hours as needed (for pain).   Marland Kitchen albuterol (PROVENTIL HFA;VENTOLIN HFA) 108 (90 Base) MCG/ACT inhaler Inhale 1 puff into the lungs every 4 (four) hours as needed for wheezing.  Marland Kitchen albuterol (PROVENTIL) (2.5 MG/3ML) 0.083% nebulizer solution USE ONE VIAL IN NEBULIZER EVERY 6 HOURS AS NEEDED  . aspirin EC 81 MG tablet Take 81 mg by mouth daily.  . budesonide (PULMICORT) 0.5 MG/2ML nebulizer solution USE 1 VIAL IN NEBULIZER TWICE DAILY  . CALCIUM PO Take 1 tablet by mouth every evening.  . Cholecalciferol  (VITAMIN D3) 1000 UNITS tablet Take 2,000 Units by mouth every evening.   . ferrous sulfate 325 (65 FE) MG tablet Take 1 tablet (325 mg total) by mouth every Monday, Wednesday, and Friday.  Marland Kitchen glucose blood (ONETOUCH VERIO) test strip USE TO CHECK GLUCOSE ONCE DAILY  . hydrochlorothiazide (HYDRODIURIL) 25 MG tablet Take 1 tablet (25 mg total) by mouth daily.  . hydroxypropyl methylcellulose / hypromellose (ISOPTO TEARS / GONIOVISC) 2.5 % ophthalmic solution Place 1 drop into both eyes 4 (four) times daily.  Marland Kitchen ketotifen (ALAWAY) 0.025 % ophthalmic solution Place 1 drop into both eyes 4 (four) times daily.  Marland Kitchen levothyroxine (SYNTHROID, LEVOTHROID) 100 MCG tablet Take 1 tablet (100 mcg total) by mouth daily before breakfast.  . meclizine (ANTIVERT) 25 MG tablet Take 25 mg by mouth 2 (two) times daily.  . Misc. Devices (HUGO ROLLING WALKER ELITE) MISC Use daily for gait instability  . mupirocin ointment (BACTROBAN) 2 % Apply 1 application topically 2 (two) times daily.  Marland Kitchen omega-3 acid ethyl esters (LOVAZA) 1 g capsule Take 1 capsule (1 g total) by mouth daily.  Glory Rosebush DELICA LANCETS FINE MISC Use  To check blood glucose once a day.  DX:  Type 2 DM E11.9  . Pitavastatin Calcium (LIVALO) 2 MG TABS Take 1 tablet (2 mg total) by mouth every evening.  . [DISCONTINUED] cephALEXin (KEFLEX) 500 MG capsule Take 1 capsule (500 mg total) by mouth 2 (two) times daily.   Facility-Administered Encounter Medications as of 09/17/2017  Medication  . cyanocobalamin ((VITAMIN B-12)) injection 1,000 mcg  Review of Systems  Constitutional: Negative.   HENT: Negative.   Eyes: Negative.   Respiratory: Negative.   Cardiovascular: Negative.   Gastrointestinal: Negative.   Endocrine: Negative.   Genitourinary: Negative.   Musculoskeletal: Negative.   Skin: Positive for wound (toe wound - left =better).  Allergic/Immunologic: Negative.   Neurological: Negative.   Hematological: Negative.     Psychiatric/Behavioral: Negative.        Objective:   Physical Exam  Constitutional: She is oriented to person, place, and time. She appears well-developed and well-nourished. No distress.  HENT:  Head: Normocephalic and atraumatic.  Eyes: Pupils are equal, round, and reactive to light. Conjunctivae and EOM are normal. Right eye exhibits no discharge. Left eye exhibits no discharge. No scleral icterus.  Neck: Normal range of motion.  Musculoskeletal: Normal range of motion.  Neurological: She is alert and oriented to person, place, and time.  Skin: Skin is warm and dry. No erythema.  The ulcerations that were on the lateral foot and plantar surface have healed and there is no sign of any drainage redness or infection today.  Psychiatric: She has a normal mood and affect. Her behavior is normal. Judgment and thought content normal.  Nursing note and vitals reviewed.  BP (!) 132/57 (BP Location: Left Arm)   Pulse 62   Temp (!) 96.9 F (36.1 C) (Oral)   Ht 5\' 7"  (1.702 m)   Wt 131 lb (59.4 kg)   BMI 20.52 kg/m         Assessment & Plan:  1. Wound of left foot -This is healed and is much better and antibiotic has been completed. -The patient will continue to use a Band-Aid over the area to keep from irritating it and rubbing it and the daughter will check on the wound periodically to make sure that it does not recur.  2. Type 2 diabetes mellitus with diabetic neuropathic arthropathy, without long-term current use of insulin ([place]) -Continue with diet and healthy eating  Patient Instructions  Continue to use Band-Aid on left lateral foot and have daughter check this periodically to make sure there is no further breakdown on the skin We emphasized the importance of keeping the feet checked regularly because of her diabetic history.  Arrie Senate MD

## 2017-09-20 ENCOUNTER — Ambulatory Visit: Payer: Medicare Other

## 2017-10-01 ENCOUNTER — Ambulatory Visit (INDEPENDENT_AMBULATORY_CARE_PROVIDER_SITE_OTHER): Payer: Medicare Other | Admitting: *Deleted

## 2017-10-01 DIAGNOSIS — E538 Deficiency of other specified B group vitamins: Secondary | ICD-10-CM | POA: Diagnosis not present

## 2017-10-01 NOTE — Patient Instructions (Signed)
Cyanocobalamin, Vitamin B12 injection What is this medicine? CYANOCOBALAMIN (sye an oh koe BAL a min) is a man made form of vitamin B12. Vitamin B12 is used in the growth of healthy blood cells, nerve cells, and proteins in the body. It also helps with the metabolism of fats and carbohydrates. This medicine is used to treat people who can not absorb vitamin B12. This medicine may be used for other purposes; ask your health care provider or pharmacist if you have questions. COMMON BRAND NAME(S): B-12 Compliance Kit, B-12 Injection Kit, Cyomin, LA-12, Nutri-Twelve, Physicians EZ Use B-12, Primabalt What should I tell my health care provider before I take this medicine? They need to know if you have any of these conditions: -kidney disease -Leber's disease -megaloblastic anemia -an unusual or allergic reaction to cyanocobalamin, cobalt, other medicines, foods, dyes, or preservatives -pregnant or trying to get pregnant -breast-feeding How should I use this medicine? This medicine is injected into a muscle or deeply under the skin. It is usually given by a health care professional in a clinic or doctor's office. However, your doctor may teach you how to inject yourself. Follow all instructions. Talk to your pediatrician regarding the use of this medicine in children. Special care may be needed. Overdosage: If you think you have taken too much of this medicine contact a poison control center or emergency room at once. NOTE: This medicine is only for you. Do not share this medicine with others. What if I miss a dose? If you are given your dose at a clinic or doctor's office, call to reschedule your appointment. If you give your own injections and you miss a dose, take it as soon as you can. If it is almost time for your next dose, take only that dose. Do not take double or extra doses. What may interact with this medicine? -colchicine -heavy alcohol intake This list may not describe all possible  interactions. Give your health care provider a list of all the medicines, herbs, non-prescription drugs, or dietary supplements you use. Also tell them if you smoke, drink alcohol, or use illegal drugs. Some items may interact with your medicine. What should I watch for while using this medicine? Visit your doctor or health care professional regularly. You may need blood work done while you are taking this medicine. You may need to follow a special diet. Talk to your doctor. Limit your alcohol intake and avoid smoking to get the best benefit. What side effects may I notice from receiving this medicine? Side effects that you should report to your doctor or health care professional as soon as possible: -allergic reactions like skin rash, itching or hives, swelling of the face, lips, or tongue -blue tint to skin -chest tightness, pain -difficulty breathing, wheezing -dizziness -red, swollen painful area on the leg Side effects that usually do not require medical attention (report to your doctor or health care professional if they continue or are bothersome): -diarrhea -headache This list may not describe all possible side effects. Call your doctor for medical advice about side effects. You may report side effects to FDA at 1-800-FDA-1088. Where should I keep my medicine? Keep out of the reach of children. Store at room temperature between 15 and 30 degrees C (59 and 85 degrees F). Protect from light. Throw away any unused medicine after the expiration date. NOTE: This sheet is a summary. It may not cover all possible information. If you have questions about this medicine, talk to your doctor, pharmacist, or   health care provider.  2018 Elsevier/Gold Standard (2007-06-16 22:10:20)  

## 2017-10-01 NOTE — Progress Notes (Signed)
B12 injection given and tolerated well.  

## 2017-10-07 ENCOUNTER — Other Ambulatory Visit: Payer: Self-pay

## 2017-10-07 ENCOUNTER — Ambulatory Visit (INDEPENDENT_AMBULATORY_CARE_PROVIDER_SITE_OTHER): Payer: Medicare Other | Admitting: Family

## 2017-10-07 ENCOUNTER — Encounter: Payer: Self-pay | Admitting: Family

## 2017-10-07 ENCOUNTER — Ambulatory Visit (INDEPENDENT_AMBULATORY_CARE_PROVIDER_SITE_OTHER)
Admission: RE | Admit: 2017-10-07 | Discharge: 2017-10-07 | Disposition: A | Discharge status: Home / Self Care | Payer: Medicare Other | Source: Ambulatory Visit | Attending: Surgery | Admitting: Surgery

## 2017-10-07 ENCOUNTER — Ambulatory Visit (HOSPITAL_COMMUNITY)
Admission: RE | Admit: 2017-10-07 | Discharge: 2017-10-07 | Disposition: A | Discharge status: Home / Self Care | Payer: Medicare Other | Source: Ambulatory Visit | Attending: Surgery | Admitting: Surgery

## 2017-10-07 VITALS — BP 132/51 | HR 58 | Temp 97.7°F | Resp 15 | Ht 67.5 in | Wt 130.9 lb

## 2017-10-07 DIAGNOSIS — X58XXXD Exposure to other specified factors, subsequent encounter: Secondary | ICD-10-CM | POA: Insufficient documentation

## 2017-10-07 DIAGNOSIS — I779 Disorder of arteries and arterioles, unspecified: Secondary | ICD-10-CM | POA: Diagnosis not present

## 2017-10-07 DIAGNOSIS — Z95828 Presence of other vascular implants and grafts: Secondary | ICD-10-CM

## 2017-10-07 DIAGNOSIS — F17200 Nicotine dependence, unspecified, uncomplicated: Secondary | ICD-10-CM | POA: Diagnosis not present

## 2017-10-07 DIAGNOSIS — I745 Embolism and thrombosis of iliac artery: Secondary | ICD-10-CM | POA: Diagnosis not present

## 2017-10-07 DIAGNOSIS — I7025 Atherosclerosis of native arteries of other extremities with ulceration: Secondary | ICD-10-CM

## 2017-10-07 DIAGNOSIS — S81802D Unspecified open wound, left lower leg, subsequent encounter: Secondary | ICD-10-CM

## 2017-10-07 NOTE — Patient Instructions (Signed)
Steps to Quit Smoking Smoking tobacco can be bad for your health. It can also affect almost every organ in your body. Smoking puts you and people around you at risk for many serious long-lasting (chronic) diseases. Quitting smoking is hard, but it is one of the best things that you can do for your health. It is never too late to quit. What are the benefits of quitting smoking? When you quit smoking, you lower your risk for getting serious diseases and conditions. They can include:  Lung cancer or lung disease.  Heart disease.  Stroke.  Heart attack.  Not being able to have children (infertility).  Weak bones (osteoporosis) and broken bones (fractures).  If you have coughing, wheezing, and shortness of breath, those symptoms may get better when you quit. You may also get sick less often. If you are pregnant, quitting smoking can help to lower your chances of having a baby of low birth weight. What can I do to help me quit smoking? Talk with your doctor about what can help you quit smoking. Some things you can do (strategies) include:  Quitting smoking totally, instead of slowly cutting back how much you smoke over a period of time.  Going to in-person counseling. You are more likely to quit if you go to many counseling sessions.  Using resources and support systems, such as: ? Online chats with a counselor. ? Phone quitlines. ? Printed self-help materials. ? Support groups or group counseling. ? Text messaging programs. ? Mobile phone apps or applications.  Taking medicines. Some of these medicines may have nicotine in them. If you are pregnant or breastfeeding, do not take any medicines to quit smoking unless your doctor says it is okay. Talk with your doctor about counseling or other things that can help you.  Talk with your doctor about using more than one strategy at the same time, such as taking medicines while you are also going to in-person counseling. This can help make  quitting easier. What things can I do to make it easier to quit? Quitting smoking might feel very hard at first, but there is a lot that you can do to make it easier. Take these steps:  Talk to your family and friends. Ask them to support and encourage you.  Call phone quitlines, reach out to support groups, or work with a counselor.  Ask people who smoke to not smoke around you.  Avoid places that make you want (trigger) to smoke, such as: ? Bars. ? Parties. ? Smoke-break areas at work.  Spend time with people who do not smoke.  Lower the stress in your life. Stress can make you want to smoke. Try these things to help your stress: ? Getting regular exercise. ? Deep-breathing exercises. ? Yoga. ? Meditating. ? Doing a body scan. To do this, close your eyes, focus on one area of your body at a time from head to toe, and notice which parts of your body are tense. Try to relax the muscles in those areas.  Download or buy apps on your mobile phone or tablet that can help you stick to your quit plan. There are many free apps, such as QuitGuide from the CDC (Centers for Disease Control and Prevention). You can find more support from smokefree.gov and other websites.  This information is not intended to replace advice given to you by your health care provider. Make sure you discuss any questions you have with your health care provider. Document Released: 12/30/2008 Document   Revised: 11/01/2015 Document Reviewed: 07/20/2014 Elsevier Interactive Patient Education  2018 Elsevier Inc.     Peripheral Vascular Disease Peripheral vascular disease (PVD) is a disease of the blood vessels that are not part of your heart and brain. A simple term for PVD is poor circulation. In most cases, PVD narrows the blood vessels that carry blood from your heart to the rest of your body. This can result in a decreased supply of blood to your arms, legs, and internal organs, like your stomach or kidneys.  However, it most often affects a person's lower legs and feet. There are two types of PVD.  Organic PVD. This is the more common type. It is caused by damage to the structure of blood vessels.  Functional PVD. This is caused by conditions that make blood vessels contract and tighten (spasm).  Without treatment, PVD tends to get worse over time. PVD can also lead to acute ischemic limb. This is when an arm or limb suddenly has trouble getting enough blood. This is a medical emergency. Follow these instructions at home:  Take medicines only as told by your doctor.  Do not use any tobacco products, including cigarettes, chewing tobacco, or electronic cigarettes. If you need help quitting, ask your doctor.  Lose weight if you are overweight, and maintain a healthy weight as told by your doctor.  Eat a diet that is low in fat and cholesterol. If you need help, ask your doctor.  Exercise regularly. Ask your doctor for some good activities for you.  Take good care of your feet. ? Wear comfortable shoes that fit well. ? Check your feet often for any cuts or sores. Contact a doctor if:  You have cramps in your legs while walking.  You have leg pain when you are at rest.  You have coldness in a leg or foot.  Your skin changes.  You are unable to get or have an erection (erectile dysfunction).  You have cuts or sores on your feet that are not healing. Get help right away if:  Your arm or leg turns cold and blue.  Your arms or legs become red, warm, swollen, painful, or numb.  You have chest pain or trouble breathing.  You suddenly have weakness in your face, arm, or leg.  You become very confused or you cannot speak.  You suddenly have a very bad headache.  You suddenly cannot see. This information is not intended to replace advice given to you by your health care provider. Make sure you discuss any questions you have with your health care provider. Document Released:  05/30/2009 Document Revised: 08/11/2015 Document Reviewed: 08/13/2013 Elsevier Interactive Patient Education  2017 Elsevier Inc.  

## 2017-10-07 NOTE — Progress Notes (Signed)
[facility] [place]   CC: Follow up peripheral artery occlusive disease  History of Present Illness Katie Wilkins is a 84 y.o. female who is s/p angioplasty of left common iliac artery for in stent stenosis on 09-11-16 by Dr. Trula Slade.   The patient has a history of an occluded left common iliac artery. This was recanalized and stented with a 7 mm Bard covered stent by Dr. Oneida Alar on 10-03-15. She was able to heal all of her wounds. She then had in-stent stenosis on ultrasound and required further evaluation.  She returns today for follow up.  Pt states she does not walk much, and has no barriers to walking, she has no claudication sx's with walking.  Pt states her blood pressure usually low, systolic in the 43'P per pt.   Pt denies Katie known hx of stroke or TIA. States she has CHF, no known MI. She no longer has a cardiologist. Pt daughter states her PCP requests EKG's for pt.   She denies Katie tingling, numbness, pain, cold sensation, or weakness in left upper extremity; left radial and brachial pulses are not palpable.     Diabetic: Yes, last A1C result on file was 6.0 on 09-04-17 Tobacco use: smoker  (1 ppd, started at age 14 yrs)  Pt meds include: Statin :Yes Betablocker: No ASA: Yes Other anticoagulants/antiplatelets: no    Past Medical History:  Diagnosis Date  . Abdominal bloating   . anemia iron deficiency   . Arthritis   . Bowen's disease   . Cardiomyopathy   . Cataract   . CHF (congestive heart failure) ([place])   . COPD (chronic obstructive pulmonary disease) ([place])   . Diabetes mellitus without complication ([place])   . Diarrhea   . Diverticulosis   . Heme + stool   . Hemorrhoids   . Hiatal hernia   . Hyperlipemia   . Mitral valve regurgitation   . Neoplasm    Malignant, anal canal, squamous cell  . Nodular goiter   . Osteoporosis   . Rectal bleeding   . Rectocele   . Renal cyst   . Vitamin D deficiency     Social  History Social History   Tobacco Use  . Smoking status: Current Every Day Smoker    Packs/day: 0.50    Years: 57.00    Pack years: 28.50    Types: Cigarettes    Start date: 03/19/1951  . Smokeless tobacco: Never Used  . Tobacco comment: Less than 1 pk per day  Substance Use Topics  . Alcohol use: No  . Drug use: No    Family History Family History  Problem Relation Age of Onset  . Diabetes Mother   . Alzheimer's disease Mother   . Diabetes Brother   . Alzheimer's disease Brother   . Colon cancer Neg Hx     Past Surgical History:  Procedure Laterality Date  . ABDOMINAL AORTOGRAM W/LOWER EXTREMITY N/A 09/11/2016   Procedure: Abdominal Aortogram w/Lower Extremity;  Surgeon: Serafina Mitchell, MD;  Location: [facility];  Service: Cardiovascular;  Laterality: N/A;  . ABDOMINAL HYSTERECTOMY     vaginal secondary to fibroids  . COLON SURGERY     excision of cancer  . EYE SURGERY    . HEMORRHOID SURGERY    . NASAL SEPTUM SURGERY    . PERIPHERAL VASCULAR BALLOON ANGIOPLASTY Left 09/11/2016   Procedure: Peripheral Vascular Balloon Angioplasty;  Surgeon: Serafina Mitchell, MD;  Location: [facility];  Service: Cardiovascular;  Laterality: Left;  COMMON ILIAC  . PERIPHERAL VASCULAR CATHETERIZATION N/A 09/23/2015   Procedure: Abdominal Aortogram;  Surgeon: Elam Dutch, MD;  Location: [facility];  Service: Cardiovascular;  Laterality: N/A;  . PERIPHERAL VASCULAR CATHETERIZATION Bilateral 09/23/2015   Procedure: Lower Extremity Angiography;  Surgeon: Elam Dutch, MD;  Location: [facility];  Service: Cardiovascular;  Laterality: Bilateral;  . PERIPHERAL VASCULAR CATHETERIZATION Left 09/23/2015   Procedure: Peripheral Vascular Intervention;  Surgeon: Elam Dutch, MD;  Location: [facility];  Service: Cardiovascular;  Laterality: Left;  common iliac stent  . SQUAMOUS CELL CARCINOMA EXCISION  1985   anus resected    Allergies  Allergen  Reactions  . Ace Inhibitors Cough  . Codeine Other (See Comments)    Sleepy and crazy    Current Outpatient Medications  Medication Sig Dispense Refill  . acetaminophen (TYLENOL) 325 MG tablet Take 650 mg by mouth every 6 (six) hours as needed (for pain).     Marland Kitchen albuterol (PROVENTIL HFA;VENTOLIN HFA) 108 (90 Base) MCG/ACT inhaler Inhale 1 puff into the lungs every 4 (four) hours as needed for wheezing. 18 g 11  . albuterol (PROVENTIL) (2.5 MG/3ML) 0.083% nebulizer solution USE ONE VIAL IN NEBULIZER EVERY 6 HOURS AS NEEDED 225 mL 5  . aspirin EC 81 MG tablet Take 81 mg by mouth daily.    . budesonide (PULMICORT) 0.5 MG/2ML nebulizer solution USE 1 VIAL IN NEBULIZER TWICE DAILY 2 mL 11  . CALCIUM PO Take 1 tablet by mouth every evening.    . Cholecalciferol (VITAMIN D3) 1000 UNITS tablet Take 2,000 Units by mouth every evening.     . ferrous sulfate 325 (65 FE) MG tablet Take 1 tablet (325 mg total) by mouth every Monday, Wednesday, and Friday. 15 tablet 11  . glucose blood (ONETOUCH VERIO) test strip USE TO CHECK GLUCOSE ONCE DAILY 100 each 9  . hydrochlorothiazide (HYDRODIURIL) 25 MG tablet Take 1 tablet (25 mg total) by mouth daily. 90 tablet 3  . levothyroxine (SYNTHROID, LEVOTHROID) 100 MCG tablet Take 1 tablet (100 mcg total) by mouth daily before breakfast. 90 tablet 3  . meclizine (ANTIVERT) 25 MG tablet Take 25 mg by mouth 2 (two) times daily.    . Misc. Devices (HUGO ROLLING WALKER ELITE) MISC Use daily for gait instability 1 each 0  . mupirocin ointment (BACTROBAN) 2 % Apply 1 application topically 2 (two) times daily. 22 g 0  . omega-3 acid ethyl esters (LOVAZA) 1 g capsule Take 1 capsule (1 g total) by mouth daily. 90 capsule 3  . ONETOUCH DELICA LANCETS FINE MISC Use  To check blood glucose once a day.  DX:  Type 2 DM E11.9 100 each 1  . Pitavastatin Calcium (LIVALO) 2 MG TABS Take 1 tablet (2 mg total) by mouth every evening. 90 tablet 3  . hydroxypropyl methylcellulose /  hypromellose (ISOPTO TEARS / GONIOVISC) 2.5 % ophthalmic solution Place 1 drop into both eyes 4 (four) times daily.    Marland Kitchen ketotifen (ALAWAY) 0.025 % ophthalmic solution Place 1 drop into both eyes 4 (four) times daily.     Current Facility-Administered Medications  Medication Dose Route Frequency Provider Last Rate Last Dose  . cyanocobalamin ((VITAMIN B-12)) injection 1,000 mcg  1,000 mcg Intramuscular Q30 days Chipper Herb, MD   1,000 mcg at 10/01/17 1110    ROS: See HPI for pertinent positives and negatives.   Physical Examination  Vitals:  10/07/17 0951  BP: (!) 132/51  Pulse: (!) 58  Resp: 15  Temp: 97.7 F (36.5 C)  TempSrc: Oral  SpO2: 92%  Weight: 130 lb 14.4 oz (59.4 kg)  Height: 5' 7.5" (1.715 m)   Body mass index is 20.2 kg/m.  General: A&O x 3, WDWN, elderly female. Gait: normal HENT: No gross abnormalities.  Eyes: PERRLA. Pulmonary: Respirations are non labored, CTAB, good air movement in all fields Cardiac: regular rhythm, no detected murmur.         Carotid Bruits Right Left   Negative Negative   Radial pulses: 2+ palpable right, not palpable left, left brachial pulse also not palpable.  Adominal aortic pulse is not palpable                         VASCULAR EXAM: Extremities without ischemic changes, without Gangrene; without open wounds.                                                                                                                                                       LE Pulses Right Left       FEMORAL  2+ palpable  2+ palpable        POPLITEAL  1+ palpable   not palpable       POSTERIOR TIBIAL  not palpable   not palpable        DORSALIS PEDIS      ANTERIOR TIBIAL 1+ palpable  not palpable    Abdomen: soft, NT, no palpable masses. Skin: no rashes, no cellulitis, no ulcers noted. Musculoskeletal: no muscle wasting or atrophy.      Neurologic: A&O X 3; appropriate affect, Sensation is normal; MOTOR FUNCTION:   moving all extremities equally, motor strength 5/5 throughout. Speech is fluent/normal. CN 2-12 intact. Psychiatric: Thought content is normal, mood appropriate for clinical situation.    ASSESSMENT: Katie Wilkins is a 84 y.o. female who is s/p angioplasty of left common iliac artery for in stent stenosis on 09-11-16 by Dr. Trula Slade.  She is also s/p stenting of occluded left common iliac artery on 10-03-15 with a 7 mm Bard covered stent by Dr. Oneida Alar.   Pt states she does not walk much, and has no barriers to walking, she has no claudication sx's with walking.  There are no signs of ischemia in her feet or legs.  Bilateral femoral pulses are 2+ palpable.   Her DM is well controlled but unfortunately she continues to smoke since age 23.    DATA  Bilateral Artoiliac Duplex (10-07-17):  No stenosis of the right CIA or EIA, all biphasic waveforms.  Highest velocity of 364 cm/s is at proximal stent (was 259 cm/s); all biphasic waveforms.  Highest velocity in right CIA system is is 208 cm/s in proximal  right EIA.  Increased stenosis in the left CIA stent compared to the exam on 04-08-17.    ABI (Date: 10/07/2017):  R:   ABI: 1.01 (was 0.93 on 04-08-17),   PT: mono  DP: tri  TBI:  0.48 (was not documented)  L:   ABI: 0.57 (was 0.56),   PT: mono  DP: mono  TBI: 0.33 (was not documented)  ABI improved on the right to normal with tri and monophasic waveforms.  ABI is stable on the left with moderate disease, monophasic waveforms.     PLAN: The patient was counseled re smoking cessation and given several free resources re smoking cessation.  Graduated walking program discussed and how to achieve.   Based on the patient's vascular studies and examination, and after discussing with Dr. Trula Slade worsening stenosis in the left ilac artery stent with no change in ABI's and no claudication symptoms,  pt will return to clinic in 6 months with ABI's and bilateral aortoiliac  duplex. I advised pt to notify us if she develops concerns re the circulation in her feet or legs.  I discussed in depth with the patient the nature of atherosclerosis, and emphasized the importance of maximal medical management including strict control of blood pressure, blood glucose, and lipid levels, obtaining regular exercise, and cessation of smoking.  The patient is aware that without maximal medical management the underlying atherosclerotic disease process will progress, limiting the benefit of Katie interventions.  The patient was given information about PAD including signs, symptoms, treatment, what symptoms should prompt the patient to seek immediate medical care, and risk reduction measures to take.  Clemon Chambers, RN, MSN, FNP-C [facility] Phone: [phone]  Clinic MD: Trula Slade  10/07/17 10:09 AM

## 2017-10-28 ENCOUNTER — Other Ambulatory Visit: Payer: Self-pay

## 2017-10-28 DIAGNOSIS — I745 Embolism and thrombosis of iliac artery: Secondary | ICD-10-CM

## 2017-10-28 DIAGNOSIS — I779 Disorder of arteries and arterioles, unspecified: Secondary | ICD-10-CM

## 2017-11-05 ENCOUNTER — Ambulatory Visit (INDEPENDENT_AMBULATORY_CARE_PROVIDER_SITE_OTHER): Payer: Medicare Other | Admitting: *Deleted

## 2017-11-05 DIAGNOSIS — E538 Deficiency of other specified B group vitamins: Secondary | ICD-10-CM

## 2017-11-05 NOTE — Progress Notes (Signed)
Pt given cyanocobalamin inj Tolerated well 

## 2017-11-29 DIAGNOSIS — J440 Chronic obstructive pulmonary disease with acute lower respiratory infection: Secondary | ICD-10-CM | POA: Diagnosis not present

## 2017-11-29 DIAGNOSIS — J449 Chronic obstructive pulmonary disease, unspecified: Secondary | ICD-10-CM | POA: Diagnosis not present

## 2017-12-03 IMAGING — DX DG CHEST 2V
2 series · 2 of 2 positions shown · non-contrast
Comparison: December 02, 2015 and November 06, 2012

CLINICAL DATA: Shortness of breath and cough

EXAM:
CHEST  2 VIEW

[chest pa]
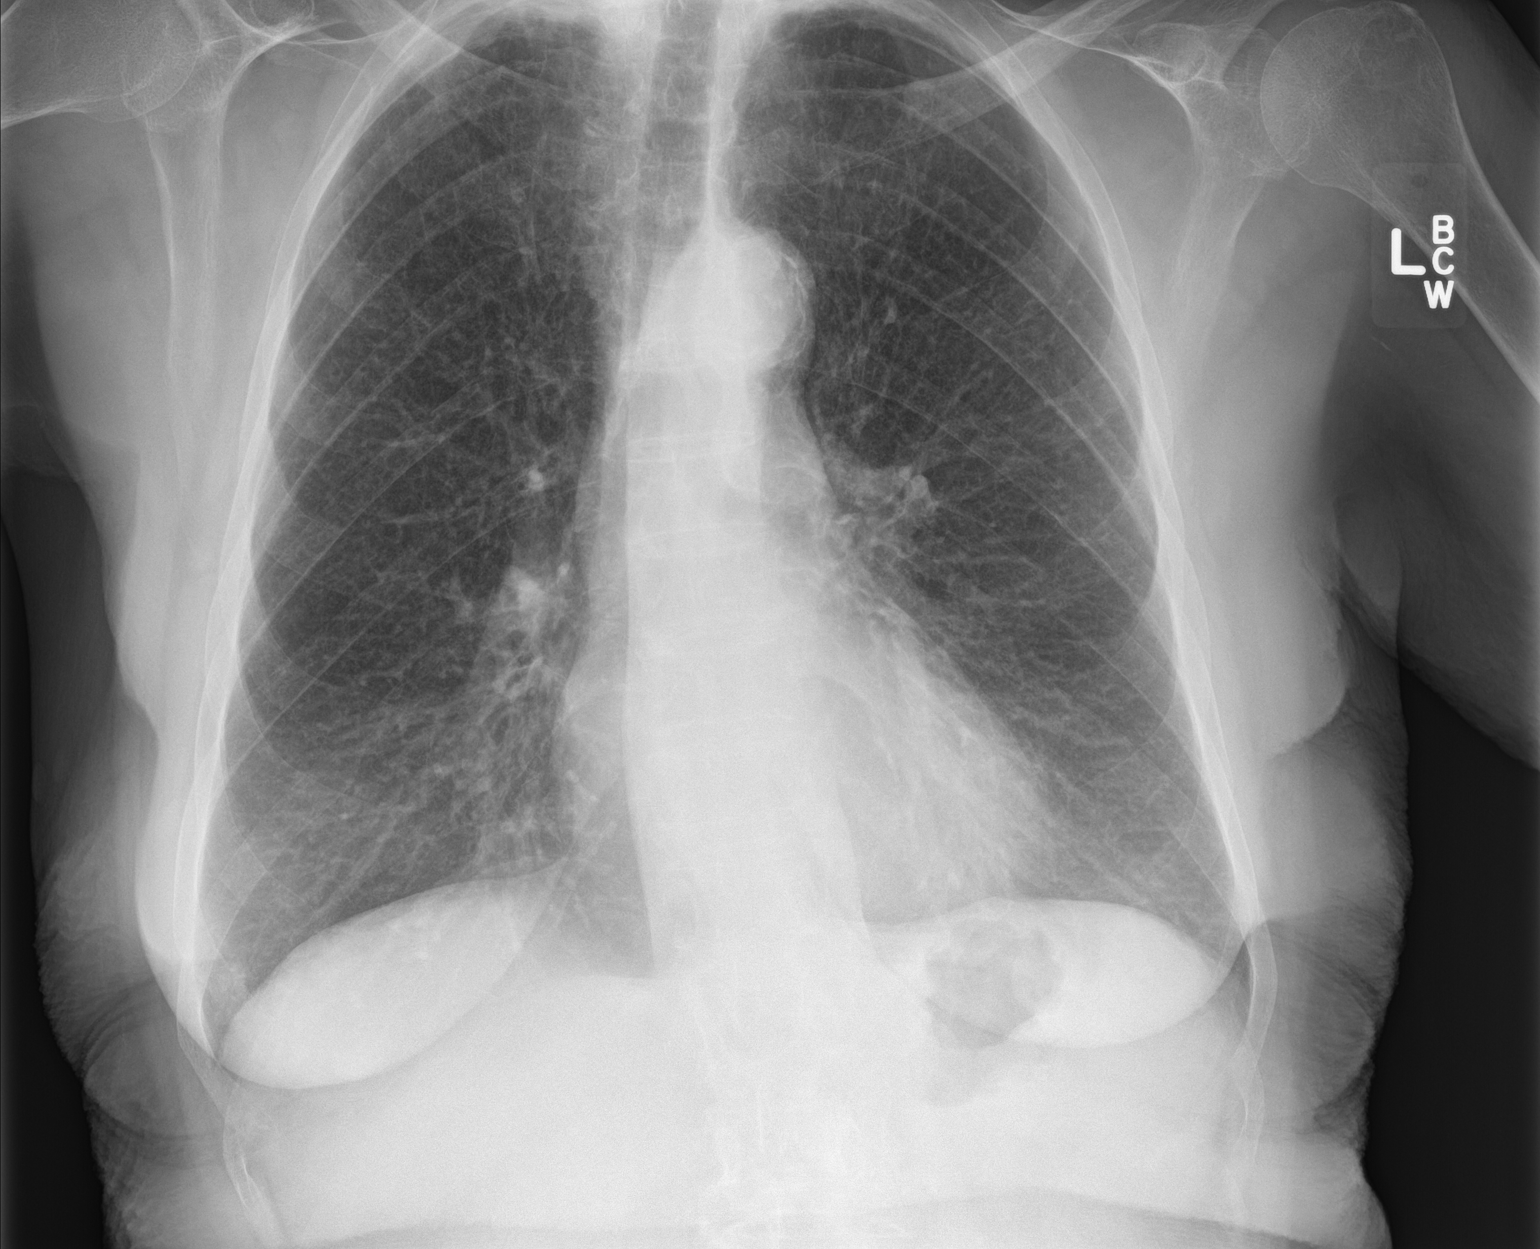

[chest lat]
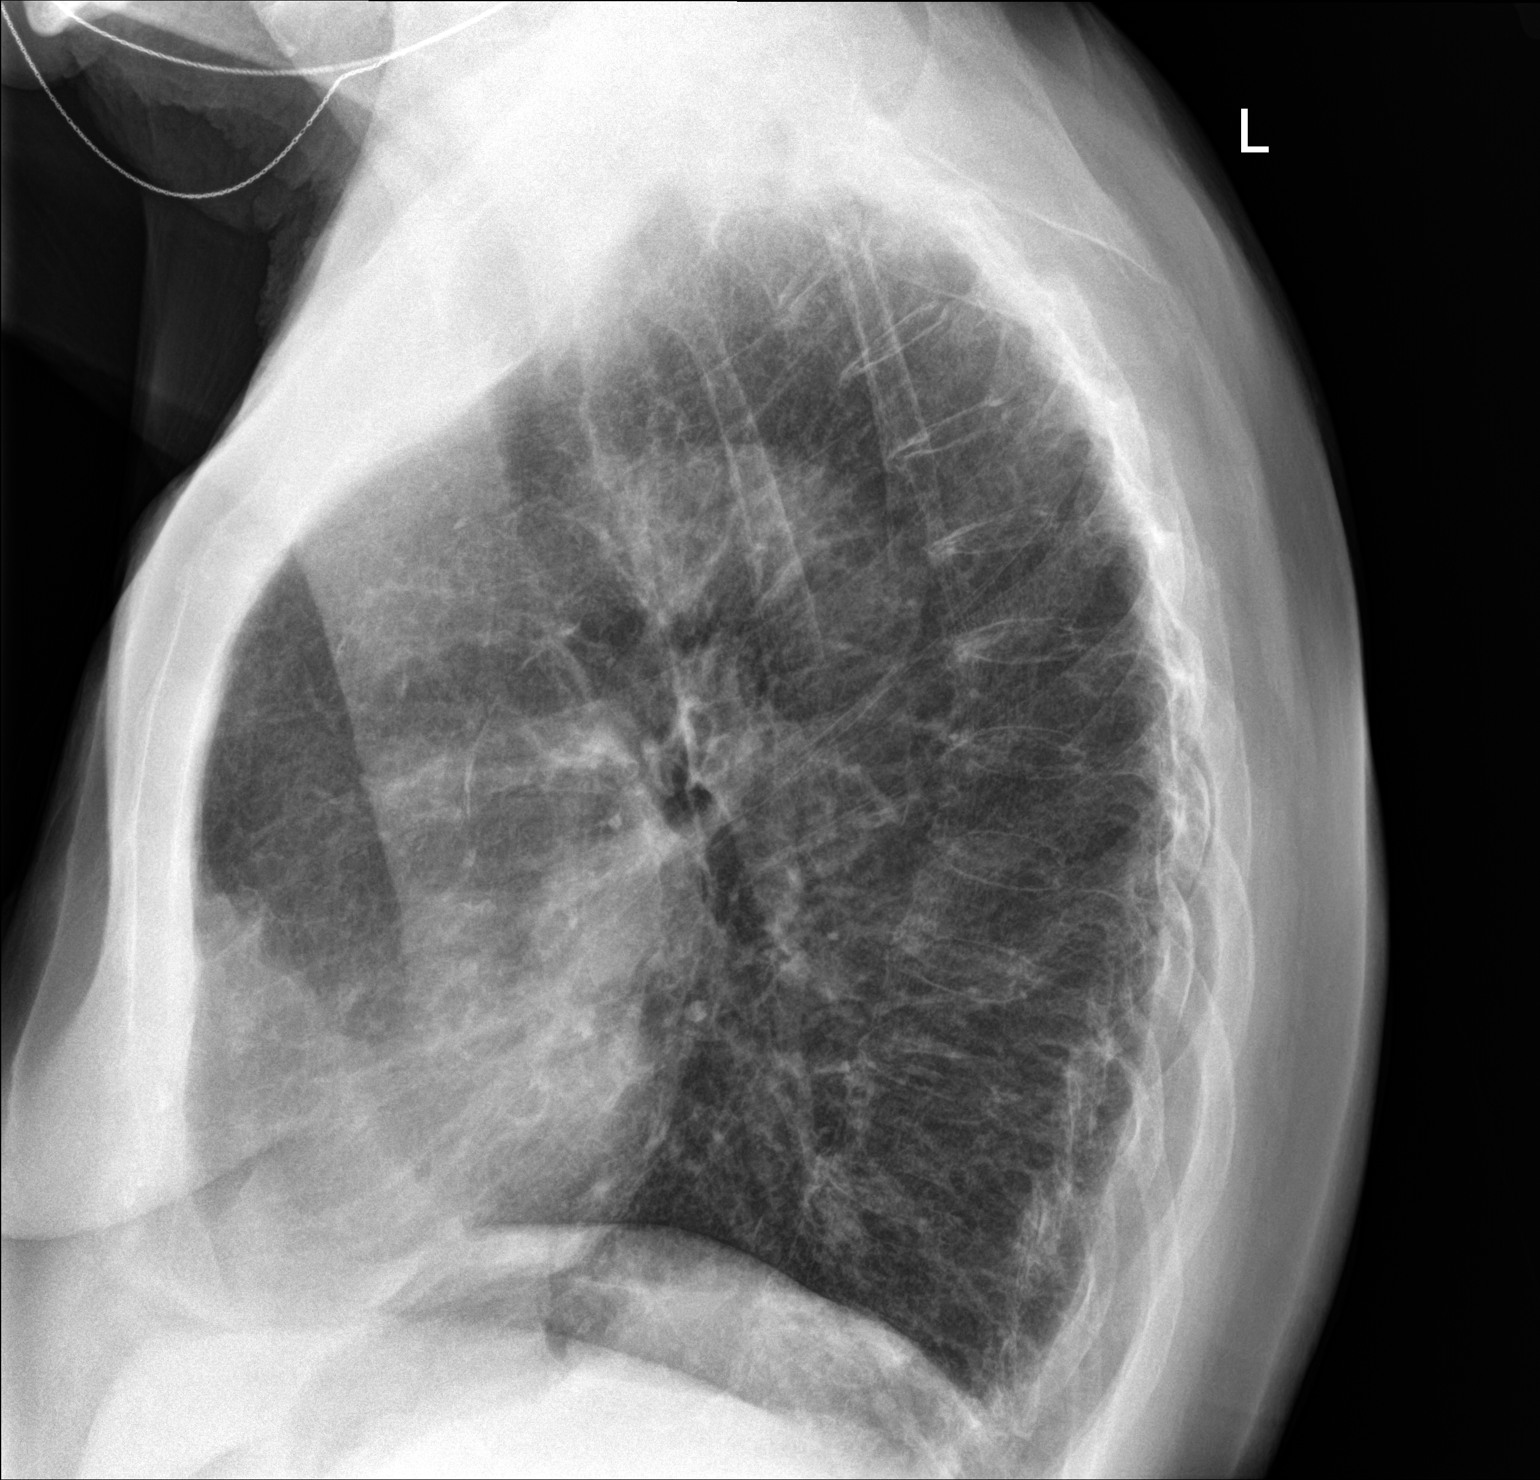

[2 of 2 positions shown; findings below may reference images not displayed]

FINDINGS: There is chronic interstitial prominence without frank edema or
consolidation. There is central peribronchial thickening, stable.
Heart size and pulmonary vascularity are normal. No adenopathy.
There is atherosclerotic calcification in the aorta. There are no
blastic or lytic bone lesions. Bones appear somewhat osteoporotic.
IMPRESSION: Diffuse interstitial thickening is stable, likely due to chronic
inflammatory type change/fibrotic change. There is chronic central
peribronchial thickening consistent with a degree of chronic
bronchitis. No frank edema or consolidation. Stable cardiac
silhouette. There is aortic atherosclerosis.

## 2017-12-09 ENCOUNTER — Ambulatory Visit: Payer: Medicare Other

## 2017-12-09 DIAGNOSIS — E119 Type 2 diabetes mellitus without complications: Secondary | ICD-10-CM | POA: Diagnosis not present

## 2017-12-10 ENCOUNTER — Ambulatory Visit (INDEPENDENT_AMBULATORY_CARE_PROVIDER_SITE_OTHER): Payer: Medicare Other | Admitting: *Deleted

## 2017-12-10 DIAGNOSIS — D508 Other iron deficiency anemias: Secondary | ICD-10-CM

## 2017-12-10 DIAGNOSIS — E538 Deficiency of other specified B group vitamins: Secondary | ICD-10-CM

## 2017-12-10 NOTE — Progress Notes (Signed)
Pt given Cyanocobalamin inj Tolerated well 

## 2018-01-10 ENCOUNTER — Ambulatory Visit (INDEPENDENT_AMBULATORY_CARE_PROVIDER_SITE_OTHER): Payer: Medicare Other | Admitting: *Deleted

## 2018-01-10 DIAGNOSIS — D508 Other iron deficiency anemias: Secondary | ICD-10-CM | POA: Diagnosis not present

## 2018-01-10 DIAGNOSIS — E538 Deficiency of other specified B group vitamins: Secondary | ICD-10-CM | POA: Diagnosis not present

## 2018-01-22 ENCOUNTER — Encounter: Payer: Self-pay | Admitting: Family Medicine

## 2018-01-22 ENCOUNTER — Ambulatory Visit: Payer: Medicare Other | Admitting: Family Medicine

## 2018-01-22 VITALS — BP 133/66 | HR 85 | Temp 97.0°F | Ht 67.5 in | Wt 136.0 lb

## 2018-01-22 DIAGNOSIS — Z23 Encounter for immunization: Secondary | ICD-10-CM | POA: Diagnosis not present

## 2018-01-22 DIAGNOSIS — E7800 Pure hypercholesterolemia, unspecified: Secondary | ICD-10-CM

## 2018-01-22 DIAGNOSIS — L820 Inflamed seborrheic keratosis: Secondary | ICD-10-CM

## 2018-01-22 DIAGNOSIS — I739 Peripheral vascular disease, unspecified: Secondary | ICD-10-CM

## 2018-01-22 DIAGNOSIS — E559 Vitamin D deficiency, unspecified: Secondary | ICD-10-CM

## 2018-01-22 DIAGNOSIS — I10 Essential (primary) hypertension: Secondary | ICD-10-CM

## 2018-01-22 DIAGNOSIS — K219 Gastro-esophageal reflux disease without esophagitis: Secondary | ICD-10-CM

## 2018-01-22 DIAGNOSIS — E538 Deficiency of other specified B group vitamins: Secondary | ICD-10-CM

## 2018-01-22 DIAGNOSIS — E11610 Type 2 diabetes mellitus with diabetic neuropathic arthropathy: Secondary | ICD-10-CM | POA: Diagnosis not present

## 2018-01-22 DIAGNOSIS — J449 Chronic obstructive pulmonary disease, unspecified: Secondary | ICD-10-CM

## 2018-01-22 LAB — BAYER DCA HB A1C WAIVED: HB A1C (BAYER DCA - WAIVED): 6.2 % (ref <7.0)

## 2018-01-22 NOTE — Patient Instructions (Addendum)
Medicare Annual Wellness Visit  [facility] and the medical providers at [facility] strive to bring you the best medical care.  In doing so we not only want to address your current medical conditions and concerns but also to detect new conditions early and prevent illness, disease and health-related problems.    Medicare offers a yearly Wellness Visit which allows our clinical staff to assess your need for preventative services including immunizations, lifestyle education, counseling to decrease risk of preventable diseases and screening for fall risk and other medical concerns.    This visit is provided free of charge (no copay) for all Medicare recipients. The clinical pharmacists at [facility] have begun to conduct these Wellness Visits which will also include a thorough review of all your medications.    As you primary medical provider recommend that you make an appointment for your Annual Wellness Visit if you have not done so already this year.  You may set up this appointment before you leave today or you may call back ([phone]) and schedule an appointment.  Please make sure when you call that you mention that you are scheduling your Annual Wellness Visit with the clinical pharmacist so that the appointment may be made for the proper length of time.     Continue current medications. Continue good therapeutic lifestyle changes which include good diet and exercise. Fall precautions discussed with patient. If an FOBT was given today- please return it to our front desk. If you are over 82 years old - you may need Prevnar 28 or the adult Pneumonia vaccine.  **Flu shots are available--- please call and schedule a FLU-CLINIC appointment**  After your visit with [place] today you will receive a survey in the mail or online from [employer] regarding your care with [place]. Please take a moment to fill this out. Your feedback is very  important to [place] as you can help [place] better understand your patient needs as well as improve your experience and satisfaction. WE CARE ABOUT YOU!!!   Continue to make every effort possible to stop smoking completely Continue to drink plenty of fluids, especially water. Continue with nebulizer treatment regularly and more often if more coughing and congestion Continue with Mucinex twice daily with a large glass of water indefinitely Follow-up with vascular surgeon as planned early next year

## 2018-01-22 NOTE — Progress Notes (Signed)
Subjective:    [name], female    DOB: [birth year], 84 y.o.   MRN: [id number]  HPI Pt here for follow up and management of chronic medical problems which includes diabetes, hypertension and hyperlipidemia. She is taking medication regularly.  The patient comes in today for her regular visit.  There are no complaints and no refills are necessary.  She will be given an FOBT to return get lab work today and get her flu shot today.  Patient is concerned about 2 lesions  on her left wrist which she would like to have frozen that are bothering her..  Patient has COPD.  She is using inhalers for this.  She is on vitamin D replacement iron replacement for anemia and takes HCTZ.  She is also taking Livalo for her cholesterol and on thyroid replacement 100 mcg daily.  She has a long term smoking history.  She has vascular insufficiency.  Patient is still smoking and eating chocolate.  She says that her blood sugars have been running in the 80-90 range fasting.  She also drinks caffeine.  Otherwise she is behaving well and her daughter confirms that in the office today.  She denies any chest pain or any more shortness of breath than usual.  She is using her nebulizer treatment with albuterol and Pulmicort usually once daily but more often if she is coughing and wheezing more.  She is also taking Mucinex twice daily on a regular basis.  She understands this is important to loosen congestion.  We did remind her again that she should stop smoking.  She denies any chest pain or any more shortness of breath than usual she denies any trouble with her stomach including nausea vomiting diarrhea blood in the stool.  She her stools are black because she is taking iron.  She does not go back and see the surgeon anymore because she did have multiple colonoscopies and everything was stable with that.  She is passing her water without problems.  She is up-to-date on her eye exams and goes yearly in  February.    Patient Active Problem List   Diagnosis Date Noted  . Atherosclerosis of native arteries of the extremities with ulceration ([place]) 05/21/2016  . Non-healing wound of lower extremity 05/21/2016  . Type 2 diabetes mellitus treated without insulin ([place]) 03/11/2015  . Peripheral vascular insufficiency ([place]) 09/21/2013  . Hip pain 09/21/2013  . Osteoporosis, senile 07/15/2013  . Vitamin D deficiency   . Anemia, iron deficiency 08/22/2010  . GERD (gastroesophageal reflux disease) 08/22/2010  . Guaiac positive stools 08/22/2010  . BOWEN'S DISEASE 12/24/2008  . Hypothyroidism 12/24/2008  . HEMORRHOIDS 12/24/2008  . RECTAL BLEEDING 12/24/2008  . ARTHRITIS 12/24/2008  . NEOPLASM, MALIGNANT, ANAL CANAL, SQUAMOUS CELL 12/23/2008  . Hyperlipidemia 12/23/2008  . CARDIOMYOPATHY 12/23/2008  . CHF 12/23/2008  . COPD (chronic obstructive pulmonary disease) with chronic bronchitis ([place]) 12/23/2008  . HIATAL HERNIA 12/23/2008  . DIVERTICULOSIS, COLON 12/23/2008  . RENAL CYST 12/23/2008   Outpatient Encounter Medications as of 01/22/2018  Medication Sig  . acetaminophen (TYLENOL) 325 MG tablet Take 650 mg by mouth every 6 (six) hours as needed (for pain).   Marland Kitchen albuterol (PROVENTIL HFA;VENTOLIN HFA) 108 (90 Base) MCG/ACT inhaler Inhale 1 puff into the lungs every 4 (four) hours as needed for wheezing.  Marland Kitchen albuterol (PROVENTIL) (2.5 MG/3ML) 0.083% nebulizer solution USE ONE VIAL IN NEBULIZER EVERY 6 HOURS AS NEEDED  . aspirin EC 81 MG tablet Take  81 mg by mouth daily.  . budesonide (PULMICORT) 0.5 MG/2ML nebulizer solution USE 1 VIAL IN NEBULIZER TWICE DAILY  . CALCIUM PO Take 1 tablet by mouth every evening.  . Cholecalciferol (VITAMIN D3) 1000 UNITS tablet Take 2,000 Units by mouth every evening.   . ferrous sulfate 325 (65 FE) MG tablet Take 1 tablet (325 mg total) by mouth every Monday, Wednesday, and Friday.  Marland Kitchen glucose blood (ONETOUCH VERIO) test strip USE TO CHECK GLUCOSE ONCE DAILY   . hydrochlorothiazide (HYDRODIURIL) 25 MG tablet Take 1 tablet (25 mg total) by mouth daily.  . hydroxypropyl methylcellulose / hypromellose (ISOPTO TEARS / GONIOVISC) 2.5 % ophthalmic solution Place 1 drop into both eyes 4 (four) times daily.  Marland Kitchen ketotifen (ALAWAY) 0.025 % ophthalmic solution Place 1 drop into both eyes 4 (four) times daily.  Marland Kitchen levothyroxine (SYNTHROID, LEVOTHROID) 100 MCG tablet Take 1 tablet (100 mcg total) by mouth daily before breakfast.  . meclizine (ANTIVERT) 25 MG tablet Take 25 mg by mouth 2 (two) times daily.  . Misc. Devices (HUGO ROLLING WALKER ELITE) MISC Use daily for gait instability  . mupirocin ointment (BACTROBAN) 2 % Apply 1 application topically 2 (two) times daily.  Marland Kitchen omega-3 acid ethyl esters (LOVAZA) 1 g capsule Take 1 capsule (1 g total) by mouth daily.  Glory Rosebush DELICA LANCETS FINE MISC Use  To check blood glucose once a day.  DX:  Type 2 DM E11.9  . Pitavastatin Calcium (LIVALO) 2 MG TABS Take 1 tablet (2 mg total) by mouth every evening.   Facility-Administered Encounter Medications as of 01/22/2018  Medication  . cyanocobalamin ((VITAMIN B-12)) injection 1,000 mcg     Review of Systems  Constitutional: Negative.   HENT: Negative.   Eyes: Negative.   Respiratory: Negative.   Cardiovascular: Negative.   Gastrointestinal: Negative.   Endocrine: Negative.   Genitourinary: Negative.   Musculoskeletal: Negative.   Skin: Negative.        Left wrist skin lesion   Allergic/Immunologic: Negative.   Neurological: Negative.   Hematological: Negative.   Psychiatric/Behavioral: Negative.        Objective:   Physical Exam  Constitutional: She is oriented to person, place, and time. She appears well-developed and well-nourished. No distress.  The patient is pleasant and alert and doing well considering her breathing condition and her ongoing smoking.  HENT:  Head: Normocephalic and atraumatic.  Right Ear: External ear normal.  Left Ear:  External ear normal.  Nose: Nose normal.  Mouth/Throat: Oropharynx is clear and moist. No oropharyngeal exudate.  Eyes: Pupils are equal, round, and reactive to light. Conjunctivae and EOM are normal. Right eye exhibits no discharge. Left eye exhibits no discharge. No scleral icterus.  Neck: Normal range of motion. Neck supple. No thyromegaly present.  No bruits thyromegaly or anterior cervical adenopathy  Cardiovascular: Normal rate.  No murmur heard. The heart was slightly irregular at 72/min.  There is no edema.  Pedal pulses were difficult to find in the left foot.  Pulmonary/Chest: Effort normal and breath sounds normal. She has no wheezes. She has no rales.  Breath sounds were good bilaterally and anteriorly and posteriorly.  She has a somewhat tight cough but no wheezes or rales.  Abdominal: Soft. Bowel sounds are normal. She exhibits no mass. There is no tenderness. There is no guarding.  No abdominal tenderness masses organ enlargement or inguinal adenopathy  Musculoskeletal: Normal range of motion. She exhibits no edema.  Lymphadenopathy:    She has  no cervical adenopathy.  Neurological: She is alert and oriented to person, place, and time. She has normal reflexes. No cranial nerve deficit.  Reflexes were equal bilaterally in the lower extremities and were minimal.  Skin: Skin is warm and dry. No rash noted.  Dry skin in general.  2 irritated seborrheic keratoses on left wrist were present.  Cryotherapy was performed on both of these lesions.  She tolerated procedure well.  Psychiatric: She has a normal mood and affect. Her behavior is normal. Judgment and thought content normal.  The patient's mood affect and behavior were normal and good and she was alert and responsive to all questions asked of her.  Nursing note and vitals reviewed.  BP 133/66 (BP Location: Left Arm)   Pulse 85   Temp (!) 97 F (36.1 C) (Oral)   Ht 5' 7.5" (1.715 m)   Wt 136 lb (61.7 kg)   BMI 20.99 kg/m          Assessment & Plan:  1. Type 2 diabetes mellitus with diabetic neuropathic arthropathy, without long-term current use of insulin (HCC) -Continue to check blood sugars at home and watch diet closely and stay as active as possible.  Continue current treatment pending results of lab work - CBC with Differential/Platelet - Bayer DCA Hb A1c Waived  2. Essential hypertension -The blood pressure was good today and she will continue with current treatment - BMP8+EGFR - CBC with Differential/Platelet - Hepatic function panel  3. Pure hypercholesterolemia -Continue with Livalo and omega-3 fatty acids and with diet as much as possible - CBC with Differential/Platelet - Lipid panel  4. Gastroesophageal reflux disease, esophagitis presence not specified -No complaints today with reflux. - CBC with Differential/Platelet  5. Vitamin B 12 deficiency -Continue with B12 replacement - CBC with Differential/Platelet  6. Vitamin D deficiency -Continue with vitamin D replacement - VITAMIN D 25 Hydroxy (Vit-D Deficiency, Fractures)  7. Peripheral vascular insufficiency ([place]) -Follow-up with vascular surgeon as planned  8. Chronic obstructive pulmonary disease, unspecified COPD type ([place]) -Continue with nebulizer treatments including albuterol and Pulmicort and increase the frequency of albuterol nebulizer treatment as needed for more trouble with breathing. -Continue to make all efforts at smoking cessation -Continue with Mucinex  Patient Instructions                       Medicare Annual Wellness Visit  [facility] and the medical providers at [facility] strive to bring you the best medical care.  In doing so we not only want to address your current medical conditions and concerns but also to detect new conditions early and prevent illness, disease and health-related problems.    Medicare offers a yearly Wellness Visit which allows our clinical staff to assess  your need for preventative services including immunizations, lifestyle education, counseling to decrease risk of preventable diseases and screening for fall risk and other medical concerns.    This visit is provided free of charge (no copay) for all Medicare recipients. The clinical pharmacists at [facility] have begun to conduct these Wellness Visits which will also include a thorough review of all your medications.    As you primary medical provider recommend that you make an appointment for your Annual Wellness Visit if you have not done so already this year.  You may set up this appointment before you leave today or you may call back ([phone]) and schedule an appointment.  Please make sure when  you call that you mention that you are scheduling your Annual Wellness Visit with the clinical pharmacist so that the appointment may be made for the proper length of time.     Continue current medications. Continue good therapeutic lifestyle changes which include good diet and exercise. Fall precautions discussed with patient. If an FOBT was given today- please return it to our front desk. If you are over 33 years old - you may need Prevnar 27 or the adult Pneumonia vaccine.  **Flu shots are available--- please call and schedule a FLU-CLINIC appointment**  After your visit with [place] today you will receive a survey in the mail or online from [employer] regarding your care with [place]. Please take a moment to fill this out. Your feedback is very important to [place] as you can help [place] better understand your patient needs as well as improve your experience and satisfaction. WE CARE ABOUT YOU!!!   Continue to make every effort possible to stop smoking completely Continue to drink plenty of fluids, especially water. Continue with nebulizer treatment regularly and more often if more coughing and congestion Continue with Mucinex twice daily with a large glass of water indefinitely Follow-up  with vascular surgeon as planned early next year  Arrie Senate MD

## 2018-01-23 LAB — BMP8+EGFR
BUN/Creatinine Ratio: 27 (ref 12–28)
BUN: 22 mg/dL (ref 8–27)
CALCIUM: 9.8 mg/dL (ref 8.7–10.3)
CO2: 29 mmol/L (ref 20–29)
CREATININE: 0.83 mg/dL (ref 0.57–1.00)
Chloride: 100 mmol/L (ref 96–106)
GFR calc Af Amer: 75 mL/min/{1.73_m2} (ref >59)
GFR, EST NON AFRICAN AMERICAN: 65 mL/min/{1.73_m2} (ref >59)
Glucose: 95 mg/dL (ref 65–99)
Potassium: 3.5 mmol/L (ref 3.5–5.2)
Sodium: 143 mmol/L (ref 134–144)

## 2018-01-23 LAB — VITAMIN D 25 HYDROXY (VIT D DEFICIENCY, FRACTURES): VIT D 25 HYDROXY: 56.4 ng/mL (ref 30.0–100.0)

## 2018-01-23 LAB — LIPID PANEL
CHOL/HDL RATIO: 3.4 ratio (ref 0.0–4.4)
Cholesterol, Total: 175 mg/dL (ref 100–199)
HDL: 52 mg/dL (ref >39)
LDL Calculated: 97 mg/dL (ref 0–99)
Triglycerides: 131 mg/dL (ref 0–149)
VLDL Cholesterol Cal: 26 mg/dL (ref 5–40)

## 2018-01-23 LAB — CBC WITH DIFFERENTIAL/PLATELET
BASOS: 1 %
Basophils Absolute: 0.1 10*3/uL (ref 0.0–0.2)
EOS (ABSOLUTE): 0.3 10*3/uL (ref 0.0–0.4)
EOS: 4 %
HEMATOCRIT: 39.3 % (ref 34.0–46.6)
Hemoglobin: 13.5 g/dL (ref 11.1–15.9)
Immature Grans (Abs): 0 10*3/uL (ref 0.0–0.1)
Immature Granulocytes: 0 %
LYMPHS ABS: 2.7 10*3/uL (ref 0.7–3.1)
Lymphs: 34 %
MCH: 28.4 pg (ref 26.6–33.0)
MCHC: 34.4 g/dL (ref 31.5–35.7)
MCV: 83 fL (ref 79–97)
Monocytes Absolute: 0.5 10*3/uL (ref 0.1–0.9)
Monocytes: 6 %
Neutrophils Absolute: 4.4 10*3/uL (ref 1.4–7.0)
Neutrophils: 55 %
Platelets: 207 10*3/uL (ref 150–450)
RBC: 4.76 x10E6/uL (ref 3.77–5.28)
RDW: 13.1 % (ref 12.3–15.4)
WBC: 8 10*3/uL (ref 3.4–10.8)

## 2018-01-23 LAB — HEPATIC FUNCTION PANEL
ALT: 8 IU/L (ref 0–32)
AST: 12 IU/L (ref 0–40)
Albumin: 4.3 g/dL (ref 3.5–4.7)
Alkaline Phosphatase: 43 IU/L (ref 39–117)
BILIRUBIN TOTAL: 0.3 mg/dL (ref 0.0–1.2)
Bilirubin, Direct: 0.11 mg/dL (ref 0.00–0.40)
Total Protein: 6.4 g/dL (ref 6.0–8.5)

## 2018-02-11 ENCOUNTER — Ambulatory Visit (INDEPENDENT_AMBULATORY_CARE_PROVIDER_SITE_OTHER): Payer: Medicare Other | Admitting: *Deleted

## 2018-02-11 DIAGNOSIS — D508 Other iron deficiency anemias: Secondary | ICD-10-CM

## 2018-02-11 DIAGNOSIS — E538 Deficiency of other specified B group vitamins: Secondary | ICD-10-CM

## 2018-02-11 NOTE — Progress Notes (Signed)
Pt given cyanocobalmin inj Tolerated well

## 2018-03-10 ENCOUNTER — Other Ambulatory Visit: Payer: Self-pay | Admitting: Family Medicine

## 2018-03-20 ENCOUNTER — Ambulatory Visit (INDEPENDENT_AMBULATORY_CARE_PROVIDER_SITE_OTHER): Payer: Medicare Other | Admitting: *Deleted

## 2018-03-20 DIAGNOSIS — D508 Other iron deficiency anemias: Secondary | ICD-10-CM

## 2018-03-20 DIAGNOSIS — E538 Deficiency of other specified B group vitamins: Secondary | ICD-10-CM

## 2018-03-20 NOTE — Progress Notes (Signed)
Pt given Cyanocobalamin inj Tolerated well 

## 2018-03-21 ENCOUNTER — Ambulatory Visit: Payer: Medicare Other | Admitting: Family

## 2018-03-21 ENCOUNTER — Encounter (HOSPITAL_COMMUNITY): Payer: Medicare Other

## 2018-03-28 DIAGNOSIS — E119 Type 2 diabetes mellitus without complications: Secondary | ICD-10-CM | POA: Diagnosis not present

## 2018-04-01 ENCOUNTER — Encounter: Payer: Self-pay | Admitting: Family

## 2018-04-01 ENCOUNTER — Ambulatory Visit (INDEPENDENT_AMBULATORY_CARE_PROVIDER_SITE_OTHER): Payer: Medicare Other | Admitting: Family

## 2018-04-01 ENCOUNTER — Other Ambulatory Visit: Payer: Self-pay

## 2018-04-01 ENCOUNTER — Ambulatory Visit (HOSPITAL_COMMUNITY)
Admission: RE | Admit: 2018-04-01 | Discharge: 2018-04-01 | Disposition: A | Discharge status: Home / Self Care | Payer: Medicare Other | Source: Ambulatory Visit | Attending: Surgery | Admitting: Surgery

## 2018-04-01 ENCOUNTER — Ambulatory Visit (INDEPENDENT_AMBULATORY_CARE_PROVIDER_SITE_OTHER)
Admission: RE | Admit: 2018-04-01 | Discharge: 2018-04-01 | Disposition: A | Discharge status: Home / Self Care | Payer: Medicare Other | Source: Ambulatory Visit | Attending: Surgery | Admitting: Surgery

## 2018-04-01 VITALS — BP 140/64 | HR 64 | Temp 97.1°F | Resp 20 | Ht 67.5 in | Wt 136.0 lb

## 2018-04-01 DIAGNOSIS — I779 Disorder of arteries and arterioles, unspecified: Secondary | ICD-10-CM

## 2018-04-01 DIAGNOSIS — F17200 Nicotine dependence, unspecified, uncomplicated: Secondary | ICD-10-CM | POA: Diagnosis not present

## 2018-04-01 DIAGNOSIS — I745 Embolism and thrombosis of iliac artery: Secondary | ICD-10-CM | POA: Diagnosis not present

## 2018-04-01 DIAGNOSIS — Z95828 Presence of other vascular implants and grafts: Secondary | ICD-10-CM

## 2018-04-01 NOTE — Progress Notes (Signed)
[facility]   CC: Follow up peripheral artery occlusive disease  History of Present Illness Katie Wilkins is a 85 y.o. female who is s/p angioplastyofleft common iliac artery forinstent stenosis on 09-11-16 by Dr. Trula Slade.  The patient has a history of an occluded left common iliac artery. Thiswasrecanalized and stented with a 7 mm Bard covered stentby Dr. Oneida Alar on 10-03-15. She was able to heal all of her wounds. Shethenhad in-stent stenosis on ultrasound andrequiredfurther evaluation.  She returns today for follow up.   Pt states she does not walk much, and has no barriers to walking, she has no claudication sx's with walking. Daughter states pt "is a picker, she scratches everywhere and forms sores in this way.   She is walking at least 30 minutes daily, trying to increase to an hour daily, and also using a seated pedaling device daily.  Pt denies any known hx of stroke or TIA. States she had CHF in 2006, no known MI. She no longer sees a cardiologist. Pt daughter states her PCP requests EKG's for pt.   She denies any tingling, numbness, pain, cold sensation, or weakness in left upper extremity; left radial and brachial pulses are not palpable.  At today's visit she denies chest pain or dyspnea. She denies feeling any more tired than usual.    Diabetic: Yes, 6.2 A1C on 01-22-18 (review of records) Tobacco use: smoker  (1 ppd, started at age 75 yrs)  Pt meds include: Statin :Yes Betablocker: No ASA: Yes Other anticoagulants/antiplatelets: no  Past Medical History:  Diagnosis Date  . Abdominal bloating   . anemia iron deficiency   . Arthritis   . Bowen's disease   . Cardiomyopathy   . Cataract   . CHF (congestive heart failure) ([place])   . COPD (chronic obstructive pulmonary disease) ([place])   . Diabetes mellitus without complication ([place])   . Diarrhea   . Diverticulosis   . Heme + stool   . Hemorrhoids   . Hiatal  hernia   . Hyperlipemia   . Mitral valve regurgitation   . Neoplasm    Malignant, anal canal, squamous cell  . Nodular goiter   . Osteoporosis   . Rectal bleeding   . Rectocele   . Renal cyst   . Vitamin D deficiency     Social History Social History   Tobacco Use  . Smoking status: Current Every Day Smoker    Packs/day: 0.50    Years: 57.00    Pack years: 28.50    Types: Cigarettes    Start date: 03/19/1953  . Smokeless tobacco: Never Used  . Tobacco comment: Less than 1 pk per day  Substance Use Topics  . Alcohol use: No  . Drug use: No    Family History Family History  Problem Relation Age of Onset  . Diabetes Mother   . Alzheimer's disease Mother   . Diabetes Brother   . Alzheimer's disease Brother   . Colon cancer Neg Hx     Past Surgical History:  Procedure Laterality Date  . ABDOMINAL AORTOGRAM W/LOWER EXTREMITY N/A 09/11/2016   Procedure: Abdominal Aortogram w/Lower Extremity;  Surgeon: Serafina Mitchell, MD;  Location: [facility];  Service: Cardiovascular;  Laterality: N/A;  . ABDOMINAL HYSTERECTOMY     vaginal secondary to fibroids  . COLON SURGERY     excision of cancer  . EYE SURGERY    . HEMORRHOID SURGERY    . NASAL SEPTUM  SURGERY    . PERIPHERAL VASCULAR BALLOON ANGIOPLASTY Left 09/11/2016   Procedure: Peripheral Vascular Balloon Angioplasty;  Surgeon: Serafina Mitchell, MD;  Location: [facility];  Service: Cardiovascular;  Laterality: Left;  COMMON ILIAC  . PERIPHERAL VASCULAR CATHETERIZATION N/A 09/23/2015   Procedure: Abdominal Aortogram;  Surgeon: Elam Dutch, MD;  Location: [facility];  Service: Cardiovascular;  Laterality: N/A;  . PERIPHERAL VASCULAR CATHETERIZATION Bilateral 09/23/2015   Procedure: Lower Extremity Angiography;  Surgeon: Elam Dutch, MD;  Location: [facility];  Service: Cardiovascular;  Laterality: Bilateral;  . PERIPHERAL VASCULAR CATHETERIZATION Left 09/23/2015   Procedure: Peripheral  Vascular Intervention;  Surgeon: Elam Dutch, MD;  Location: [facility];  Service: Cardiovascular;  Laterality: Left;  common iliac stent  . SQUAMOUS CELL CARCINOMA EXCISION  1985   anus resected    Allergies  Allergen Reactions  . Ace Inhibitors Cough  . Codeine Other (See Comments)    Sleepy and crazy    Current Outpatient Medications  Medication Sig Dispense Refill  . acetaminophen (TYLENOL) 325 MG tablet Take 650 mg by mouth every 6 (six) hours as needed (for pain).     Marland Kitchen albuterol (PROVENTIL HFA;VENTOLIN HFA) 108 (90 Base) MCG/ACT inhaler Inhale 1 puff into the lungs every 4 (four) hours as needed for wheezing. 18 g 11  . albuterol (PROVENTIL) (2.5 MG/3ML) 0.083% nebulizer solution USE ONE VIAL IN NEBULIZER EVERY 6 HOURS AS NEEDED 225 mL 5  . aspirin EC 81 MG tablet Take 81 mg by mouth daily.    . budesonide (PULMICORT) 0.5 MG/2ML nebulizer solution USE 1 VIAL IN NEBULIZER TWICE DAILY 2 mL 11  . CALCIUM PO Take 1 tablet by mouth every evening.    . Cholecalciferol (VITAMIN D3) 1000 UNITS tablet Take 2,000 Units by mouth every evening.     . ferrous sulfate 325 (65 FE) MG tablet Take 1 tablet (325 mg total) by mouth every Monday, Wednesday, and Friday. 15 tablet 11  . hydrochlorothiazide (HYDRODIURIL) 25 MG tablet Take 1 tablet (25 mg total) by mouth daily. 90 tablet 3  . hydroxypropyl methylcellulose / hypromellose (ISOPTO TEARS / GONIOVISC) 2.5 % ophthalmic solution Place 1 drop into both eyes 4 (four) times daily.    Marland Kitchen ketotifen (ALAWAY) 0.025 % ophthalmic solution Place 1 drop into both eyes 4 (four) times daily.    Marland Kitchen levothyroxine (SYNTHROID, LEVOTHROID) 100 MCG tablet Take 1 tablet (100 mcg total) by mouth daily before breakfast. 90 tablet 3  . meclizine (ANTIVERT) 25 MG tablet Take 25 mg by mouth 2 (two) times daily.    . Misc. Devices (HUGO ROLLING WALKER ELITE) MISC Use daily for gait instability 1 each 0  . mupirocin ointment (BACTROBAN) 2 % Apply 1 application  topically 2 (two) times daily. 22 g 0  . omega-3 acid ethyl esters (LOVAZA) 1 g capsule Take 1 capsule (1 g total) by mouth daily. 90 capsule 3  . ONETOUCH DELICA LANCETS FINE MISC Use  To check blood glucose once a day.  DX:  Type 2 DM E11.9 100 each 1  . ONETOUCH VERIO test strip USE 1 STRIP TO CHECK GLUCOSE ONCE DAILY 100 each 10  . Pitavastatin Calcium (LIVALO) 2 MG TABS Take 1 tablet (2 mg total) by mouth every evening. 90 tablet 3   Current Facility-Administered Medications  Medication Dose Route Frequency Provider Last Rate Last Dose  . cyanocobalamin ((VITAMIN B-12)) injection 1,000 mcg  1,000 mcg Intramuscular Q30  days Chipper Herb, MD   1,000 mcg at 03/20/18 1013    ROS: See HPI for pertinent positives and negatives.   Physical Examination  Vitals:   04/01/18 1011  BP: 140/64  Pulse: 64  Resp: 20  Temp: (!) 97.1 F (36.2 C)  SpO2: 92%  Weight: 136 lb (61.7 kg)  Height: 5' 7.5" (1.715 m)   Body mass index is 20.99 kg/m.  General: A&O x 3, WDWN, elderly female. Gait: normal HENT: No gross abnormalities.  Eyes: PERRLA. Pulmonary: Respirations are slightly labored at rest, limited air movement in all fields, no rales, rhonchi, or wheezes. Cardiac: Regular rhythm with frequent premature contractions, no detected murmur.         Carotid Bruits Right Left   Negative Negative   Radial pulses: right is 2+ palpable, left radial and brachial pulses are not palpable.    Adominal aortic pulse is not palpable                         VASCULAR EXAM: Extremities without ischemic changes, without Gangrene; without open wounds.                                                                                                          LE Pulses Right Left       FEMORAL  1+ palpable  2+ palpable        POPLITEAL  not palpable   not palpable       POSTERIOR TIBIAL  not palpable   not palpable        DORSALIS PEDIS      ANTERIOR TIBIAL 2+ palpable  not palpable     Abdomen: soft, NT, no palpable masses. Skin: no rashes, no cellulitis, no ulcers noted. Musculoskeletal: no muscle wasting or atrophy.  Neurologic: A&O X 3; appropriate affect, Sensation is normal; MOTOR FUNCTION:  moving all extremities equally, motor strength 5/5 throughout. Speech is fluent/normal. CN 2-12 intact. Psychiatric: Thought content is normal, mood appropriate for clinical situation.     ASSESSMENT: NEVAYA NAGELE is a 85 y.o. female who is s/pangioplastyofleft common iliac artery forinstent stenosis on 6-26-18by Dr. Trula Slade.  She is also s/p stenting of occluded leftcommon iliac arteryon 7-17-17with a 7 mm Bard covered stentby Dr. Oneida Alar.  Pt has increased her walking to 30-60 minutes daily. There are no signs of ischemia in her feet or legs.  Bilateral femoral pulses are palpable.   At her visit on 10-07-17 aortoiliac duplex indicated worsening stenosis in the left iliac artery stent with no change in ABI's and no claudication sx's, 6 months follow up was advised at that time.   Stenosis in the left iliac artery remains stable compared to 10-07-17, with no claudication sx's, no signs of ischemia I  Pt lower extremities. There has been a slight decline in left ABI's from 0.57 to 0.47.   Her DM is well controlled but unfortunately she continues to smoke since age 84. Over 3 minutes was spent counseling patient re smoking cessation,  and patient was given several free resources re smoking cessation.  Her cardiac rhythm is regular with frequent premature contractions; she seems asymptomatic of this, has no known arrhythmias. I advised pt and her daughter with her to speak with pt PCP, ask if she needs and ECG, or to be referred back to a cardiologist.     DATA  Bilateral Aortoiliac Duplex (04-01-18): Highest velocity is proximal to stent, 302 cm/s (was 364 cm/s on 10-07-17), monophasic waveforms at this segment, biphasic at mid and distal stent.  Right CIA  and EIA with no stenosis, triphasic waveforms.  Largest diameter of aorta measured was 3.12 cm at proximal segment.  Stable stenosis of the left CIA stent.   ABI (Date: 04/01/2018):  R:   ABI: 0.95 (was 1.01 on 10-07-17),   PT: bi (was mono)  DP: tri  TBI:  0.50, toe pressure 66 (was 0.48)  L:   ABI: 0.47 (was 0.57),   PT: mono  DP: mono  TBI: 0.37, toe pressure 49 (was 0.33) Right ABI remains normal with bi and triphasic waveforms. Slight decline in left ABI, from moderate to severe disease, waveforms remain monophasic. Stable bilateral TBI.    PLAN:  Based on the patient's vascular studies and examination, pt will return to clinic in 6 months with ABI's and bilateral aortoiliac duplex. I advised pt and her daughter to notify us of she develops concerns re the circulation in her feet or legs.   Continue walking at least 30 minutes daily in a safe environment.   I discussed in depth with the patient the nature of atherosclerosis, and emphasized the importance of maximal medical management including strict control of blood pressure, blood glucose, and lipid levels, obtaining regular exercise, and cessation of smoking.  The patient is aware that without maximal medical management the underlying atherosclerotic disease process will progress, limiting the benefit of any interventions.  The patient was given information about PAD including signs, symptoms, treatment, what symptoms should prompt the patient to seek immediate medical care, and risk reduction measures to take.  Katie Chambers, RN, MSN, FNP-C [facility] Phone: [phone]  Clinic MD: Bishop Dublin  04/01/18 7:46 PM

## 2018-04-01 NOTE — Patient Instructions (Signed)
Steps to Quit Smoking    Smoking tobacco can be bad for your health. It can also affect almost every organ in your body. Smoking puts you and people around you at risk for many serious long-lasting (chronic) diseases. Quitting smoking is hard, but it is one of the best things that you can do for your health. It is never too late to quit.  What are the benefits of quitting smoking?  When you quit smoking, you lower your risk for getting serious diseases and conditions. They can include:  · Lung cancer or lung disease.  · Heart disease.  · Stroke.  · Heart attack.  · Not being able to have children (infertility).  · Weak bones (osteoporosis) and broken bones (fractures).  If you have coughing, wheezing, and shortness of breath, those symptoms may get better when you quit. You may also get sick less often. If you are pregnant, quitting smoking can help to lower your chances of having a baby of low birth weight.  What can I do to help me quit smoking?  Talk with your doctor about what can help you quit smoking. Some things you can do (strategies) include:  · Quitting smoking totally, instead of slowly cutting back how much you smoke over a period of time.  · Going to in-person counseling. You are more likely to quit if you go to many counseling sessions.  · Using resources and support systems, such as:  ? Online chats with a counselor.  ? Phone quitlines.  ? Printed self-help materials.  ? Support groups or group counseling.  ? Text messaging programs.  ? Mobile phone apps or applications.  · Taking medicines. Some of these medicines may have nicotine in them. If you are pregnant or breastfeeding, do not take any medicines to quit smoking unless your doctor says it is okay. Talk with your doctor about counseling or other things that can help you.  Talk with your doctor about using more than one strategy at the same time, such as taking medicines while you are also going to in-person counseling. This can help make  quitting easier.  What things can I do to make it easier to quit?  Quitting smoking might feel very hard at first, but there is a lot that you can do to make it easier. Take these steps:  · Talk to your family and friends. Ask them to support and encourage you.  · Call phone quitlines, reach out to support groups, or work with a counselor.  · Ask people who smoke to not smoke around you.  · Avoid places that make you want (trigger) to smoke, such as:  ? Bars.  ? Parties.  ? Smoke-break areas at work.  · Spend time with people who do not smoke.  · Lower the stress in your life. Stress can make you want to smoke. Try these things to help your stress:  ? Getting regular exercise.  ? Deep-breathing exercises.  ? Yoga.  ? Meditating.  ? Doing a body scan. To do this, close your eyes, focus on one area of your body at a time from head to toe, and notice which parts of your body are tense. Try to relax the muscles in those areas.  · Download or buy apps on your mobile phone or tablet that can help you stick to your quit plan. There are many free apps, such as QuitGuide from the CDC (Centers for Disease Control and Prevention). You can find more   Document Reviewed: 07/20/2014 Elsevier Interactive Patient Education  2019 [place].     Peripheral Vascular Disease  Peripheral vascular disease (PVD) is a disease of the blood vessels that are not part of your heart and brain. A simple term for PVD is poor circulation. In most cases, PVD narrows the blood vessels that carry blood from your heart to the rest of your body. This can reduce the supply of blood to your arms, legs, and internal organs, like your stomach or kidneys. However, PVD most  often affects a person's lower legs and feet. Without treatment, PVD tends to get worse. PVD can also lead to acute ischemic limb. This is when an arm or leg suddenly cannot get enough blood. This is a medical emergency. Follow these instructions at home: Lifestyle  Do not use any products that contain nicotine or tobacco, such as cigarettes and e-cigarettes. If you need help quitting, ask your doctor.  Lose weight if you are overweight. Or, stay at a healthy weight as told by your doctor.  Eat a diet that is low in fat and cholesterol. If you need help, ask your doctor.  Exercise regularly. Ask your doctor for activities that are right for you. General instructions  Take over-the-counter and prescription medicines only as told by your doctor.  Take good care of your feet: ? Wear comfortable shoes that fit well. ? Check your feet often for any cuts or sores.  Keep all follow-up visits as told by your doctor This is important. Contact a doctor if:  You have cramps in your legs when you walk.  You have leg pain when you are at rest.  You have coldness in a leg or foot.  Your skin changes.  You are unable to get or have an erection (erectile dysfunction).  You have cuts or sores on your feet that do not heal. Get help right away if:  Your arm or leg turns cold, numb, and blue.  Your arms or legs become red, warm, swollen, painful, or numb.  You have chest pain.  You have trouble breathing.  You suddenly have weakness in your face, arm, or leg.  You become very confused or you cannot speak.  You suddenly have a very bad headache.  You suddenly cannot see. Summary  Peripheral vascular disease (PVD) is a disease of the blood vessels.  A simple term for PVD is poor circulation. Without treatment, PVD tends to get worse.  Treatment may include exercise, low fat and low cholesterol diet, and quitting smoking. This information is not intended to replace advice given to  you by your health care provider. Make sure you discuss any questions you have with your health care provider. Document Released: 05/30/2009 Document Revised: 04/12/2016 Document Reviewed: 04/12/2016 Elsevier Interactive Patient Education  2019 [employer].

## 2018-04-07 DIAGNOSIS — J440 Chronic obstructive pulmonary disease with acute lower respiratory infection: Secondary | ICD-10-CM | POA: Diagnosis not present

## 2018-04-07 DIAGNOSIS — J449 Chronic obstructive pulmonary disease, unspecified: Secondary | ICD-10-CM | POA: Diagnosis not present

## 2018-04-21 ENCOUNTER — Ambulatory Visit: Payer: Medicare Other

## 2018-04-22 ENCOUNTER — Ambulatory Visit (INDEPENDENT_AMBULATORY_CARE_PROVIDER_SITE_OTHER): Payer: Medicare Other | Admitting: *Deleted

## 2018-04-22 DIAGNOSIS — E538 Deficiency of other specified B group vitamins: Secondary | ICD-10-CM

## 2018-04-22 DIAGNOSIS — D508 Other iron deficiency anemias: Secondary | ICD-10-CM | POA: Diagnosis not present

## 2018-04-22 NOTE — Progress Notes (Signed)
Pt given cyanocobalamin inj Tolerated well 

## 2018-04-25 DIAGNOSIS — H2513 Age-related nuclear cataract, bilateral: Secondary | ICD-10-CM | POA: Diagnosis not present

## 2018-04-25 DIAGNOSIS — H40033 Anatomical narrow angle, bilateral: Secondary | ICD-10-CM | POA: Diagnosis not present

## 2018-05-21 ENCOUNTER — Ambulatory Visit (INDEPENDENT_AMBULATORY_CARE_PROVIDER_SITE_OTHER): Payer: Medicare Other | Admitting: *Deleted

## 2018-05-21 DIAGNOSIS — D508 Other iron deficiency anemias: Secondary | ICD-10-CM

## 2018-05-21 DIAGNOSIS — E538 Deficiency of other specified B group vitamins: Secondary | ICD-10-CM

## 2018-05-21 NOTE — Progress Notes (Signed)
Pt given Cyanocobalamin inj Tolerated well 

## 2018-06-02 ENCOUNTER — Telehealth: Payer: Self-pay

## 2018-06-02 ENCOUNTER — Telehealth: Payer: Self-pay | Admitting: Family Medicine

## 2018-06-02 MED ORDER — AMOXICILLIN 875 MG PO TABS: 875.0000 mg | ORAL_TABLET | Freq: Two times a day (BID) | ORAL | 0 refills | Status: DC

## 2018-06-02 NOTE — Telephone Encounter (Signed)
Patients daughter talked to triage nurse

## 2018-06-02 NOTE — Telephone Encounter (Signed)
Mom has a history of COPD and is experiencing chest congestion, some wheezing and cough.  Dr. Laurance Flatten normally will call in [place] when she starts feeling this way to deter any complications from COPD.  Daughter is calling to see if you will send something in to [facility].  Please advise.

## 2018-06-02 NOTE — Telephone Encounter (Signed)
Meds sent to pharmacy. Left detailed message on emergency contacts voicemail

## 2018-06-04 ENCOUNTER — Ambulatory Visit: Payer: Medicare Other | Admitting: Family Medicine

## 2018-06-06 ENCOUNTER — Other Ambulatory Visit: Payer: Self-pay | Admitting: *Deleted

## 2018-06-06 DIAGNOSIS — J440 Chronic obstructive pulmonary disease with acute lower respiratory infection: Secondary | ICD-10-CM | POA: Diagnosis not present

## 2018-06-06 MED ORDER — HYDROCHLOROTHIAZIDE 25 MG PO TABS: 25.0000 mg | ORAL_TABLET | Freq: Every day | ORAL | 3 refills | Status: DC

## 2018-06-06 NOTE — Progress Notes (Signed)
Pt requesting refill on HCTZ Refill sent in per pt request Ok per Dr Laurance Flatten

## 2018-06-09 DIAGNOSIS — J449 Chronic obstructive pulmonary disease, unspecified: Secondary | ICD-10-CM | POA: Diagnosis not present

## 2018-06-25 ENCOUNTER — Ambulatory Visit: Payer: Medicare Other

## 2018-07-09 ENCOUNTER — Telehealth: Payer: Self-pay | Admitting: *Deleted

## 2018-07-09 DIAGNOSIS — J440 Chronic obstructive pulmonary disease with acute lower respiratory infection: Secondary | ICD-10-CM

## 2018-07-09 DIAGNOSIS — J209 Acute bronchitis, unspecified: Secondary | ICD-10-CM

## 2018-07-09 MED ORDER — BUDESONIDE 0.5 MG/2ML IN SUSP: RESPIRATORY_TRACT | 3 refills | Status: DC

## 2018-07-09 NOTE — Telephone Encounter (Signed)
Received faxed refill request from [facility] for budesinide.  Medication refilled, and scheduled patient for televisit with Dr. Laurance Flatten on 4/30.

## 2018-07-17 ENCOUNTER — Encounter: Payer: Self-pay | Admitting: Family Medicine

## 2018-07-17 ENCOUNTER — Other Ambulatory Visit: Payer: Self-pay

## 2018-07-17 ENCOUNTER — Ambulatory Visit (INDEPENDENT_AMBULATORY_CARE_PROVIDER_SITE_OTHER): Payer: Medicare Other | Admitting: Family Medicine

## 2018-07-17 DIAGNOSIS — E782 Mixed hyperlipidemia: Secondary | ICD-10-CM

## 2018-07-17 DIAGNOSIS — E11610 Type 2 diabetes mellitus with diabetic neuropathic arthropathy: Secondary | ICD-10-CM

## 2018-07-17 DIAGNOSIS — J440 Chronic obstructive pulmonary disease with acute lower respiratory infection: Secondary | ICD-10-CM

## 2018-07-17 DIAGNOSIS — D509 Iron deficiency anemia, unspecified: Secondary | ICD-10-CM

## 2018-07-17 DIAGNOSIS — I739 Peripheral vascular disease, unspecified: Secondary | ICD-10-CM

## 2018-07-17 DIAGNOSIS — I10 Essential (primary) hypertension: Secondary | ICD-10-CM | POA: Diagnosis not present

## 2018-07-17 DIAGNOSIS — J209 Acute bronchitis, unspecified: Secondary | ICD-10-CM

## 2018-07-17 DIAGNOSIS — E559 Vitamin D deficiency, unspecified: Secondary | ICD-10-CM

## 2018-07-17 DIAGNOSIS — I7025 Atherosclerosis of native arteries of other extremities with ulceration: Secondary | ICD-10-CM

## 2018-07-17 DIAGNOSIS — E538 Deficiency of other specified B group vitamins: Secondary | ICD-10-CM | POA: Diagnosis not present

## 2018-07-17 DIAGNOSIS — K219 Gastro-esophageal reflux disease without esophagitis: Secondary | ICD-10-CM

## 2018-07-17 NOTE — Patient Instructions (Signed)
Continue with current treatment regimen Get lab work in the morning as planned Get B12 shot in the morning as planned Use MiraLAX in addition to Benefiber to see if this will help with constipation issues, if that does not work try Colace instead in addition to [employer]. Continue to practice good respiratory and hand hygiene Drink plenty of water and stay well-hydrated

## 2018-07-17 NOTE — Progress Notes (Signed)
Virtual Visit Via telephone Note I connected with@ on 07/17/18 by telephone and verified that I am speaking with the correct person or authorized healthcare agent using two identifiers. Katie Wilkins is currently located at home and there are no unauthorized people in close proximity. I completed this visit while in a private location in my home.  I connected to the patient by telephone and verified that I was speaking with the correct person.  Speak with the patient's daughter initially and she monitors her mother very closely and says her mother is doing great and is checking herself regularly at home and monitoring her blood sugars closely.  She says her blood pressures typically run low.  This visit type was conducted due to national recommendations for restrictions regarding the COVID-19 Pandemic (e.g. social distancing).  This format is felt to be most appropriate for this patient at this time.  All issues noted in this document were discussed and addressed.  No physical exam was performed.    I discussed the limitations, risks, security and privacy concerns of performing an evaluation and management service by telephone and the availability of in person appointments. I also discussed with the patient that there may be a patient responsible charge related to this service. The patient expressed understanding and agreed to proceed.   Date:  07/17/2018    ID:  Katie Wilkins      1931/07/08        [id number]   Patient Care Team Patient Care Team: Chipper Herb, MD as PCP - General (Family Medicine) Harlen Labs, MD as Referring Physician [facility])  Reason for Visit: Primary Care Follow-up     History of Present Illness & Review of Systems:     Katie Wilkins is a 85 y.o. year old female primary care patient that presents today for a telehealth visit.  I did speak to the daughter initially at a different location as she always stays and works with her mother very  closely on all of her medicines and at home readings.  When I spoke to the patient she was very alert and informed me about how she was feeling and that she is practicing good hand and pulmonary hygiene.  She denies any chest pain pressure tightness or shortness of breath.  She does have a no trouble with swallowing heartburn indigestion nausea vomiting diarrhea blood in the stool or black tarry bowel movements.  She does complain of constipation and is currently using Benefiber.  We will have her add some MiraLAX to the current treatment regimen and will discuss this with her daughter.  She is drinking some water during the day and has no trouble with passing her water.  She denies any burning pain or frequency.  She does complain of dry skin and says that her legs are doing well with no sores or infections.  She does have a pedal bike which she uses on a daily basis.  She gets her exercise this way.  Review of systems as stated otherwise negative for body systems left unmentioned.   The patient does not have symptoms concerning for COVID-19 infection (fever, chills, cough, or new shortness of breath).      Current Medications (Verified) Allergies as of 07/17/2018      Reactions   Ace Inhibitors Cough   Codeine Other (See Comments)   Sleepy and crazy      Medication List       Accurate  as of July 17, 2018  9:04 AM. Always use your most recent med list.        acetaminophen 325 MG tablet Commonly known as:  TYLENOL Take 650 mg by mouth every 6 (six) hours as needed (for pain).   [place] 0.025 % ophthalmic solution Generic drug:  ketotifen Place 1 drop into both eyes 4 (four) times daily.   albuterol 108 (90 Base) MCG/ACT inhaler Commonly known as:  VENTOLIN HFA Inhale 1 puff into the lungs every 4 (four) hours as needed for wheezing.   albuterol (2.5 MG/3ML) 0.083% nebulizer solution Commonly known as:  PROVENTIL USE ONE VIAL IN NEBULIZER EVERY 6 HOURS AS NEEDED   amoxicillin 875  MG tablet Commonly known as:  AMOXIL Take 1 tablet (875 mg total) by mouth 2 (two) times daily.   aspirin EC 81 MG tablet Take 81 mg by mouth daily.   budesonide 0.5 MG/2ML nebulizer solution Commonly known as:  PULMICORT USE 1 VIAL IN NEBULIZER TWICE DAILY   CALCIUM PO Take 1 tablet by mouth every evening.   cholecalciferol 25 MCG (1000 UT) tablet Commonly known as:  VITAMIN D Take 2,000 Units by mouth every evening.   ferrous sulfate 325 (65 FE) MG tablet Take 1 tablet (325 mg total) by mouth every Monday, Wednesday, and Friday.   [employer] Location manager Use daily for gait instability   hydrochlorothiazide 25 MG tablet Commonly known as:  HYDRODIURIL Take 1 tablet (25 mg total) by mouth daily.   hydroxypropyl methylcellulose / hypromellose 2.5 % ophthalmic solution Commonly known as:  ISOPTO TEARS / GONIOVISC Place 1 drop into both eyes 4 (four) times daily.   levothyroxine 100 MCG tablet Commonly known as:  SYNTHROID Take 1 tablet (100 mcg total) by mouth daily before breakfast.   meclizine 25 MG tablet Commonly known as:  ANTIVERT Take 25 mg by mouth 2 (two) times daily.   mupirocin ointment 2 % Commonly known as:  Bactroban Apply 1 application topically 2 (two) times daily.   omega-3 acid ethyl esters 1 g capsule Commonly known as:  Lovaza Take 1 capsule (1 g total) by mouth daily.   OneTouch Delica Lancets Fine Misc Use  To check blood glucose once a day.  DX:  Type 2 DM E11.9   OneTouch Verio test strip Generic drug:  glucose blood USE 1 STRIP TO CHECK GLUCOSE ONCE DAILY   Pitavastatin Calcium 2 MG Tabs Commonly known as:  Livalo Take 1 tablet (2 mg total) by mouth every evening.           Allergies (Verified)    Ace inhibitors and Codeine  Past Medical History Past Medical History:  Diagnosis Date  . Abdominal bloating   . anemia iron deficiency   . Arthritis   . Bowen's disease   . Cardiomyopathy   . Cataract   . CHF  (congestive heart failure) ([place])   . COPD (chronic obstructive pulmonary disease) ([place])   . Diabetes mellitus without complication ([place])   . Diarrhea   . Diverticulosis   . Heme + stool   . Hemorrhoids   . Hiatal hernia   . Hyperlipemia   . Mitral valve regurgitation   . Neoplasm    Malignant, anal canal, squamous cell  . Nodular goiter   . Osteoporosis   . Rectal bleeding   . Rectocele   . Renal cyst   . Vitamin D deficiency      Past Surgical History:  Procedure Laterality  Date  . ABDOMINAL AORTOGRAM W/LOWER EXTREMITY N/A 09/11/2016   Procedure: Abdominal Aortogram w/Lower Extremity;  Surgeon: Serafina Mitchell, MD;  Location: [place] [facility];  Service: Cardiovascular;  Laterality: N/A;  . ABDOMINAL HYSTERECTOMY     vaginal secondary to fibroids  . COLON SURGERY     excision of cancer  . EYE SURGERY    . HEMORRHOID SURGERY    . NASAL SEPTUM SURGERY    . PERIPHERAL VASCULAR BALLOON ANGIOPLASTY Left 09/11/2016   Procedure: Peripheral Vascular Balloon Angioplasty;  Surgeon: Serafina Mitchell, MD;  Location: [place] [facility];  Service: Cardiovascular;  Laterality: Left;  COMMON ILIAC  . PERIPHERAL VASCULAR CATHETERIZATION N/A 09/23/2015   Procedure: Abdominal Aortogram;  Surgeon: Elam Dutch, MD;  Location: [place] [facility];  Service: Cardiovascular;  Laterality: N/A;  . PERIPHERAL VASCULAR CATHETERIZATION Bilateral 09/23/2015   Procedure: Lower Extremity Angiography;  Surgeon: Elam Dutch, MD;  Location: [place] [facility];  Service: Cardiovascular;  Laterality: Bilateral;  . PERIPHERAL VASCULAR CATHETERIZATION Left 09/23/2015   Procedure: Peripheral Vascular Intervention;  Surgeon: Elam Dutch, MD;  Location: [place] [facility];  Service: Cardiovascular;  Laterality: Left;  common iliac stent  . SQUAMOUS CELL CARCINOMA EXCISION  1985   anus resected    Social History   Socioeconomic History  . Marital status: Married    Spouse name: Joni Fears  . Number of  children: 1  . Years of education: Not on file  . Highest education level: Not on file  Occupational History  . Occupation: Retired  Scientific laboratory technician  . Financial resource strain: Not on file  . Food insecurity:    Worry: Not on file    Inability: Not on file  . Transportation needs:    Medical: Not on file    Non-medical: Not on file  Tobacco Use  . Smoking status: Current Every Day Smoker    Packs/day: 0.50    Years: 57.00    Pack years: 28.50    Types: Cigarettes    Start date: 03/19/1953  . Smokeless tobacco: Never Used  . Tobacco comment: Less than 1 pk per day  Substance and Sexual Activity  . Alcohol use: No  . Drug use: No  . Sexual activity: Never  Lifestyle  . Physical activity:    Days per week: Not on file    Minutes per session: Not on file  . Stress: Not on file  Relationships  . Social connections:    Talks on phone: Not on file    Gets together: Not on file    Attends religious service: Not on file    Active member of club or organization: Not on file    Attends meetings of clubs or organizations: Not on file    Relationship status: Not on file  Other Topics Concern  . Not on file  Social History Narrative   Patient does not get regular exercise.     Family History  Problem Relation Age of Onset  . Diabetes Mother   . Alzheimer's disease Mother   . Diabetes Brother   . Alzheimer's disease Brother   . Colon cancer Neg Hx       Labs/Other Tests and Data Reviewed:    Wt Readings from Last 3 Encounters:  04/01/18 136 lb (61.7 kg)  01/22/18 136 lb (61.7 kg)  10/07/17 130 lb 14.4 oz (59.4 kg)   Temp Readings from Last 3 Encounters:  04/01/18 (!) 97.1 F (36.2  C)  01/22/18 (!) 97 F (36.1 C) (Oral)  10/07/17 97.7 F (36.5 C) (Oral)   BP Readings from Last 3 Encounters:  04/01/18 140/64  01/22/18 133/66  10/07/17 (!) 132/51   Pulse Readings from Last 3 Encounters:  04/01/18 64  01/22/18 85  10/07/17 (!) 58     Lab Results  Component  Value Date   HGBA1C 6.2 01/22/2018   HGBA1C 6.0 09/04/2017   HGBA1C 5.7 04/30/2017   Lab Results  Component Value Date   LDLCALC 97 01/22/2018   CREATININE 0.83 01/22/2018       Chemistry      Component Value Date/Time   NA 143 01/22/2018 1136   K 3.5 01/22/2018 1136   CL 100 01/22/2018 1136   CO2 29 01/22/2018 1136   BUN 22 01/22/2018 1136   CREATININE 0.83 01/22/2018 1136   CREATININE 0.62 10/09/2012 1132   GLU 153 04/01/2012      Component Value Date/Time   CALCIUM 9.8 01/22/2018 1136   ALKPHOS 43 01/22/2018 1136   AST 12 01/22/2018 1136   ALT 8 01/22/2018 1136   BILITOT 0.3 01/22/2018 1136         OBSERVATIONS/ OBJECTIVE:     Patient is alert and continues to exercise regularly on her pedal bike.  She does not get out but very little and when she does get out she wears a mask and gloves.  Her daughter keeps a close tab on her.  The patient checks her blood sugars at home and she says they have been running in the 90s and this includes fasting blood sugars and during the day blood sugars.  Her weight is stable.  Her blood pressure runs low anywhere from 80-100 over the 60s consistently.  Physical exam deferred due to nature of telephonic visit.  ASSESSMENT & PLAN    Time:   Today, I have spent 21 minutes with the patient via telephone discussing the above including Covid precautions.     Visit Diagnoses: 1. COPD (chronic obstructive pulmonary disease) with acute bronchitis ([place]) -The patient is breathing well and feeling well and using her nebulizer treatment and albuterol on a regular basis and this seems to be working well for her.  She continue with these treatments.  2. Type 2 diabetes mellitus with diabetic neuropathic arthropathy, without long-term current use of insulin ([place]) -Patient checks her blood sugars regularly and she will continue with her current treatment regimen and will get lab work in the morning.  3. Vitamin B 12 deficiency -B12 shot is  past due and she will come by the office to get her B12 shot in the morning when she gets her lab work.  4. Essential hypertension -Blood pressures at home continue to run on the low side between 80 and 100 over the 60s.  5. Mixed hyperlipidemia -She will continue with her current treatment pending results of lab work  6. Peripheral vascular insufficiency (HCC) -She is exercising regularly with her pedal bike and not having any discomfort in her legs.  She has no sores that are present currently.  She does have peripheral vascular insufficiency and has had wounds in her legs in the past which have been hard to heal.  7. Vitamin D deficiency -Continue with vitamin D replacement pending results of lab work  8. Gastroesophageal reflux disease, esophagitis presence not specified -Continue with antireflux measures continue coated baby aspirin after eating  9. Atherosclerosis of native arteries of the extremities with ulceration ([place]) -Continue  with statin therapy that she is currently taking which is Livalo.  She does tolerate this medication.  10. Iron deficiency anemia, unspecified iron deficiency anemia type -Continue with iron replacement and make sure we get CBC on blood work to be done tomorrow.  11.  Constipation -Add MiraLAX to current treatment regimen -Drink more water  Add MiraLAX to current treatment regimen for constipation Get lab work in the morning including B12 level CBC cholesterol and hemoglobin A1c  Patient Instructions  Continue with current treatment regimen Get lab work in the morning as planned Get B12 shot in the morning as planned Use MiraLAX in addition to Benefiber to see if this will help with constipation issues, if that does not work try Colace instead in addition to [employer]. Continue to practice good respiratory and hand hygiene Drink plenty of water and stay well-hydrated     The above assessment and management plan was discussed with the  patient. The patient verbalized understanding of and has agreed to the management plan. Patient is aware to call the clinic if symptoms persist or worsen. Patient is aware when to return to the clinic for a follow-up visit. Patient educated on when it is appropriate to go to the emergency department.    Chipper Herb, MD [address], [place],  [id number] Ph [phone]   Arrie Senate MD

## 2018-07-17 NOTE — Addendum Note (Signed)
Addended by: Zannie Cove on: 07/17/2018 11:44 AM   Modules accepted: Orders

## 2018-07-18 ENCOUNTER — Other Ambulatory Visit (INDEPENDENT_AMBULATORY_CARE_PROVIDER_SITE_OTHER): Payer: Medicare Other

## 2018-07-18 ENCOUNTER — Other Ambulatory Visit: Payer: Self-pay

## 2018-07-18 DIAGNOSIS — D508 Other iron deficiency anemias: Secondary | ICD-10-CM | POA: Diagnosis not present

## 2018-07-18 DIAGNOSIS — K219 Gastro-esophageal reflux disease without esophagitis: Secondary | ICD-10-CM | POA: Diagnosis not present

## 2018-07-18 DIAGNOSIS — E559 Vitamin D deficiency, unspecified: Secondary | ICD-10-CM

## 2018-07-18 DIAGNOSIS — I10 Essential (primary) hypertension: Secondary | ICD-10-CM

## 2018-07-18 DIAGNOSIS — E538 Deficiency of other specified B group vitamins: Secondary | ICD-10-CM

## 2018-07-18 DIAGNOSIS — E11610 Type 2 diabetes mellitus with diabetic neuropathic arthropathy: Secondary | ICD-10-CM | POA: Diagnosis not present

## 2018-07-18 DIAGNOSIS — E782 Mixed hyperlipidemia: Secondary | ICD-10-CM

## 2018-07-18 DIAGNOSIS — I7025 Atherosclerosis of native arteries of other extremities with ulceration: Secondary | ICD-10-CM

## 2018-07-18 DIAGNOSIS — D509 Iron deficiency anemia, unspecified: Secondary | ICD-10-CM

## 2018-07-18 LAB — BAYER DCA HB A1C WAIVED: HB A1C (BAYER DCA - WAIVED): 6 % (ref <7.0)

## 2018-07-18 NOTE — Progress Notes (Signed)
Pt given Cyanocobalamin inj Tolerated well 

## 2018-07-19 LAB — HEPATIC FUNCTION PANEL
ALT: 8 IU/L (ref 0–32)
AST: 16 IU/L (ref 0–40)
Albumin: 4.5 g/dL (ref 3.6–4.6)
Alkaline Phosphatase: 46 IU/L (ref 39–117)
Bilirubin Total: 0.3 mg/dL (ref 0.0–1.2)
Bilirubin, Direct: 0.1 mg/dL (ref 0.00–0.40)
Total Protein: 6.6 g/dL (ref 6.0–8.5)

## 2018-07-19 LAB — CBC WITH DIFFERENTIAL/PLATELET
Basophils Absolute: 0.1 10*3/uL (ref 0.0–0.2)
Basos: 1 %
EOS (ABSOLUTE): 0.4 10*3/uL (ref 0.0–0.4)
Eos: 5 %
Hematocrit: 42.3 % (ref 34.0–46.6)
Hemoglobin: 14.2 g/dL (ref 11.1–15.9)
Immature Grans (Abs): 0 10*3/uL (ref 0.0–0.1)
Immature Granulocytes: 0 %
Lymphocytes Absolute: 2.4 10*3/uL (ref 0.7–3.1)
Lymphs: 30 %
MCH: 28.5 pg (ref 26.6–33.0)
MCHC: 33.6 g/dL (ref 31.5–35.7)
MCV: 85 fL (ref 79–97)
Monocytes Absolute: 0.5 10*3/uL (ref 0.1–0.9)
Monocytes: 6 %
Neutrophils Absolute: 4.6 10*3/uL (ref 1.4–7.0)
Neutrophils: 58 %
Platelets: 214 10*3/uL (ref 150–450)
RBC: 4.99 x10E6/uL (ref 3.77–5.28)
RDW: 13.5 % (ref 11.7–15.4)
WBC: 8 10*3/uL (ref 3.4–10.8)

## 2018-07-19 LAB — BMP8+EGFR
BUN/Creatinine Ratio: 23 (ref 12–28)
BUN: 19 mg/dL (ref 8–27)
CO2: 27 mmol/L (ref 20–29)
Calcium: 10 mg/dL (ref 8.7–10.3)
Chloride: 98 mmol/L (ref 96–106)
Creatinine, Ser: 0.81 mg/dL (ref 0.57–1.00)
GFR calc Af Amer: 77 mL/min/{1.73_m2} (ref >59)
GFR calc non Af Amer: 67 mL/min/{1.73_m2} (ref >59)
Glucose: 97 mg/dL (ref 65–99)
Potassium: 4.1 mmol/L (ref 3.5–5.2)
Sodium: 141 mmol/L (ref 134–144)

## 2018-07-19 LAB — VITAMIN D 25 HYDROXY (VIT D DEFICIENCY, FRACTURES): Vit D, 25-Hydroxy: 60.6 ng/mL (ref 30.0–100.0)

## 2018-07-19 LAB — LIPID PANEL
Chol/HDL Ratio: 3.8 ratio (ref 0.0–4.4)
Cholesterol, Total: 174 mg/dL (ref 100–199)
HDL: 46 mg/dL (ref >39)
LDL Calculated: 102 mg/dL — ABNORMAL HIGH (ref 0–99)
Triglycerides: 130 mg/dL (ref 0–149)
VLDL Cholesterol Cal: 26 mg/dL (ref 5–40)

## 2018-07-21 ENCOUNTER — Telehealth: Payer: Self-pay | Admitting: Family Medicine

## 2018-07-21 NOTE — Telephone Encounter (Signed)
Daughter aware of lab results

## 2018-07-22 DIAGNOSIS — J440 Chronic obstructive pulmonary disease with acute lower respiratory infection: Secondary | ICD-10-CM | POA: Diagnosis not present

## 2018-08-18 ENCOUNTER — Other Ambulatory Visit: Payer: Self-pay

## 2018-08-19 ENCOUNTER — Ambulatory Visit (INDEPENDENT_AMBULATORY_CARE_PROVIDER_SITE_OTHER): Payer: Medicare Other | Admitting: *Deleted

## 2018-08-19 DIAGNOSIS — D508 Other iron deficiency anemias: Secondary | ICD-10-CM

## 2018-08-19 DIAGNOSIS — E538 Deficiency of other specified B group vitamins: Secondary | ICD-10-CM

## 2018-08-19 NOTE — Progress Notes (Signed)
Pt given cyanocobalamin inj Tolerated well 

## 2018-09-01 DIAGNOSIS — J440 Chronic obstructive pulmonary disease with acute lower respiratory infection: Secondary | ICD-10-CM | POA: Diagnosis not present

## 2018-09-19 ENCOUNTER — Other Ambulatory Visit: Payer: Self-pay

## 2018-09-22 ENCOUNTER — Other Ambulatory Visit: Payer: Self-pay

## 2018-09-22 ENCOUNTER — Ambulatory Visit (INDEPENDENT_AMBULATORY_CARE_PROVIDER_SITE_OTHER): Payer: Medicare Other | Admitting: *Deleted

## 2018-09-22 DIAGNOSIS — E538 Deficiency of other specified B group vitamins: Secondary | ICD-10-CM | POA: Diagnosis not present

## 2018-09-22 NOTE — Progress Notes (Signed)
Pt given Cyanocobalamin inj Tolerated well 

## 2018-09-30 ENCOUNTER — Encounter (HOSPITAL_COMMUNITY): Payer: Medicare Other

## 2018-09-30 ENCOUNTER — Ambulatory Visit: Payer: Medicare Other | Admitting: Family

## 2018-10-06 ENCOUNTER — Other Ambulatory Visit: Payer: Self-pay | Admitting: *Deleted

## 2018-10-06 DIAGNOSIS — J440 Chronic obstructive pulmonary disease with acute lower respiratory infection: Secondary | ICD-10-CM | POA: Diagnosis not present

## 2018-10-06 MED ORDER — ALBUTEROL SULFATE (2.5 MG/3ML) 0.083% IN NEBU: INHALATION_SOLUTION | RESPIRATORY_TRACT | 5 refills | Status: DC

## 2018-10-24 ENCOUNTER — Other Ambulatory Visit: Payer: Self-pay

## 2018-10-27 ENCOUNTER — Other Ambulatory Visit: Payer: Self-pay

## 2018-10-27 ENCOUNTER — Ambulatory Visit (INDEPENDENT_AMBULATORY_CARE_PROVIDER_SITE_OTHER): Payer: Medicare Other

## 2018-10-27 DIAGNOSIS — E559 Vitamin D deficiency, unspecified: Secondary | ICD-10-CM | POA: Diagnosis not present

## 2018-10-27 DIAGNOSIS — D508 Other iron deficiency anemias: Secondary | ICD-10-CM | POA: Diagnosis not present

## 2018-10-27 DIAGNOSIS — E538 Deficiency of other specified B group vitamins: Secondary | ICD-10-CM

## 2018-10-27 NOTE — Progress Notes (Signed)
Patient was given b12 injection and tolerated well.

## 2018-11-03 ENCOUNTER — Other Ambulatory Visit: Payer: Self-pay

## 2018-11-04 ENCOUNTER — Ambulatory Visit: Payer: Medicare Other | Admitting: Family Medicine

## 2018-11-04 ENCOUNTER — Encounter: Payer: Self-pay | Admitting: Family Medicine

## 2018-11-04 ENCOUNTER — Ambulatory Visit (INDEPENDENT_AMBULATORY_CARE_PROVIDER_SITE_OTHER): Payer: Medicare Other | Admitting: Family Medicine

## 2018-11-04 VITALS — BP 121/64 | HR 93 | Temp 96.6°F | Ht 67.5 in | Wt 141.4 lb

## 2018-11-04 DIAGNOSIS — E034 Atrophy of thyroid (acquired): Secondary | ICD-10-CM

## 2018-11-04 DIAGNOSIS — E782 Mixed hyperlipidemia: Secondary | ICD-10-CM

## 2018-11-04 DIAGNOSIS — K219 Gastro-esophageal reflux disease without esophagitis: Secondary | ICD-10-CM

## 2018-11-04 DIAGNOSIS — E119 Type 2 diabetes mellitus without complications: Secondary | ICD-10-CM | POA: Diagnosis not present

## 2018-11-04 DIAGNOSIS — J449 Chronic obstructive pulmonary disease, unspecified: Secondary | ICD-10-CM

## 2018-11-04 LAB — BAYER DCA HB A1C WAIVED: HB A1C (BAYER DCA - WAIVED): 6 % (ref <7.0)

## 2018-11-04 NOTE — Progress Notes (Signed)
BP 121/64   Pulse 93   Temp (!) 96.6 F (35.9 C) (Temporal)   Ht 5' 7.5" (1.715 m)   Wt 141 lb 6.4 oz (64.1 kg)   BMI 21.82 kg/m    Subjective:   [name], female    DOB: 06-Oct-1931, 85 y.o.   MRN: [id number]  [name] is a 85 y.o. female presenting on 11/04/2018 for Establish Care (DWM) and COPD (3 month follow up)   HPI Type 2 diabetes mellitus Patient comes in today for recheck of his diabetes. Patient has been currently taking no medication the last A1c was 6.03 months ago, seems like she is diet controlled.. Patient is not currently on an ACE inhibitor/ARB. Patient has not seen an ophthalmologist this year. Patient denies any issues with their feet.   Hypothyroidism recheck Patient is coming in for thyroid recheck today as well. They deny any issues with hair changes or heat or cold problems or diarrhea or constipation. They deny any chest pain or palpitations. They are currently on levothyroxine 100 micrograms   Hyperlipidemia Patient is coming in for recheck of his hyperlipidemia. The patient is currently taking Lovaza and Livalo. They deny any issues with myalgias or history of liver damage from it. They deny any focal numbness or weakness or chest pain.   COPD Patient is coming in for COPD recheck today.  He is currently on Pulmicort and albuterol.  He has a mild chronic cough but denies any major coughing spells or wheezing spells.  He has 2nighttime symptoms per week and 5daytime symptoms per week currently.  She does get bronchitis frequently but she feels like she is doing pretty good right now  Relevant past medical, surgical, family and social history reviewed and updated as indicated. Interim medical history since our last visit reviewed. Allergies and medications reviewed and updated.  Review of Systems  Constitutional: Negative for chills and fever.  Eyes: Negative for visual disturbance.  Respiratory: Positive for cough.  Negative for chest tightness, shortness of breath and wheezing.   Cardiovascular: Negative for chest pain and leg swelling.  Genitourinary: Negative for difficulty urinating and dysuria.  Musculoskeletal: Negative for back pain and gait problem.  Skin: Negative for rash.  Neurological: Negative for light-headedness and headaches.  Psychiatric/Behavioral: Negative for agitation and behavioral problems.  All other systems reviewed and are negative.   Per HPI unless specifically indicated above   Allergies as of 11/04/2018      Reactions   Ace Inhibitors Cough   Codeine Other (See Comments)   Sleepy and crazy      Medication List       Accurate as of November 04, 2018 10:24 AM. If you have any questions, ask your nurse or doctor.        STOP taking these medications   [place] 0.025 % ophthalmic solution Generic drug: ketotifen Stopped by: Worthy Rancher, MD   amoxicillin 875 MG tablet Commonly known as: AMOXIL Stopped by: Fransisca Kaufmann Dettinger, MD   hydroxypropyl methylcellulose / hypromellose 2.5 % ophthalmic solution Commonly known as: ISOPTO TEARS / GONIOVISC Stopped by: Fransisca Kaufmann Dettinger, MD   mupirocin ointment 2 % Commonly known as: Bactroban Stopped by: Worthy Rancher, MD     TAKE these medications   acetaminophen 325 MG tablet Commonly known as: TYLENOL Take 650 mg by mouth every 6 (six) hours as needed (for pain).   albuterol 108 (90 Base) MCG/ACT inhaler Commonly known as: VENTOLIN  HFA Inhale 1 puff into the lungs every 4 (four) hours as needed for wheezing.   albuterol (2.5 MG/3ML) 0.083% nebulizer solution Commonly known as: PROVENTIL USE ONE VIAL IN NEBULIZER EVERY 6 HOURS AS NEEDED   aspirin EC 81 MG tablet Take 81 mg by mouth daily.   budesonide 0.5 MG/2ML nebulizer solution Commonly known as: PULMICORT USE 1 VIAL IN NEBULIZER TWICE DAILY   CALCIUM PO Take 1 tablet by mouth every evening.   cholecalciferol 25 MCG (1000 UT) tablet  Commonly known as: VITAMIN D Take 2,000 Units by mouth every evening.   ferrous sulfate 325 (65 FE) MG tablet Take 1 tablet (325 mg total) by mouth every Monday, Wednesday, and Friday.   [employer] Location manager Use daily for gait instability   hydrochlorothiazide 25 MG tablet Commonly known as: HYDRODIURIL Take 1 tablet (25 mg total) by mouth daily.   levothyroxine 100 MCG tablet Commonly known as: SYNTHROID Take 1 tablet (100 mcg total) by mouth daily before breakfast.   meclizine 25 MG tablet Commonly known as: ANTIVERT Take 25 mg by mouth 2 (two) times daily.   omega-3 acid ethyl esters 1 g capsule Commonly known as: Lovaza Take 1 capsule (1 g total) by mouth daily.   OneTouch Delica Lancets Fine Misc Use  To check blood glucose once a day.  DX:  Type 2 DM E11.9   OneTouch Verio test strip Generic drug: glucose blood USE 1 STRIP TO CHECK GLUCOSE ONCE DAILY   Pitavastatin Calcium 2 MG Tabs Commonly known as: Livalo Take 1 tablet (2 mg total) by mouth every evening.        Objective:   BP 121/64   Pulse 93   Temp (!) 96.6 F (35.9 C) (Temporal)   Ht 5' 7.5" (1.715 m)   Wt 141 lb 6.4 oz (64.1 kg)   BMI 21.82 kg/m   Wt Readings from Last 3 Encounters:  11/04/18 141 lb 6.4 oz (64.1 kg)  04/01/18 136 lb (61.7 kg)  01/22/18 136 lb (61.7 kg)    Physical Exam Vitals signs and nursing note reviewed.  Constitutional:      General: She is not in acute distress.    Appearance: She is well-developed. She is not diaphoretic.  Eyes:     Conjunctiva/sclera: Conjunctivae normal.  Cardiovascular:     Rate and Rhythm: Normal rate and regular rhythm.     Heart sounds: Normal heart sounds. No murmur.  Pulmonary:     Effort: Pulmonary effort is normal. No respiratory distress.     Breath sounds: Wheezing (Patient has wheezes and rhonchi, seems like her baseline) and rhonchi present. No rales.  Chest:     Chest wall: No tenderness.  Musculoskeletal: Normal  range of motion.        General: No tenderness.  Skin:    General: Skin is warm and dry.     Findings: No rash.  Neurological:     Mental Status: She is alert and oriented to person, place, and time.     Coordination: Coordination normal.  Psychiatric:        Behavior: Behavior normal.       Assessment & Plan:   Problem List Items Addressed This Visit      Respiratory   COPD (chronic obstructive pulmonary disease) with chronic bronchitis (HCC)     Digestive   GERD (gastroesophageal reflux disease)     Endocrine   Hypothyroidism - Primary   Relevant Orders   TSH  Type 2 diabetes mellitus treated without insulin ([place])   Relevant Orders   CBC with Differential/Platelet   CMP14+EGFR   Bayer DCA Hb A1c Waived     Other   Hyperlipidemia   Relevant Orders   Lipid panel    No change in medication, patient gets most of her medication from the [place] [facility]    Follow up plan: Return in about 3 months (around 02/04/2019), or if symptoms worsen or fail to improve, for Diabetes and thyroid recheck.  Counseling provided for all of the vaccine components No orders of the defined types were placed in this encounter.   Caryl Pina, MD [facility] 11/04/2018, 10:24 AM

## 2018-11-05 LAB — CMP14+EGFR
ALT: 8 IU/L (ref 0–32)
AST: 13 IU/L (ref 0–40)
Albumin/Globulin Ratio: 1.8 (ref 1.2–2.2)
Albumin: 4.4 g/dL (ref 3.6–4.6)
Alkaline Phosphatase: 45 IU/L (ref 39–117)
BUN/Creatinine Ratio: 26 (ref 12–28)
BUN: 22 mg/dL (ref 8–27)
Bilirubin Total: 0.3 mg/dL (ref 0.0–1.2)
CO2: 30 mmol/L — ABNORMAL HIGH (ref 20–29)
Calcium: 10 mg/dL (ref 8.7–10.3)
Chloride: 96 mmol/L (ref 96–106)
Creatinine, Ser: 0.84 mg/dL (ref 0.57–1.00)
GFR calc Af Amer: 73 mL/min/{1.73_m2} (ref >59)
GFR calc non Af Amer: 64 mL/min/{1.73_m2} (ref >59)
Globulin, Total: 2.4 g/dL (ref 1.5–4.5)
Glucose: 95 mg/dL (ref 65–99)
Potassium: 3.8 mmol/L (ref 3.5–5.2)
Sodium: 142 mmol/L (ref 134–144)
Total Protein: 6.8 g/dL (ref 6.0–8.5)

## 2018-11-05 LAB — LIPID PANEL
Chol/HDL Ratio: 3.4 ratio (ref 0.0–4.4)
Cholesterol, Total: 163 mg/dL (ref 100–199)
HDL: 48 mg/dL (ref >39)
LDL Calculated: 84 mg/dL (ref 0–99)
Triglycerides: 153 mg/dL — ABNORMAL HIGH (ref 0–149)
VLDL Cholesterol Cal: 31 mg/dL (ref 5–40)

## 2018-11-05 LAB — CBC WITH DIFFERENTIAL/PLATELET
Basophils Absolute: 0.1 10*3/uL (ref 0.0–0.2)
Basos: 1 %
EOS (ABSOLUTE): 0.3 10*3/uL (ref 0.0–0.4)
Eos: 4 %
Hematocrit: 40.4 % (ref 34.0–46.6)
Hemoglobin: 13.2 g/dL (ref 11.1–15.9)
Immature Grans (Abs): 0 10*3/uL (ref 0.0–0.1)
Immature Granulocytes: 0 %
Lymphocytes Absolute: 2.6 10*3/uL (ref 0.7–3.1)
Lymphs: 30 %
MCH: 28.2 pg (ref 26.6–33.0)
MCHC: 32.7 g/dL (ref 31.5–35.7)
MCV: 86 fL (ref 79–97)
Monocytes Absolute: 0.6 10*3/uL (ref 0.1–0.9)
Monocytes: 6 %
Neutrophils Absolute: 5.4 10*3/uL (ref 1.4–7.0)
Neutrophils: 59 %
Platelets: 231 10*3/uL (ref 150–450)
RBC: 4.68 x10E6/uL (ref 3.77–5.28)
RDW: 13.2 % (ref 11.7–15.4)
WBC: 9 10*3/uL (ref 3.4–10.8)

## 2018-11-05 LAB — TSH: TSH: 0.816 u[IU]/mL (ref 0.450–4.500)

## 2018-11-26 ENCOUNTER — Ambulatory Visit (INDEPENDENT_AMBULATORY_CARE_PROVIDER_SITE_OTHER): Payer: Medicare Other | Admitting: Family Medicine

## 2018-11-26 DIAGNOSIS — J441 Chronic obstructive pulmonary disease with (acute) exacerbation: Secondary | ICD-10-CM | POA: Diagnosis not present

## 2018-11-26 MED ORDER — AMOXICILLIN-POT CLAVULANATE 875-125 MG PO TABS: 1.0000 | ORAL_TABLET | Freq: Two times a day (BID) | ORAL | 0 refills | Status: DC

## 2018-11-26 MED ORDER — PREDNISONE 20 MG PO TABS: 40.0000 mg | ORAL_TABLET | Freq: Every day | ORAL | 0 refills | Status: AC

## 2018-11-26 NOTE — Progress Notes (Signed)
Telephone visit  Subjective: CC: bronchitis PCP: Dettinger, Fransisca Kaufmann, MD Katie Wilkins is a 85 y.o. female calls for telephone consult today. Patient provides verbal consent for consult held via phone.  Location of patient: home Location of provider: Working remotely from home Others present for call: none  1. Bronchitis She reports that she has been having chest tightness and myalgia earlier this week that is getting worse.  She reports productive cough without hemoptysis.  No fever, nausea, vomiting, diarrhea.  She reports intermittent wheezing and shortness of breath.  She has known COPD and uses breathing machine w/ pulmicort and albuterol BID. She reports that cough gets better after treatments but chest tightness remains.  No known sick contacts.   ROS: Per HPI  Allergies  Allergen Reactions  . Ace Inhibitors Cough  . Codeine Other (See Comments)    Sleepy and crazy   Past Medical History:  Diagnosis Date  . Abdominal bloating   . anemia iron deficiency   . Arthritis   . Bowen's disease   . Cardiomyopathy   . Cataract   . CHF (congestive heart failure) ([place])   . COPD (chronic obstructive pulmonary disease) ([place])   . Diabetes mellitus without complication ([place])   . Diarrhea   . Diverticulosis   . Heme + stool   . Hemorrhoids   . Hiatal hernia   . Hyperlipemia   . Mitral valve regurgitation   . Neoplasm    Malignant, anal canal, squamous cell  . Nodular goiter   . Osteoporosis   . Rectal bleeding   . Rectocele   . Renal cyst   . Vitamin D deficiency     Current Outpatient Medications:  .  acetaminophen (TYLENOL) 325 MG tablet, Take 650 mg by mouth every 6 (six) hours as needed (for pain). , Disp: , Rfl:  .  albuterol (PROVENTIL HFA;VENTOLIN HFA) 108 (90 Base) MCG/ACT inhaler, Inhale 1 puff into the lungs every 4 (four) hours as needed for wheezing., Disp: 18 g, Rfl: 11 .  albuterol (PROVENTIL) (2.5 MG/3ML) 0.083% nebulizer solution, USE ONE VIAL IN  NEBULIZER EVERY 6 HOURS AS NEEDED, Disp: 225 mL, Rfl: 5 .  aspirin EC 81 MG tablet, Take 81 mg by mouth daily., Disp: , Rfl:  .  budesonide (PULMICORT) 0.5 MG/2ML nebulizer solution, USE 1 VIAL IN NEBULIZER TWICE DAILY, Disp: 2 mL, Rfl: 3 .  CALCIUM PO, Take 1 tablet by mouth every evening., Disp: , Rfl:  .  Cholecalciferol (VITAMIN D3) 1000 UNITS tablet, Take 2,000 Units by mouth every evening. , Disp: , Rfl:  .  ferrous sulfate 325 (65 FE) MG tablet, Take 1 tablet (325 mg total) by mouth every Monday, Wednesday, and Friday., Disp: 15 tablet, Rfl: 11 .  hydrochlorothiazide (HYDRODIURIL) 25 MG tablet, Take 1 tablet (25 mg total) by mouth daily., Disp: 90 tablet, Rfl: 3 .  levothyroxine (SYNTHROID, LEVOTHROID) 100 MCG tablet, Take 1 tablet (100 mcg total) by mouth daily before breakfast., Disp: 90 tablet, Rfl: 3 .  meclizine (ANTIVERT) 25 MG tablet, Take 25 mg by mouth 2 (two) times daily., Disp: , Rfl:  .  Misc. Devices ([place]) MISC, Use daily for gait instability, Disp: 1 each, Rfl: 0 .  omega-3 acid ethyl esters (LOVAZA) 1 g capsule, Take 1 capsule (1 g total) by mouth daily., Disp: 90 capsule, Rfl: 3 .  ONETOUCH DELICA LANCETS FINE MISC, Use  To check blood glucose once a day.  DX:  Type 2  DM E11.9, Disp: 100 each, Rfl: 1 .  ONETOUCH VERIO test strip, USE 1 STRIP TO CHECK GLUCOSE ONCE DAILY, Disp: 100 each, Rfl: 10 .  Pitavastatin Calcium (LIVALO) 2 MG TABS, Take 1 tablet (2 mg total) by mouth every evening., Disp: 90 tablet, Rfl: 3  Current Facility-Administered Medications:  .  cyanocobalamin ((VITAMIN B-12)) injection 1,000 mcg, 1,000 mcg, Intramuscular, Q30 days, Chipper Herb, MD, 1,000 mcg at 10/27/18 0626  Gen: not in distress Pulm: no dyspnea with speech. No audible wheezes on phone  Assessment/ Plan: 85 y.o. female   1. COPD with acute exacerbation (HCC) Acute exacerbation of her COPD.  Will treat with steroid burst and oral antibiotic.  I offered COVID-19  testing the patient declined today.  She feels this is consistent with her typical COPD exacerbations.  I advised her that if no significant improvement within the next 2 days I would proceed with testing.  We discussed CDC recommendations for isolation.  She voiced good understanding will proceed.  We discussed red flag signs and symptoms warranting further evaluation emergency department.  She was good understanding. - predniSONE (DELTASONE) 20 MG tablet; Take 2 tablets (40 mg total) by mouth daily with breakfast for 5 days.  Dispense: 10 tablet; Refill: 0 - amoxicillin-clavulanate (AUGMENTIN) 875-125 MG tablet; Take 1 tablet by mouth 2 (two) times daily.  Dispense: 20 tablet; Refill: 0   Start time: 10:33am End time: 10:40am  Total time spent on patient care (including telephone call/ virtual visit): 12 minutes  [place], [place] [phone]

## 2018-11-26 NOTE — Patient Instructions (Signed)
Prevent the Spread of COVID-19 if You Are Sick If you are sick with COVID-19 or think you might have COVID-19, follow the steps below to help protect other people in your home and community. Stay home except to get medical care.  Stay home. Most people with COVID-19 have mild illness and are able to recover at home without medical care. Do not leave your home, except to get medical care. Do not visit public areas.  Take care of yourself. Get rest and stay hydrated.  Get medical care when needed. Call your doctor before you go to their office for care. But, if you have trouble breathing or other concerning symptoms, call 911 for immediate help.  Avoid public transportation, ride-sharing, or taxis. Separate yourself from other people and pets in your home.  As much as possible, stay in a specific room and away from other people and pets in your home. Also, you should use a separate bathroom, if available. If you need to be around other people or animals in or outside of the home, wear a cloth face covering. ? See COVID-19 and Animals if you have questions about pets: https://www.cdc.gov/coronavirus/2019-ncov/faq.html#COVID19animals Monitor your symptoms.  Common symptoms of COVID-19 include fever and cough. Trouble breathing is a more serious symptom that means you should get medical attention.  Follow care instructions from your healthcare provider and local health department. Your local health authorities will give instructions on checking your symptoms and reporting information. If you develop emergency warning signs for COVID-19 get medical attention immediately.  Emergency warning signs include*:  Trouble breathing  Persistent pain or pressure in the chest  New confusion or not able to be woken  Bluish lips or face *This list is not all inclusive. Please consult your medical provider for any other symptoms that are severe or concerning to you. Call 911 if you have a medical  emergency. If you have a medical emergency and need to call 911, notify the operator that you have or think you might have, COVID-19. If possible, put on a facemask before medical help arrives. Call ahead before visiting your doctor.  Call ahead. Many medical visits for routine care are being postponed or done by phone or telemedicine.  If you have a medical appointment that cannot be postponed, call your doctor's office. This will help the office protect themselves and other patients. If you are sick, wear a cloth covering over your nose and mouth.  You should wear a cloth face covering over your nose and mouth if you must be around other people or animals, including pets (even at home).  You don't need to wear the cloth face covering if you are alone. If you can't put on a cloth face covering (because of trouble breathing for example), cover your coughs and sneezes in some other way. Try to stay at least 6 feet away from other people. This will help protect the people around you. Note: During the COVID-19 pandemic, medical grade facemasks are reserved for healthcare workers and some first responders. You may need to make a cloth face covering using a scarf or bandana. Cover your coughs and sneezes.  Cover your mouth and nose with a tissue when you cough or sneeze.  Throw used tissues in a lined trash can.  Immediately wash your hands with soap and water for at least 20 seconds. If soap and water are not available, clean your hands with an alcohol-based hand sanitizer that contains at least 60% alcohol. Clean your hands often.    Wash your hands often with soap and water for at least 20 seconds. This is especially important after blowing your nose, coughing, or sneezing; going to the bathroom; and before eating or preparing food.  Use hand sanitizer if soap and water are not available. Use an alcohol-based hand sanitizer with at least 60% alcohol, covering all surfaces of your hands and rubbing  them together until they feel dry.  Soap and water are the best option, especially if your hands are visibly dirty.  Avoid touching your eyes, nose, and mouth with unwashed hands. Avoid sharing personal household items.  Do not share dishes, drinking glasses, cups, eating utensils, towels, or bedding with other people in your home.  Wash these items thoroughly after using them with soap and water or put them in the dishwasher. Clean all "high-touch" surfaces everyday.  Clean and disinfect high-touch surfaces in your "sick room" and bathroom. Let someone else clean and disinfect surfaces in common areas, but not your bedroom and bathroom.  If a caregiver or other person needs to clean and disinfect a sick person's bedroom or bathroom, they should do so on an as-needed basis. The caregiver/other person should wear a mask and wait as long as possible after the sick person has used the bathroom. High-touch surfaces include phones, remote controls, counters, tabletops, doorknobs, bathroom fixtures, toilets, keyboards, tablets, and bedside tables.  Clean and disinfect areas that may have blood, stool, or body fluids on them.  Use household cleaners and disinfectants. Clean the area or item with soap and water or another detergent if it is dirty. Then use a household disinfectant. ? Be sure to follow the instructions on the label to ensure safe and effective use of the product. Many products recommend keeping the surface wet for several minutes to ensure germs are killed. Many also recommend precautions such as wearing gloves and making sure you have good ventilation during use of the product. ? Most EPA-registered household disinfectants should be effective. How to discontinue home isolation  People with COVID-19 who have stayed home (home isolated) can stop home isolation under the following conditions: ? If you will not have a test to determine if you are still contagious, you can leave home  after these three things have happened:  You have had no fever for at least 72 hours (that is three full days of no fever without the use of medicine that reduces fevers) AND  other symptoms have improved (for example, when your cough or shortness of breath has improved) AND  at least 10 days have passed since your symptoms first appeared. ? If you will be tested to determine if you are still contagious, you can leave home after these three things have happened:  You no longer have a fever (without the use of medicine that reduces fevers) AND  other symptoms have improved (for example, when your cough or shortness of breath has improved) AND  you received two negative tests in a row, 24 hours apart. Your doctor will follow CDC guidelines. In all cases, follow the guidance of your healthcare provider and local health department. The decision to stop home isolation should be made in consultation with your healthcare provider and state and local health departments. Local decisions depend on local circumstances. cdc.gov/coronavirus 07/20/2018 This information is not intended to replace advice given to you by your health care provider. Make sure you discuss any questions you have with your health care provider. Document Released: 07/01/2018 Document Revised: 07/30/2018 Document Reviewed: 07/01/2018   Elsevier Patient Education  2020 [employer]  

## 2018-11-28 ENCOUNTER — Other Ambulatory Visit: Payer: Self-pay

## 2018-12-01 ENCOUNTER — Other Ambulatory Visit: Payer: Self-pay

## 2018-12-01 ENCOUNTER — Ambulatory Visit (INDEPENDENT_AMBULATORY_CARE_PROVIDER_SITE_OTHER): Payer: Medicare Other | Admitting: *Deleted

## 2018-12-01 DIAGNOSIS — E559 Vitamin D deficiency, unspecified: Secondary | ICD-10-CM | POA: Diagnosis not present

## 2018-12-01 DIAGNOSIS — E538 Deficiency of other specified B group vitamins: Secondary | ICD-10-CM

## 2018-12-01 NOTE — Progress Notes (Signed)
Pt given Cyanocobalamin inj Tolerated well 

## 2018-12-16 DIAGNOSIS — J440 Chronic obstructive pulmonary disease with acute lower respiratory infection: Secondary | ICD-10-CM | POA: Diagnosis not present

## 2019-01-01 ENCOUNTER — Other Ambulatory Visit: Payer: Self-pay

## 2019-01-01 ENCOUNTER — Ambulatory Visit (INDEPENDENT_AMBULATORY_CARE_PROVIDER_SITE_OTHER): Payer: Medicare Other

## 2019-01-01 DIAGNOSIS — Z23 Encounter for immunization: Secondary | ICD-10-CM | POA: Diagnosis not present

## 2019-01-01 DIAGNOSIS — D508 Other iron deficiency anemias: Secondary | ICD-10-CM

## 2019-01-01 DIAGNOSIS — E538 Deficiency of other specified B group vitamins: Secondary | ICD-10-CM

## 2019-01-01 NOTE — Progress Notes (Signed)
Cyanocobalamin injection given to right deltoid.  Patient tolerated well. 

## 2019-01-27 DIAGNOSIS — E119 Type 2 diabetes mellitus without complications: Secondary | ICD-10-CM | POA: Diagnosis not present

## 2019-01-29 ENCOUNTER — Ambulatory Visit: Payer: Medicare Other

## 2019-01-30 ENCOUNTER — Telehealth: Payer: Self-pay | Admitting: Family Medicine

## 2019-02-02 ENCOUNTER — Other Ambulatory Visit: Payer: Self-pay

## 2019-02-02 ENCOUNTER — Ambulatory Visit (INDEPENDENT_AMBULATORY_CARE_PROVIDER_SITE_OTHER): Payer: Medicare Other

## 2019-02-02 DIAGNOSIS — E538 Deficiency of other specified B group vitamins: Secondary | ICD-10-CM | POA: Diagnosis not present

## 2019-02-02 NOTE — Progress Notes (Signed)
Cyanocobalamin injection given to right deltoid.  Patient tolerated well. 

## 2019-02-05 ENCOUNTER — Ambulatory Visit: Payer: Medicare Other | Admitting: Family Medicine

## 2019-02-14 ENCOUNTER — Other Ambulatory Visit: Payer: Self-pay | Admitting: Family Medicine

## 2019-02-14 DIAGNOSIS — J209 Acute bronchitis, unspecified: Secondary | ICD-10-CM

## 2019-02-14 DIAGNOSIS — J440 Chronic obstructive pulmonary disease with acute lower respiratory infection: Secondary | ICD-10-CM

## 2019-02-16 DIAGNOSIS — J440 Chronic obstructive pulmonary disease with acute lower respiratory infection: Secondary | ICD-10-CM | POA: Diagnosis not present

## 2019-02-24 ENCOUNTER — Other Ambulatory Visit: Payer: Self-pay

## 2019-02-25 ENCOUNTER — Ambulatory Visit (INDEPENDENT_AMBULATORY_CARE_PROVIDER_SITE_OTHER): Payer: Medicare Other | Admitting: Family Medicine

## 2019-02-25 ENCOUNTER — Encounter: Payer: Self-pay | Admitting: Family Medicine

## 2019-02-25 ENCOUNTER — Other Ambulatory Visit: Payer: Self-pay

## 2019-02-25 VITALS — BP 137/59 | HR 93 | Temp 96.2°F | Ht 67.5 in | Wt 136.4 lb

## 2019-02-25 DIAGNOSIS — E782 Mixed hyperlipidemia: Secondary | ICD-10-CM

## 2019-02-25 DIAGNOSIS — E034 Atrophy of thyroid (acquired): Secondary | ICD-10-CM | POA: Diagnosis not present

## 2019-02-25 DIAGNOSIS — E119 Type 2 diabetes mellitus without complications: Secondary | ICD-10-CM | POA: Diagnosis not present

## 2019-02-25 LAB — BAYER DCA HB A1C WAIVED: HB A1C (BAYER DCA - WAIVED): 6.1 % (ref <7.0)

## 2019-02-25 MED ORDER — LEVOTHYROXINE SODIUM 100 MCG PO TABS: 100.0000 ug | ORAL_TABLET | Freq: Every day | ORAL | 3 refills | Status: DC

## 2019-02-25 MED ORDER — FERROUS SULFATE 325 (65 FE) MG PO TABS: 325.0000 mg | ORAL_TABLET | ORAL | 11 refills | Status: AC

## 2019-02-25 NOTE — Progress Notes (Signed)
BP (!) 137/59   Pulse 93   Temp (!) 96.2 F (35.7 C) (Temporal)   Ht 5' 7.5" (1.715 m)   Wt 136 lb 6.4 oz (61.9 kg)   SpO2 92%   BMI 21.05 kg/m    Subjective:   [name], female    DOB: 1931/12/19, 85 y.o.   MRN: [id number]  [name] is a 85 y.o. female presenting on 02/25/2019 for Diabetes (3 month follow up) and Hypothyroidism   HPI Type 2 diabetes mellitus Patient comes in today for recheck of his diabetes. Patient has been currently taking no medication and A1c is 6.1 today, has been diet controlled. Patient is not currently on an ACE inhibitor/ARB. Patient has not seen an ophthalmologist this year. Patient denies any issues with their feet.  She does have a decreased circulation left foot and she picks at a spot on the outside of her left toe on the small toe but it appears to be healing, no signs of infection, patient also takes Livalo  Hypertension Patient is currently on hydrochlorothiazide, and their blood pressure today is 137/59. Patient denies any lightheadedness or dizziness. Patient denies headaches, blurred vision, chest pains, shortness of breath, or weakness. Denies any side effects from medication and is content with current medication.   Hypothyroidism recheck Patient is coming in for thyroid recheck today as well. They deny any issues with hair changes or heat or cold problems or diarrhea or constipation. They deny any chest pain or palpitations. They are currently on levothyroxine 144mcrograms   Relevant past medical, surgical, family and social history reviewed and updated as indicated. Interim medical history since our last visit reviewed. Allergies and medications reviewed and updated.  Review of Systems  Constitutional: Negative for chills and fever.  HENT: Negative for congestion, ear discharge and ear pain.   Eyes: Negative for redness and visual disturbance.  Respiratory: Negative for chest tightness and shortness of  breath.   Cardiovascular: Negative for chest pain and leg swelling.  Genitourinary: Negative for difficulty urinating and dysuria.  Musculoskeletal: Negative for back pain and gait problem.  Skin: Negative for rash.  Neurological: Negative for light-headedness and headaches.  Psychiatric/Behavioral: Negative for agitation and behavioral problems.  All other systems reviewed and are negative.   Per HPI unless specifically indicated above   Allergies as of 02/25/2019      Reactions   Ace Inhibitors Cough   Codeine Other (See Comments)   Sleepy and crazy      Medication List       Accurate as of February 25, 2019  1:46 PM. If you have any questions, ask your nurse or doctor.        STOP taking these medications   amoxicillin-clavulanate 875-125 MG tablet Commonly known as: AUGMENTIN Stopped by: JFransisca KaufmannDettinger, MD     TAKE these medications   acetaminophen 325 MG tablet Commonly known as: TYLENOL Take 650 mg by mouth every 6 (six) hours as needed (for pain).   albuterol 108 (90 Base) MCG/ACT inhaler Commonly known as: VENTOLIN HFA Inhale 1 puff into the lungs every 4 (four) hours as needed for wheezing.   albuterol (2.5 MG/3ML) 0.083% nebulizer solution Commonly known as: PROVENTIL USE ONE VIAL IN NEBULIZER EVERY 6 HOURS AS NEEDED   aspirin EC 81 MG tablet Take 81 mg by mouth daily.   budesonide 0.5 MG/2ML nebulizer solution Commonly known as: PULMICORT USE 1 VIAL IN NEBULIZER TWICE DAILY  CALCIUM PO Take 1 tablet by mouth every evening.   cholecalciferol 25 MCG (1000 UT) tablet Commonly known as: VITAMIN D Take 2,000 Units by mouth every evening.   ferrous sulfate 325 (65 FE) MG tablet Take 1 tablet (325 mg total) by mouth every Monday, Wednesday, and Friday.   Hugo Location manager Use daily for gait instability   hydrochlorothiazide 25 MG tablet Commonly known as: HYDRODIURIL Take 1 tablet (25 mg total) by mouth daily.   levothyroxine  100 MCG tablet Commonly known as: SYNTHROID Take 1 tablet (100 mcg total) by mouth daily before breakfast.   meclizine 25 MG tablet Commonly known as: ANTIVERT Take 25 mg by mouth 2 (two) times daily.   omega-3 acid ethyl esters 1 g capsule Commonly known as: Lovaza Take 1 capsule (1 g total) by mouth daily.   OneTouch Delica Lancets Fine Misc Use  To check blood glucose once a day.  DX:  Type 2 DM E11.9   OneTouch Verio test strip Generic drug: glucose blood USE 1 STRIP TO CHECK GLUCOSE ONCE DAILY   Pitavastatin Calcium 2 MG Tabs Commonly known as: Livalo Take 1 tablet (2 mg total) by mouth every evening.        Objective:   BP (!) 137/59   Pulse 93   Temp (!) 96.2 F (35.7 C) (Temporal)   Ht 5' 7.5" (1.715 m)   Wt 136 lb 6.4 oz (61.9 kg)   SpO2 92%   BMI 21.05 kg/m   Wt Readings from Last 3 Encounters:  02/25/19 136 lb 6.4 oz (61.9 kg)  11/04/18 141 lb 6.4 oz (64.1 kg)  04/01/18 136 lb (61.7 kg)    Physical Exam Vitals signs and nursing note reviewed.  Constitutional:      General: She is not in acute distress.    Appearance: She is well-developed. She is not diaphoretic.  Eyes:     Conjunctiva/sclera: Conjunctivae normal.  Cardiovascular:     Rate and Rhythm: Normal rate and regular rhythm.     Heart sounds: Normal heart sounds. No murmur.  Pulmonary:     Effort: Pulmonary effort is normal. No respiratory distress.     Breath sounds: Wheezing (Patient has wheezing but she says is her baseline and feels like she is doing very well) present. No rhonchi.  Musculoskeletal: Normal range of motion.        General: No tenderness.  Skin:    General: Skin is warm and dry.     Findings: No rash.  Neurological:     Mental Status: She is alert and oriented to person, place, and time.     Coordination: Coordination normal.  Psychiatric:        Behavior: Behavior normal.       Assessment & Plan:   Problem List Items Addressed This Visit      Endocrine    Hypothyroidism   Relevant Medications   levothyroxine (SYNTHROID) 100 MCG tablet   Other Relevant Orders   TSH   Type 2 diabetes mellitus treated without insulin ([place]) - Primary   Relevant Orders   Microalbumin / creatinine urine ratio   hgba1c   BMP8+EGFR     Other   Hyperlipidemia      Patient sounds like she is doing well her A1c looks good, no change in medication, will continue to monitor and she will continue to be diet controlled. Follow up plan: Return in about 3 months (around 05/26/2019), or if symptoms worsen or  fail to improve, for Diabetes and thyroid recheck.  Counseling provided for all of the vaccine components Orders Placed This Encounter  Procedures  . Microalbumin / creatinine urine ratio  . hgba1c  . TSH  . BMP8+EGFR    Caryl Pina, MD [facility] 02/25/2019, 1:46 PM

## 2019-02-26 LAB — BMP8+EGFR
BUN/Creatinine Ratio: 26 (ref 12–28)
BUN: 19 mg/dL (ref 8–27)
CO2: 28 mmol/L (ref 20–29)
Calcium: 9.7 mg/dL (ref 8.7–10.3)
Chloride: 99 mmol/L (ref 96–106)
Creatinine, Ser: 0.73 mg/dL (ref 0.57–1.00)
GFR calc Af Amer: 87 mL/min/{1.73_m2} (ref >59)
GFR calc non Af Amer: 75 mL/min/{1.73_m2} (ref >59)
Glucose: 123 mg/dL — ABNORMAL HIGH (ref 65–99)
Potassium: 3.2 mmol/L — ABNORMAL LOW (ref 3.5–5.2)
Sodium: 143 mmol/L (ref 134–144)

## 2019-02-26 LAB — TSH: TSH: 0.191 u[IU]/mL — ABNORMAL LOW (ref 0.450–4.500)

## 2019-03-04 ENCOUNTER — Telehealth: Payer: Self-pay | Admitting: Family Medicine

## 2019-03-04 ENCOUNTER — Other Ambulatory Visit: Payer: Self-pay | Admitting: Family Medicine

## 2019-03-04 MED ORDER — POTASSIUM CHLORIDE CRYS ER 20 MEQ PO TBCR: 20.0000 meq | EXTENDED_RELEASE_TABLET | Freq: Every day | ORAL | 3 refills | Status: DC

## 2019-03-04 NOTE — Telephone Encounter (Signed)
Refer to labs  °

## 2019-03-05 ENCOUNTER — Ambulatory Visit: Payer: Medicare Other

## 2019-03-06 ENCOUNTER — Telehealth: Payer: Self-pay | Admitting: Family Medicine

## 2019-03-06 NOTE — Telephone Encounter (Signed)
Patient aware K+ was sent to [place]. Waiting for Dettinger  to sign harcopy of levothyroxin 46mcg

## 2019-03-06 NOTE — Telephone Encounter (Signed)
Copy signed and faxed

## 2019-03-10 ENCOUNTER — Other Ambulatory Visit: Payer: Self-pay

## 2019-03-11 ENCOUNTER — Other Ambulatory Visit: Payer: Self-pay

## 2019-03-11 ENCOUNTER — Ambulatory Visit (INDEPENDENT_AMBULATORY_CARE_PROVIDER_SITE_OTHER): Payer: Medicare Other

## 2019-03-11 DIAGNOSIS — E538 Deficiency of other specified B group vitamins: Secondary | ICD-10-CM

## 2019-03-11 NOTE — Progress Notes (Signed)
Cyanocobalamin injection given.  Patient tolerated well.

## 2019-03-31 ENCOUNTER — Other Ambulatory Visit: Payer: Self-pay | Admitting: Family Medicine

## 2019-03-31 DIAGNOSIS — J209 Acute bronchitis, unspecified: Secondary | ICD-10-CM

## 2019-03-31 DIAGNOSIS — J440 Chronic obstructive pulmonary disease with acute lower respiratory infection: Secondary | ICD-10-CM | POA: Diagnosis not present

## 2019-04-13 ENCOUNTER — Ambulatory Visit (INDEPENDENT_AMBULATORY_CARE_PROVIDER_SITE_OTHER): Payer: Medicare Other

## 2019-04-13 ENCOUNTER — Other Ambulatory Visit: Payer: Self-pay

## 2019-04-13 DIAGNOSIS — E538 Deficiency of other specified B group vitamins: Secondary | ICD-10-CM

## 2019-05-11 ENCOUNTER — Other Ambulatory Visit: Payer: Self-pay | Admitting: Family Medicine

## 2019-05-11 DIAGNOSIS — J440 Chronic obstructive pulmonary disease with acute lower respiratory infection: Secondary | ICD-10-CM | POA: Diagnosis not present

## 2019-05-11 DIAGNOSIS — J209 Acute bronchitis, unspecified: Secondary | ICD-10-CM

## 2019-05-15 ENCOUNTER — Ambulatory Visit (INDEPENDENT_AMBULATORY_CARE_PROVIDER_SITE_OTHER): Payer: Medicare Other

## 2019-05-15 ENCOUNTER — Other Ambulatory Visit: Payer: Self-pay

## 2019-05-15 DIAGNOSIS — E538 Deficiency of other specified B group vitamins: Secondary | ICD-10-CM | POA: Diagnosis not present

## 2019-05-15 NOTE — Progress Notes (Signed)
Cyanocobalamin injection given to left deltoid.  Patient tolerated well. 

## 2019-05-27 ENCOUNTER — Other Ambulatory Visit: Payer: Self-pay

## 2019-05-28 ENCOUNTER — Ambulatory Visit: Payer: Medicare Other | Admitting: Family Medicine

## 2019-05-28 ENCOUNTER — Encounter: Payer: Self-pay | Admitting: Family Medicine

## 2019-05-28 VITALS — BP 114/68 | HR 88 | Temp 98.9°F | Ht 67.0 in | Wt 133.0 lb

## 2019-05-28 DIAGNOSIS — E782 Mixed hyperlipidemia: Secondary | ICD-10-CM | POA: Diagnosis not present

## 2019-05-28 DIAGNOSIS — E034 Atrophy of thyroid (acquired): Secondary | ICD-10-CM

## 2019-05-28 DIAGNOSIS — E119 Type 2 diabetes mellitus without complications: Secondary | ICD-10-CM

## 2019-05-28 DIAGNOSIS — L570 Actinic keratosis: Secondary | ICD-10-CM

## 2019-05-28 DIAGNOSIS — K219 Gastro-esophageal reflux disease without esophagitis: Secondary | ICD-10-CM | POA: Diagnosis not present

## 2019-05-28 LAB — BAYER DCA HB A1C WAIVED: HB A1C (BAYER DCA - WAIVED): 6.2 % (ref <7.0)

## 2019-05-28 NOTE — Progress Notes (Signed)
BP 114/68   Pulse 88   Temp 98.9 F (37.2 C)   Ht _0  (1.702 m)   Wt 133 lb (60.3 kg)   SpO2 92%   BMI 20.83 kg/m    Subjective:   [name], female    DOB: 02-12-1932, 86 y.o.   MRN: [id number]  [name] is a 86 y.o. female presenting on 05/28/2019 for Medical Management of Chronic Issues, Diabetes,   HPI Patient self skin lesion that is precancerous on her left forearm but has reduced in size the last time we did cryotherapy, we will do it again.  Hypothyroidism recheck Patient is coming in for thyroid recheck today as well. They deny any issues with hair changes or heat or cold problems or diarrhea or constipation. They deny any chest pain or palpitations. They are currently on levothyroxine 75 micrograms  Type 2 diabetes mellitus Patient comes in today for recheck of his diabetes. Patient has been currently taking no medication currently, monitoring and diet control.  A1c is 6.2 today. Patient is not currently on an ACE inhibitor/ARB. Patient has not seen an ophthalmologist this year. Patient denies any issues with their feet.   Hyperlipidemia Patient is coming in for recheck of his hyperlipidemia. The patient is currently taking omega-3 and Livalo. They deny any issues with myalgias or history of liver damage from it. They deny any focal numbness or weakness or chest pain.   GERD Patient is currently on no medication currently.  She denies any major symptoms or abdominal pain or belching or burping. She denies any blood in her stool or lightheadedness or dizziness.   Relevant past medical, surgical, family and social history reviewed and updated as indicated. Interim medical history since our last visit reviewed. Allergies and medications reviewed and updated.  Review of Systems  Constitutional: Negative for chills and fever.  Eyes: Negative for visual disturbance.  Respiratory: Negative for chest tightness and shortness of breath.     Cardiovascular: Negative for chest pain and leg swelling.  Musculoskeletal: Negative for back pain and gait problem.  Skin: Negative for color change and rash.  Neurological: Negative for light-headedness and headaches.  Psychiatric/Behavioral: Negative for agitation and behavioral problems.  All other systems reviewed and are negative.   Per HPI unless specifically indicated above   Allergies as of 05/28/2019      Reactions   Ace Inhibitors Cough   Codeine Other (See Comments)   Sleepy and crazy      Medication List       Accurate as of May 28, 2019 11:59 PM. If you have any questions, ask your nurse or doctor.        acetaminophen 325 MG tablet Commonly known as: TYLENOL Take 650 mg by mouth every 6 (six) hours as needed (for pain).   albuterol 108 (90 Base) MCG/ACT inhaler Commonly known as: VENTOLIN HFA Inhale 1 puff into the lungs every 4 (four) hours as needed for wheezing.   albuterol (2.5 MG/3ML) 0.083% nebulizer solution Commonly known as: PROVENTIL USE ONE VIAL IN NEBULIZER EVERY 6 HOURS AS NEEDED   aspirin EC 81 MG tablet Take 81 mg by mouth daily.   budesonide 0.5 MG/2ML nebulizer solution Commonly known as: PULMICORT USE 1 VIAL IN NEBULIZER TWICE DAILY   CALCIUM PO Take 1 tablet by mouth every evening.   cholecalciferol 25 MCG (1000 UNIT) tablet Commonly known as: VITAMIN D Take 2,000 Units by mouth every evening.  ferrous sulfate 325 (65 FE) MG tablet Take 1 tablet (325 mg total) by mouth every Monday, Wednesday, and Friday.   Hugo Location manager Use daily for gait instability   hydrochlorothiazide 25 MG tablet Commonly known as: HYDRODIURIL Take 1 tablet (25 mg total) by mouth daily.   levothyroxine 88 MCG tablet Commonly known as: SYNTHROID Take 88 mcg by mouth daily before breakfast. What changed: Another medication with the same name was removed. Continue taking this medication, and follow the directions you see  here. Changed by: Worthy Rancher, MD   meclizine 25 MG tablet Commonly known as: ANTIVERT Take 25 mg by mouth 2 (two) times daily.   omega-3 acid ethyl esters 1 g capsule Commonly known as: Lovaza Take 1 capsule (1 g total) by mouth daily.   OneTouch Delica Lancets Fine Misc Use  To check blood glucose once a day.  DX:  Type 2 DM E11.9   OneTouch Verio test strip Generic drug: glucose blood USE 1 STRIP TO CHECK GLUCOSE ONCE DAILY   Pitavastatin Calcium 2 MG Tabs Commonly known as: Livalo Take 1 tablet (2 mg total) by mouth every evening.   potassium chloride SA 20 MEQ tablet Commonly known as: KLOR-CON Take 1 tablet (20 mEq total) by mouth daily. Take 1 tablet every other day        Objective:   BP 114/68   Pulse 88   Temp 98.9 F (37.2 C)   Ht _0  (1.702 m)   Wt 133 lb (60.3 kg)   SpO2 92%   BMI 20.83 kg/m   Wt Readings from Last 3 Encounters:  05/28/19 133 lb (60.3 kg)  02/25/19 136 lb 6.4 oz (61.9 kg)  11/04/18 141 lb 6.4 oz (64.1 kg)    Physical Exam Vitals and nursing note reviewed.  Constitutional:      General: She is not in acute distress.    Appearance: She is well-developed. She is not diaphoretic.  Eyes:     Conjunctiva/sclera: Conjunctivae normal.  Cardiovascular:     Rate and Rhythm: Normal rate and regular rhythm.     Heart sounds: Normal heart sounds. No murmur.  Pulmonary:     Effort: Pulmonary effort is normal. No respiratory distress.     Breath sounds: Normal breath sounds. No wheezing.  Musculoskeletal:        General: No tenderness. Normal range of motion.  Skin:    General: Skin is warm and dry.     Findings: Lesion (Small actinic keratosis x-rays in about 0.2 cm size on left forearm) present. No rash.  Neurological:     Mental Status: She is alert and oriented to person, place, and time.     Coordination: Coordination normal.  Psychiatric:        Behavior: Behavior normal.     Cryotherapy on left forearm, 3  -10-second bursts, patient tolerated well.  Assessment & Plan:   Problem List Items Addressed This Visit      Digestive   GERD (gastroesophageal reflux disease)   Relevant Orders   CBC with Differential/Platelet (Completed)     Endocrine   Hypothyroidism   Relevant Orders   TSH (Completed)   Type 2 diabetes mellitus treated without insulin ([place]) - Primary   Relevant Orders   Bayer DCA Hb A1c Waived (Completed)   CMP14+EGFR (Completed)     Other   Hyperlipidemia   Relevant Orders   Lipid panel (Completed)    Other Visit Diagnoses  Actinic keratosis          Continue current medication, no change. Follow up plan: Return in about 3 months (around 08/28/2019), or if symptoms worsen or fail to improve, for Recheck thyroid.  Counseling provided for all of the vaccine components Orders Placed This Encounter  Procedures  . Bayer DCA Hb A1c Waived  . CMP14+EGFR  . TSH  . CBC with Differential/Platelet  . Lipid panel    Caryl Pina, MD [facility] 06/03/2019, 10:40 PM

## 2019-05-29 LAB — CBC WITH DIFFERENTIAL/PLATELET
Basophils Absolute: 0.1 10*3/uL (ref 0.0–0.2)
Basos: 1 %
EOS (ABSOLUTE): 0.2 10*3/uL (ref 0.0–0.4)
Eos: 2 %
Hematocrit: 40.3 % (ref 34.0–46.6)
Hemoglobin: 13 g/dL (ref 11.1–15.9)
Immature Grans (Abs): 0 10*3/uL (ref 0.0–0.1)
Immature Granulocytes: 0 %
Lymphocytes Absolute: 1.6 10*3/uL (ref 0.7–3.1)
Lymphs: 18 %
MCH: 26.9 pg (ref 26.6–33.0)
MCHC: 32.3 g/dL (ref 31.5–35.7)
MCV: 83 fL (ref 79–97)
Monocytes Absolute: 0.6 10*3/uL (ref 0.1–0.9)
Monocytes: 6 %
Neutrophils Absolute: 6.6 10*3/uL (ref 1.4–7.0)
Neutrophils: 73 %
Platelets: 236 10*3/uL (ref 150–450)
RBC: 4.83 x10E6/uL (ref 3.77–5.28)
RDW: 14 % (ref 11.7–15.4)
WBC: 9.1 10*3/uL (ref 3.4–10.8)

## 2019-05-29 LAB — CMP14+EGFR
ALT: 5 IU/L (ref 0–32)
AST: 13 IU/L (ref 0–40)
Albumin/Globulin Ratio: 1.8 (ref 1.2–2.2)
Albumin: 4.1 g/dL (ref 3.6–4.6)
Alkaline Phosphatase: 61 IU/L (ref 39–117)
BUN/Creatinine Ratio: 33 — ABNORMAL HIGH (ref 12–28)
BUN: 25 mg/dL (ref 8–27)
Bilirubin Total: 0.2 mg/dL (ref 0.0–1.2)
CO2: 29 mmol/L (ref 20–29)
Calcium: 9.5 mg/dL (ref 8.7–10.3)
Chloride: 101 mmol/L (ref 96–106)
Creatinine, Ser: 0.75 mg/dL (ref 0.57–1.00)
GFR calc Af Amer: 84 mL/min/{1.73_m2} (ref >59)
GFR calc non Af Amer: 73 mL/min/{1.73_m2} (ref >59)
Globulin, Total: 2.3 g/dL (ref 1.5–4.5)
Glucose: 119 mg/dL — ABNORMAL HIGH (ref 65–99)
Potassium: 3.6 mmol/L (ref 3.5–5.2)
Sodium: 143 mmol/L (ref 134–144)
Total Protein: 6.4 g/dL (ref 6.0–8.5)

## 2019-05-29 LAB — LIPID PANEL
Chol/HDL Ratio: 2.7 ratio (ref 0.0–4.4)
Cholesterol, Total: 127 mg/dL (ref 100–199)
HDL: 47 mg/dL (ref >39)
LDL Chol Calc (NIH): 62 mg/dL (ref 0–99)
Triglycerides: 97 mg/dL (ref 0–149)
VLDL Cholesterol Cal: 18 mg/dL (ref 5–40)

## 2019-05-29 LAB — TSH: TSH: 0.393 u[IU]/mL — ABNORMAL LOW (ref 0.450–4.500)

## 2019-06-01 ENCOUNTER — Other Ambulatory Visit: Payer: Self-pay | Admitting: *Deleted

## 2019-06-01 ENCOUNTER — Telehealth: Payer: Self-pay | Admitting: *Deleted

## 2019-06-01 MED ORDER — LEVOTHYROXINE SODIUM 75 MCG PO TABS: 75.0000 ug | ORAL_TABLET | Freq: Every day | ORAL | 2 refills | Status: DC

## 2019-06-01 NOTE — Telephone Encounter (Signed)
Daughter requesting lab results,please.

## 2019-06-01 NOTE — Telephone Encounter (Signed)
Sent result to pools

## 2019-06-15 ENCOUNTER — Ambulatory Visit (INDEPENDENT_AMBULATORY_CARE_PROVIDER_SITE_OTHER): Payer: Medicare Other

## 2019-06-15 ENCOUNTER — Other Ambulatory Visit: Payer: Self-pay

## 2019-06-15 DIAGNOSIS — E538 Deficiency of other specified B group vitamins: Secondary | ICD-10-CM

## 2019-06-15 NOTE — Progress Notes (Signed)
Cyanocobalamin injection given to right deltoid.  Patient tolerated well. 

## 2019-07-20 ENCOUNTER — Ambulatory Visit (INDEPENDENT_AMBULATORY_CARE_PROVIDER_SITE_OTHER): Payer: Medicare Other | Admitting: *Deleted

## 2019-07-20 ENCOUNTER — Other Ambulatory Visit: Payer: Self-pay

## 2019-07-20 DIAGNOSIS — D508 Other iron deficiency anemias: Secondary | ICD-10-CM

## 2019-07-20 DIAGNOSIS — E538 Deficiency of other specified B group vitamins: Secondary | ICD-10-CM

## 2019-07-20 NOTE — Progress Notes (Signed)
B12 INJECTION GIVEN IM LEFT DELTOID, PATIENT TOLERATED WELL.

## 2019-08-04 ENCOUNTER — Other Ambulatory Visit: Payer: Self-pay

## 2019-08-04 MED ORDER — HYDROCHLOROTHIAZIDE 25 MG PO TABS: 25.0000 mg | ORAL_TABLET | Freq: Every day | ORAL | 0 refills | Status: DC

## 2019-08-24 ENCOUNTER — Other Ambulatory Visit: Payer: Self-pay

## 2019-08-24 ENCOUNTER — Ambulatory Visit (INDEPENDENT_AMBULATORY_CARE_PROVIDER_SITE_OTHER): Payer: Medicare Other

## 2019-08-24 DIAGNOSIS — E538 Deficiency of other specified B group vitamins: Secondary | ICD-10-CM | POA: Diagnosis not present

## 2019-08-24 NOTE — Progress Notes (Signed)
Pt tolerated B12 injection well. No concerns.

## 2019-08-28 ENCOUNTER — Other Ambulatory Visit: Payer: Self-pay

## 2019-08-28 ENCOUNTER — Encounter: Payer: Self-pay | Admitting: Family Medicine

## 2019-08-28 ENCOUNTER — Ambulatory Visit: Payer: Medicare Other | Admitting: Family Medicine

## 2019-08-28 VITALS — BP 133/56 | HR 88 | Temp 97.0°F | Ht 67.0 in | Wt 132.0 lb

## 2019-08-28 DIAGNOSIS — E782 Mixed hyperlipidemia: Secondary | ICD-10-CM | POA: Diagnosis not present

## 2019-08-28 DIAGNOSIS — E119 Type 2 diabetes mellitus without complications: Secondary | ICD-10-CM

## 2019-08-28 DIAGNOSIS — E034 Atrophy of thyroid (acquired): Secondary | ICD-10-CM | POA: Diagnosis not present

## 2019-08-28 DIAGNOSIS — K219 Gastro-esophageal reflux disease without esophagitis: Secondary | ICD-10-CM | POA: Diagnosis not present

## 2019-08-28 LAB — BAYER DCA HB A1C WAIVED: HB A1C (BAYER DCA - WAIVED): 6 % (ref <7.0)

## 2019-08-28 MED ORDER — FLUTICASONE PROPIONATE 50 MCG/ACT NA SUSP: 1.0000 | Freq: Two times a day (BID) | NASAL | 6 refills | Status: AC | PRN

## 2019-08-28 NOTE — Progress Notes (Signed)
BP (!) 133/56   Pulse 88   Temp (!) 97 F (36.1 C) (Temporal)   Ht 5\' 7"  (1.702 m)   Wt 132 lb (59.9 kg)   BMI 20.67 kg/m    Subjective:   [name], female    DOB: [birth year], 86 y.o.   MRN: [id number]  [name] is a 86 y.o. female presenting on 08/28/2019 for Medical Management of Chronic Issues   HPI Type 2 diabetes mellitus Patient comes in today for recheck of his diabetes. Patient has been currently taking no medication currently, has been diet controlled. Patient is not currently on an ACE inhibitor/ARB. Patient has not seen an ophthalmologist this year. Patient denies any issues with their feet. The symptom started onset as an adult hyperlipidemia and hypothyroidism ARE RELATED TO DM   Hypothyroidism recheck Patient is coming in for thyroid recheck today as well. They deny any issues with hair changes or heat or cold problems or diarrhea or constipation. They deny any chest pain or palpitations. They are currently on levothyroxine 75 micrograms   Hyperlipidemia Patient is coming in for recheck of his hyperlipidemia. The patient is currently taking Livalo and fish oil. They deny any issues with myalgias or history of liver damage from it. They deny any focal numbness or weakness or chest pain.   GERD Patient is currently on no medication currently.  She denies any major symptoms or abdominal pain or belching or burping. She denies any blood in her stool or lightheadedness or dizziness.   Relevant past medical, surgical, family and social history reviewed and updated as indicated. Interim medical history since our last visit reviewed. Allergies and medications reviewed and updated.  Review of Systems  Constitutional: Negative for chills and fever.  Eyes: Negative for visual disturbance.  Respiratory: Negative for chest tightness and shortness of breath.   Cardiovascular: Negative for chest pain and leg swelling.  Musculoskeletal: Negative  for back pain and gait problem.  Skin: Negative for rash.  Neurological: Negative for light-headedness and headaches.  Psychiatric/Behavioral: Negative for agitation and behavioral problems.  All other systems reviewed and are negative.   Per HPI unless specifically indicated above   Allergies as of 08/28/2019      Reactions   Ace Inhibitors Cough   Codeine Other (See Comments)   Sleepy and crazy      Medication List       Accurate as of August 28, 2019 11:24 AM. If you have any questions, ask your nurse or doctor.        acetaminophen 325 MG tablet Commonly known as: TYLENOL Take 650 mg by mouth every 6 (six) hours as needed (for pain).   albuterol 108 (90 Base) MCG/ACT inhaler Commonly known as: VENTOLIN HFA Inhale 1 puff into the lungs every 4 (four) hours as needed for wheezing.   albuterol (2.5 MG/3ML) 0.083% nebulizer solution Commonly known as: PROVENTIL USE ONE VIAL IN NEBULIZER EVERY 6 HOURS AS NEEDED   aspirin EC 81 MG tablet Take 81 mg by mouth daily.   budesonide 0.5 MG/2ML nebulizer solution Commonly known as: PULMICORT USE 1 VIAL IN NEBULIZER TWICE DAILY   CALCIUM PO Take 1 tablet by mouth every evening.   cholecalciferol 25 MCG (1000 UNIT) tablet Commonly known as: VITAMIN D Take 2,000 Units by mouth every evening.   ferrous sulfate 325 (65 FE) MG tablet Take 1 tablet (325 mg total) by mouth every Monday, Wednesday, and Friday.   [place] Bouchard  Location manager Use daily for gait instability   hydrochlorothiazide 25 MG tablet Commonly known as: HYDRODIURIL Take 1 tablet (25 mg total) by mouth daily.   levothyroxine 75 MCG tablet Commonly known as: SYNTHROID Take 1 tablet (75 mcg total) by mouth daily.   meclizine 25 MG tablet Commonly known as: ANTIVERT Take 25 mg by mouth 2 (two) times daily.   omega-3 acid ethyl esters 1 g capsule Commonly known as: Lovaza Take 1 capsule (1 g total) by mouth daily.   OneTouch Delica Lancets Fine  Misc Use  To check blood glucose once a day.  DX:  Type 2 DM E11.9   OneTouch Verio test strip Generic drug: glucose blood USE 1 STRIP TO CHECK GLUCOSE ONCE DAILY   Pitavastatin Calcium 2 MG Tabs Commonly known as: Livalo Take 1 tablet (2 mg total) by mouth every evening.   potassium chloride SA 20 MEQ tablet Commonly known as: KLOR-CON Take 1 tablet (20 mEq total) by mouth daily. Take 1 tablet every other day        Objective:   BP (!) 133/56   Pulse 88   Temp (!) 97 F (36.1 C) (Temporal)   Ht 5\' 7"  (1.702 m)   Wt 132 lb (59.9 kg)   BMI 20.67 kg/m   Wt Readings from Last 3 Encounters:  08/28/19 132 lb (59.9 kg)  05/28/19 133 lb (60.3 kg)  02/25/19 136 lb 6.4 oz (61.9 kg)    Physical Exam Vitals and nursing note reviewed.  Constitutional:      General: She is not in acute distress.    Appearance: She is well-developed. She is not diaphoretic.  Eyes:     Conjunctiva/sclera: Conjunctivae normal.  Cardiovascular:     Rate and Rhythm: Normal rate and regular rhythm.     Heart sounds: Normal heart sounds. No murmur heard.   Pulmonary:     Effort: Pulmonary effort is normal. No respiratory distress.     Breath sounds: Normal breath sounds. No wheezing.  Musculoskeletal:        General: No tenderness. Normal range of motion.  Skin:    General: Skin is warm and dry.     Findings: No rash.  Neurological:     Mental Status: She is alert and oriented to person, place, and time.     Coordination: Coordination normal.  Psychiatric:        Behavior: Behavior normal.       Assessment & Plan:   Problem List Items Addressed This Visit      Digestive   GERD (gastroesophageal reflux disease)     Endocrine   Hypothyroidism   Relevant Orders   TSH   Type 2 diabetes mellitus treated without insulin ([place]) - Primary   Relevant Orders   Microalbumin / creatinine urine ratio   Bayer DCA Hb A1c Waived     Other   Hyperlipidemia      A1c is 6.0, she continues  to remain borderline, will continue to keep an eye on it. Follow up plan: Return in about 3 months (around 11/28/2019), or if symptoms worsen or fail to improve, for Hypertension and cholesterol and diabetes and thyroid.  Counseling provided for all of the vaccine components Orders Placed This Encounter  Procedures  . Microalbumin / creatinine urine ratio  . Bayer DCA Hb A1c Waived  . TSH    Caryl Pina, MD [facility] 08/28/2019, 11:24 AM

## 2019-08-29 LAB — MICROALBUMIN / CREATININE URINE RATIO
Creatinine, Urine: 104.2 mg/dL
Microalb/Creat Ratio: 24 mg/g creat (ref 0–29)
Microalbumin, Urine: 25.4 ug/mL

## 2019-08-29 LAB — TSH: TSH: 4.87 u[IU]/mL — ABNORMAL HIGH (ref 0.450–4.500)

## 2019-09-04 ENCOUNTER — Telehealth: Payer: Self-pay | Admitting: Family Medicine

## 2019-09-04 MED ORDER — LEVOTHYROXINE SODIUM 88 MCG PO TABS
88.0000 ug | ORAL_TABLET | Freq: Every day | ORAL | 1 refills | Status: DC
Start: 2019-09-04 — End: 2019-11-27

## 2019-09-04 NOTE — Telephone Encounter (Signed)
-----   Message from Katie Kaufmann, LPN sent at 7/40/8144  8:58 AM EDT ----- Pt is taking her medication everyday and is aware of lab results

## 2019-09-04 NOTE — Telephone Encounter (Signed)
Placed new order for higher dose of levothyroxine because labs were off.

## 2019-09-07 NOTE — Telephone Encounter (Signed)
Patient aware, script is ready. 

## 2019-09-24 ENCOUNTER — Ambulatory Visit (INDEPENDENT_AMBULATORY_CARE_PROVIDER_SITE_OTHER): Payer: Medicare Other | Admitting: *Deleted

## 2019-09-24 ENCOUNTER — Other Ambulatory Visit: Payer: Self-pay

## 2019-09-24 DIAGNOSIS — E538 Deficiency of other specified B group vitamins: Secondary | ICD-10-CM

## 2019-09-24 NOTE — Progress Notes (Signed)
Pt tolerated well

## 2019-10-15 ENCOUNTER — Other Ambulatory Visit: Payer: Self-pay | Admitting: Family Medicine

## 2019-10-21 ENCOUNTER — Other Ambulatory Visit: Payer: Self-pay | Admitting: Family Medicine

## 2019-10-26 ENCOUNTER — Other Ambulatory Visit: Payer: Self-pay

## 2019-10-26 ENCOUNTER — Ambulatory Visit (INDEPENDENT_AMBULATORY_CARE_PROVIDER_SITE_OTHER): Payer: Medicare Other | Admitting: Family Medicine

## 2019-10-26 DIAGNOSIS — E538 Deficiency of other specified B group vitamins: Secondary | ICD-10-CM

## 2019-11-26 ENCOUNTER — Other Ambulatory Visit: Payer: Self-pay

## 2019-11-26 ENCOUNTER — Ambulatory Visit (INDEPENDENT_AMBULATORY_CARE_PROVIDER_SITE_OTHER): Payer: Medicare Other | Admitting: *Deleted

## 2019-11-26 DIAGNOSIS — D508 Other iron deficiency anemias: Secondary | ICD-10-CM

## 2019-11-26 DIAGNOSIS — E538 Deficiency of other specified B group vitamins: Secondary | ICD-10-CM

## 2019-11-26 NOTE — Progress Notes (Signed)
Patient in today for monthly B12 injection. 1000 mcg given in left deltoid. Patient tolerated well.

## 2019-11-27 ENCOUNTER — Encounter: Payer: Self-pay | Admitting: Family Medicine

## 2019-11-27 ENCOUNTER — Ambulatory Visit: Payer: Medicare Other | Admitting: Family Medicine

## 2019-11-27 VITALS — BP 129/60 | HR 73 | Temp 97.7°F | Ht 67.0 in | Wt 127.0 lb

## 2019-11-27 DIAGNOSIS — E119 Type 2 diabetes mellitus without complications: Secondary | ICD-10-CM

## 2019-11-27 DIAGNOSIS — J449 Chronic obstructive pulmonary disease, unspecified: Secondary | ICD-10-CM

## 2019-11-27 DIAGNOSIS — K219 Gastro-esophageal reflux disease without esophagitis: Secondary | ICD-10-CM | POA: Diagnosis not present

## 2019-11-27 DIAGNOSIS — E782 Mixed hyperlipidemia: Secondary | ICD-10-CM | POA: Diagnosis not present

## 2019-11-27 DIAGNOSIS — E034 Atrophy of thyroid (acquired): Secondary | ICD-10-CM

## 2019-11-27 LAB — BAYER DCA HB A1C WAIVED: HB A1C (BAYER DCA - WAIVED): 5.9 % (ref <7.0)

## 2019-11-27 MED ORDER — OMEGA-3-ACID ETHYL ESTERS 1 G PO CAPS: 1.0000 g | ORAL_CAPSULE | Freq: Every day | ORAL | 3 refills | Status: AC

## 2019-11-27 MED ORDER — LEVOTHYROXINE SODIUM 88 MCG PO TABS: 88.0000 ug | ORAL_TABLET | Freq: Every day | ORAL | 3 refills | Status: AC

## 2019-11-27 MED ORDER — HYDROCHLOROTHIAZIDE 25 MG PO TABS: 25.0000 mg | ORAL_TABLET | Freq: Every day | ORAL | 3 refills | Status: DC

## 2019-11-27 MED ORDER — LIVALO 2 MG PO TABS: 2.0000 mg | ORAL_TABLET | Freq: Every evening | ORAL | 3 refills | Status: AC

## 2019-11-27 MED ORDER — ALBUTEROL SULFATE (2.5 MG/3ML) 0.083% IN NEBU: 2.5000 mg | INHALATION_SOLUTION | Freq: Four times a day (QID) | RESPIRATORY_TRACT | 3 refills | Status: DC | PRN

## 2019-11-27 NOTE — Progress Notes (Signed)
 BP 129/60   Pulse 73   Temp 97.7 F (36.5 C)   Ht 5' 7" (1.702 m)   Wt 127 lb (57.6 kg)   SpO2 91%   BMI 19.89 kg/m    Subjective:   [name], female    DOB: 03/30/1931, 86 y.o.   MRN: [id number]  [name] is a 86 y.o. female presenting on 11/27/2019 for Medical Management of Chronic Issues and Diabetes   HPI Type 2 diabetes mellitus Patient comes in today for recheck of his diabetes. Patient has been currently taking no medication. Patient is not currently on an ACE inhibitor/ARB. Patient has not seen an ophthalmologist this year. Patient denies any issues with their feet. The symptom started onset as an adult hypothyroidism and hyperlipidemia and GERD ARE RELATED TO DM   Hypothyroidism recheck Patient is coming in for thyroid recheck today as well. They deny any issues with hair changes or heat or cold problems or diarrhea or constipation. They deny any chest pain or palpitations. They are currently on levothyroxine 88 micrograms   Hyperlipidemia Patient is coming in for recheck of his hyperlipidemia. The patient is currently taking Livalo. They deny any issues with myalgias or history of liver damage from it. They deny any focal numbness or weakness or chest pain.   GERD Patient is currently on no medication currently.  She denies any major symptoms or abdominal pain or belching or burping. She denies any blood in her stool or lightheadedness or dizziness.   COPD Patient is coming in for COPD recheck today.  He is currently on albuterol and Pulmicort.  He has a mild chronic cough but denies any major coughing spells or wheezing spells.  He has 0nighttime symptoms per week and 1daytime symptoms per week currently.   Relevant past medical, surgical, family and social history reviewed and updated as indicated. Interim medical history since our last visit reviewed. Allergies and medications reviewed and updated.  Review of Systems   Constitutional: Negative for chills and fever.  Eyes: Negative for visual disturbance.  Respiratory: Negative for chest tightness and shortness of breath.   Cardiovascular: Negative for chest pain and leg swelling.  Musculoskeletal: Negative for back pain and gait problem.  Skin: Negative for rash.  Neurological: Negative for light-headedness and headaches.  Psychiatric/Behavioral: Negative for agitation and behavioral problems.  All other systems reviewed and are negative.   Per HPI unless specifically indicated above   Allergies as of 11/27/2019      Reactions   Ace Inhibitors Cough   Codeine Other (See Comments)   Sleepy and crazy      Medication List       Accurate as of November 27, 2019 10:06 AM. If you have any questions, ask your nurse or doctor.        STOP taking these medications   potassium chloride SA 20 MEQ tablet Commonly known as: KLOR-CON Stopped by: Joshua A Dettinger, MD     TAKE these medications   acetaminophen 325 MG tablet Commonly known as: TYLENOL Take 650 mg by mouth every 6 (six) hours as needed (for pain).   albuterol 108 (90 Base) MCG/ACT inhaler Commonly known as: VENTOLIN HFA Inhale 1 puff into the lungs every 4 (four) hours as needed for wheezing. What changed: Another medication with the same name was changed. Make sure you understand how and when to take each. Changed by: Joshua A Dettinger, MD   albuterol (2.5 MG/3ML) 0.083% nebulizer   solution Commonly known as: PROVENTIL Take 3 mLs (2.5 mg total) by nebulization every 6 (six) hours as needed for wheezing or shortness of breath. What changed: See the new instructions. Changed by: Fransisca Kaufmann Torre Schaumburg, MD   aspirin EC 81 MG tablet Take 81 mg by mouth daily.   budesonide 0.5 MG/2ML nebulizer solution Commonly known as: PULMICORT USE 1 VIAL IN NEBULIZER TWICE DAILY   CALCIUM PO Take 1 tablet by mouth every evening.   cholecalciferol 25 MCG (1000 UNIT) tablet Commonly known  as: VITAMIN D Take 2,000 Units by mouth every evening.   ferrous sulfate 325 (65 FE) MG tablet Take 1 tablet (325 mg total) by mouth every Monday, Wednesday, and Friday.   fluticasone 50 MCG/ACT nasal spray Commonly known as: FLONASE Place 1 spray into both nostrils 2 (two) times daily as needed for allergies or rhinitis.   Hugo Location manager Use daily for gait instability   hydrochlorothiazide 25 MG tablet Commonly known as: HYDRODIURIL Take 1 tablet (25 mg total) by mouth daily.   levothyroxine 88 MCG tablet Commonly known as: SYNTHROID Take 1 tablet (88 mcg total) by mouth daily.   Livalo 2 MG Tabs Generic drug: Pitavastatin Calcium Take 1 tablet (2 mg total) by mouth every evening.   meclizine 25 MG tablet Commonly known as: ANTIVERT Take 25 mg by mouth 2 (two) times daily.   omega-3 acid ethyl esters 1 g capsule Commonly known as: Lovaza Take 1 capsule (1 g total) by mouth daily.   OneTouch Delica Lancets Fine Misc Use  To check blood glucose once a day.  DX:  Type 2 DM E11.9   OneTouch Verio test strip Generic drug: glucose blood USE 1 STRIP TO CHECK GLUCOSE ONCE DAILY        Objective:   BP 129/60   Pulse 73   Temp 97.7 F (36.5 C)   Ht 5' 7" (1.702 m)   Wt 127 lb (57.6 kg)   SpO2 91%   BMI 19.89 kg/m   Wt Readings from Last 3 Encounters:  11/27/19 127 lb (57.6 kg)  08/28/19 132 lb (59.9 kg)  05/28/19 133 lb (60.3 kg)    Physical Exam Vitals and nursing note reviewed.  Constitutional:      General: She is not in acute distress.    Appearance: She is well-developed. She is not diaphoretic.  Eyes:     Conjunctiva/sclera: Conjunctivae normal.  Cardiovascular:     Rate and Rhythm: Normal rate and regular rhythm.     Heart sounds: Normal heart sounds. No murmur heard.   Pulmonary:     Effort: Pulmonary effort is normal. No respiratory distress.     Breath sounds: Normal breath sounds. No wheezing.  Musculoskeletal:         General: No tenderness. Normal range of motion.  Skin:    General: Skin is warm and dry.     Findings: No rash.  Neurological:     Mental Status: She is alert and oriented to person, place, and time.     Coordination: Coordination normal.  Psychiatric:        Behavior: Behavior normal.       Assessment & Plan:   Problem List Items Addressed This Visit      Respiratory   COPD (chronic obstructive pulmonary disease) with chronic bronchitis (HCC)   Relevant Medications   albuterol (PROVENTIL) (2.5 MG/3ML) 0.083% nebulizer solution     Digestive   GERD (gastroesophageal reflux disease)  Relevant Orders   CBC with Differential/Platelet (Completed)     Endocrine   Hypothyroidism   Relevant Medications   levothyroxine (SYNTHROID) 88 MCG tablet   Other Relevant Orders   TSH (Completed)   Type 2 diabetes mellitus treated without insulin (HCC) - Primary   Relevant Medications   Pitavastatin Calcium (LIVALO) 2 MG TABS   Other Relevant Orders   Bayer DCA Hb A1c Waived (Completed)   CMP14+EGFR (Completed)     Other   Hyperlipidemia   Relevant Medications   hydrochlorothiazide (HYDRODIURIL) 25 MG tablet   Pitavastatin Calcium (LIVALO) 2 MG TABS   omega-3 acid ethyl esters (LOVAZA) 1 g capsule   Other Relevant Orders   Lipid panel (Completed)    A1c 5.9, still doing good, no change in medication.  We will check cholesterol and blood work today.  Follow up plan: Return in about 3 months (around 02/26/2020), or if symptoms worsen or fail to improve, for Thyroid and prediabetes and cholesterol.  Counseling provided for all of the vaccine components Orders Placed This Encounter  Procedures  . Bayer DCA Hb A1c Waived  . CBC with Differential/Platelet  . CMP14+EGFR  . Lipid panel  . TSH    Caryl Pina, MD [facility] 11/27/2019, 10:06 AM

## 2019-11-28 LAB — CMP14+EGFR
ALT: 4 IU/L (ref 0–32)
AST: 11 IU/L (ref 0–40)
Albumin/Globulin Ratio: 1.3 (ref 1.2–2.2)
Albumin: 3.7 g/dL (ref 3.6–4.6)
Alkaline Phosphatase: 61 IU/L (ref 48–121)
BUN/Creatinine Ratio: 22 (ref 12–28)
BUN: 17 mg/dL (ref 8–27)
Bilirubin Total: 0.3 mg/dL (ref 0.0–1.2)
CO2: 30 mmol/L — ABNORMAL HIGH (ref 20–29)
Calcium: 9.4 mg/dL (ref 8.7–10.3)
Chloride: 95 mmol/L — ABNORMAL LOW (ref 96–106)
Creatinine, Ser: 0.78 mg/dL (ref 0.57–1.00)
GFR calc Af Amer: 80 mL/min/{1.73_m2} (ref >59)
GFR calc non Af Amer: 69 mL/min/{1.73_m2} (ref >59)
Globulin, Total: 2.9 g/dL (ref 1.5–4.5)
Glucose: 118 mg/dL — ABNORMAL HIGH (ref 65–99)
Potassium: 3.2 mmol/L — ABNORMAL LOW (ref 3.5–5.2)
Sodium: 140 mmol/L (ref 134–144)
Total Protein: 6.6 g/dL (ref 6.0–8.5)

## 2019-11-28 LAB — LIPID PANEL
Chol/HDL Ratio: 2.6 ratio (ref 0.0–4.4)
Cholesterol, Total: 120 mg/dL (ref 100–199)
HDL: 46 mg/dL (ref >39)
LDL Chol Calc (NIH): 57 mg/dL (ref 0–99)
Triglycerides: 89 mg/dL (ref 0–149)
VLDL Cholesterol Cal: 17 mg/dL (ref 5–40)

## 2019-11-28 LAB — CBC WITH DIFFERENTIAL/PLATELET
Basophils Absolute: 0.1 10*3/uL (ref 0.0–0.2)
Basos: 1 %
EOS (ABSOLUTE): 0.2 10*3/uL (ref 0.0–0.4)
Eos: 2 %
Hematocrit: 34.2 % (ref 34.0–46.6)
Hemoglobin: 10.9 g/dL — ABNORMAL LOW (ref 11.1–15.9)
Immature Grans (Abs): 0 10*3/uL (ref 0.0–0.1)
Immature Granulocytes: 0 %
Lymphocytes Absolute: 1.6 10*3/uL (ref 0.7–3.1)
Lymphs: 16 %
MCH: 24.7 pg — ABNORMAL LOW (ref 26.6–33.0)
MCHC: 31.9 g/dL (ref 31.5–35.7)
MCV: 77 fL — ABNORMAL LOW (ref 79–97)
Monocytes Absolute: 0.5 10*3/uL (ref 0.1–0.9)
Monocytes: 5 %
Neutrophils Absolute: 7.5 10*3/uL — ABNORMAL HIGH (ref 1.4–7.0)
Neutrophils: 76 %
Platelets: 291 10*3/uL (ref 150–450)
RBC: 4.42 x10E6/uL (ref 3.77–5.28)
RDW: 14 % (ref 11.7–15.4)
WBC: 10 10*3/uL (ref 3.4–10.8)

## 2019-11-28 LAB — TSH: TSH: 0.501 u[IU]/mL (ref 0.450–4.500)

## 2019-12-15 ENCOUNTER — Telehealth: Payer: Self-pay | Admitting: Family Medicine

## 2019-12-16 NOTE — Telephone Encounter (Signed)
Not necessarily because the way the MCV is down to 77 shows that she does have iron deficiency anemia so I would go ahead and do the iron supplementation over-the-counter.  We will recheck it in the future

## 2019-12-16 NOTE — Telephone Encounter (Signed)
Daughter aware.

## 2019-12-28 ENCOUNTER — Ambulatory Visit (INDEPENDENT_AMBULATORY_CARE_PROVIDER_SITE_OTHER): Payer: Medicare Other

## 2019-12-28 ENCOUNTER — Other Ambulatory Visit: Payer: Self-pay

## 2019-12-28 DIAGNOSIS — Z23 Encounter for immunization: Secondary | ICD-10-CM

## 2020-01-18 DIAGNOSIS — J440 Chronic obstructive pulmonary disease with acute lower respiratory infection: Secondary | ICD-10-CM | POA: Diagnosis not present

## 2020-01-21 DIAGNOSIS — L84 Corns and callosities: Secondary | ICD-10-CM | POA: Diagnosis not present

## 2020-01-21 DIAGNOSIS — B351 Tinea unguium: Secondary | ICD-10-CM | POA: Diagnosis not present

## 2020-01-21 DIAGNOSIS — M79676 Pain in unspecified toe(s): Secondary | ICD-10-CM | POA: Diagnosis not present

## 2020-01-21 DIAGNOSIS — I70203 Unspecified atherosclerosis of native arteries of extremities, bilateral legs: Secondary | ICD-10-CM | POA: Diagnosis not present

## 2020-01-28 ENCOUNTER — Ambulatory Visit (INDEPENDENT_AMBULATORY_CARE_PROVIDER_SITE_OTHER): Payer: Medicare Other | Admitting: *Deleted

## 2020-01-28 ENCOUNTER — Other Ambulatory Visit: Payer: Self-pay

## 2020-01-28 DIAGNOSIS — E538 Deficiency of other specified B group vitamins: Secondary | ICD-10-CM | POA: Diagnosis not present

## 2020-02-22 ENCOUNTER — Telehealth: Payer: Self-pay

## 2020-02-23 NOTE — Telephone Encounter (Signed)
Patient aware and verbalizes understanding. 

## 2020-02-23 NOTE — Telephone Encounter (Signed)
Yes I did state: But it is to call them to get an appointment so we can assess how sick she is so we can treat it.  If she cannot get in a Covid clinic appointment then please schedule her virtual appointment.

## 2020-02-26 ENCOUNTER — Ambulatory Visit (INDEPENDENT_AMBULATORY_CARE_PROVIDER_SITE_OTHER): Payer: Medicare Other | Admitting: Family Medicine

## 2020-02-26 ENCOUNTER — Other Ambulatory Visit: Payer: Self-pay

## 2020-02-26 ENCOUNTER — Encounter: Payer: Self-pay | Admitting: Family Medicine

## 2020-02-26 VITALS — BP 118/68 | HR 84 | Ht 67.0 in | Wt 123.0 lb

## 2020-02-26 DIAGNOSIS — K219 Gastro-esophageal reflux disease without esophagitis: Secondary | ICD-10-CM

## 2020-02-26 DIAGNOSIS — E119 Type 2 diabetes mellitus without complications: Secondary | ICD-10-CM

## 2020-02-26 DIAGNOSIS — J441 Chronic obstructive pulmonary disease with (acute) exacerbation: Secondary | ICD-10-CM

## 2020-02-26 DIAGNOSIS — E782 Mixed hyperlipidemia: Secondary | ICD-10-CM

## 2020-02-26 DIAGNOSIS — E034 Atrophy of thyroid (acquired): Secondary | ICD-10-CM

## 2020-02-26 LAB — BAYER DCA HB A1C WAIVED: HB A1C (BAYER DCA - WAIVED): 6 % (ref <7.0)

## 2020-02-26 MED ORDER — AMOXICILLIN 500 MG PO CAPS: 500.0000 mg | ORAL_CAPSULE | Freq: Two times a day (BID) | ORAL | 1 refills | Status: DC

## 2020-02-26 MED ORDER — PREDNISONE 20 MG PO TABS: ORAL_TABLET | ORAL | 0 refills | Status: DC

## 2020-02-26 NOTE — Addendum Note (Signed)
Addended by: Liliane Bade on: 02/26/2020 10:28 AM   Modules accepted: Orders

## 2020-02-26 NOTE — Progress Notes (Signed)
BP 118/68   Pulse 84   Ht 5\' 7"  (1.702 m)   Wt 123 lb (55.8 kg)   SpO2 93%   BMI 19.26 kg/m    Subjective:   [name], female    DOB: January 27, 1932, 86 y.o.   MRN: [id number]  [name] is a 86 y.o. female presenting on 02/26/2020 for Medical Management of Chronic Issues and Diabetes   HPI Type 2 diabetes mellitus Patient comes in today for recheck of his diabetes. Patient has been currently taking no medication currently has been borderline and prediabetes, A1c 6.0.. Patient is not currently on an ACE inhibitor/ARB. Patient has seen an ophthalmologist this year. Patient denies any issues with their feet. The symptom started onset as an adult hypothyroidism and hyperlipidemia and GERD ARE RELATED TO DM   Patient is coming in complaining of chest congestion and feelings of bronchitis and cough and tightness in her chest that is been going on over the past 5 days.  She denies any fevers but does have some chills and body aches at times.  She denies any shortness of breath but just has the wheezing.  She denies any sick contacts that she knows of.  She has been using her albuterol inhalers to help with it.  GERD Patient is currently on no medication currently for GERD, seems to be doing well..  She denies any major symptoms or abdominal pain or belching or burping. She denies any blood in her stool or lightheadedness or dizziness.   Hypothyroidism recheck Patient is coming in for thyroid recheck today as well. They deny any issues with hair changes or heat or cold problems or diarrhea or constipation. They deny any chest pain or palpitations. They are currently on levothyroxine 88 micrograms   Hyperlipidemia Patient is coming in for recheck of his hyperlipidemia. The patient is currently taking simvastatin. They deny any issues with myalgias or history of liver damage from it. They deny any focal numbness or weakness or chest pain.   Relevant past medical,  surgical, family and social history reviewed and updated as indicated. Interim medical history since our last visit reviewed. Allergies and medications reviewed and updated.  Review of Systems  Constitutional: Negative for chills and fever.  HENT: Positive for congestion. Negative for ear discharge and ear pain.   Eyes: Negative for redness and visual disturbance.  Respiratory: Positive for cough, chest tightness and wheezing. Negative for shortness of breath.   Cardiovascular: Negative for chest pain and leg swelling.  Genitourinary: Negative for difficulty urinating and dysuria.  Musculoskeletal: Negative for back pain and gait problem.  Skin: Negative for rash.  Neurological: Negative for light-headedness and headaches.  Psychiatric/Behavioral: Negative for agitation and behavioral problems.  All other systems reviewed and are negative.   Per HPI unless specifically indicated above   Allergies as of 02/26/2020      Reactions   Ace Inhibitors Cough   Codeine Other (See Comments)   Sleepy and crazy      Medication List       Accurate as of February 26, 2020  9:50 AM. If you have any questions, ask your nurse or doctor.        STOP taking these medications   CALCIUM PO Stopped by: Fransisca Kaufmann Hafiz Irion, MD     TAKE these medications   acetaminophen 325 MG tablet Commonly known as: TYLENOL Take 650 mg by mouth every 6 (six) hours as needed (for pain).  albuterol 108 (90 Base) MCG/ACT inhaler Commonly known as: VENTOLIN HFA Inhale 1 puff into the lungs every 4 (four) hours as needed for wheezing.   albuterol (2.5 MG/3ML) 0.083% nebulizer solution Commonly known as: PROVENTIL Take 3 mLs (2.5 mg total) by nebulization every 6 (six) hours as needed for wheezing or shortness of breath.   aspirin EC 81 MG tablet Take 81 mg by mouth daily.   budesonide 0.5 MG/2ML nebulizer solution Commonly known as: PULMICORT USE 1 VIAL IN NEBULIZER TWICE DAILY   cholecalciferol 25  MCG (1000 UNIT) tablet Commonly known as: VITAMIN D Take 2,000 Units by mouth every evening.   ferrous sulfate 325 (65 FE) MG tablet Take 1 tablet (325 mg total) by mouth every Monday, Wednesday, and Friday.   fluticasone 50 MCG/ACT nasal spray Commonly known as: FLONASE Place 1 spray into both nostrils 2 (two) times daily as needed for allergies or rhinitis.   Hugo Location manager Use daily for gait instability   hydrochlorothiazide 25 MG tablet Commonly known as: HYDRODIURIL Take 1 tablet (25 mg total) by mouth daily.   levothyroxine 88 MCG tablet Commonly known as: SYNTHROID Take 1 tablet (88 mcg total) by mouth daily.   Livalo 2 MG Tabs Generic drug: Pitavastatin Calcium Take 1 tablet (2 mg total) by mouth every evening.   meclizine 25 MG tablet Commonly known as: ANTIVERT Take 25 mg by mouth 2 (two) times daily.   omega-3 acid ethyl esters 1 g capsule Commonly known as: Lovaza Take 1 capsule (1 g total) by mouth daily.   OneTouch Delica Lancets Fine Misc Use  To check blood glucose once a day.  DX:  Type 2 DM E11.9   OneTouch Verio test strip Generic drug: glucose blood USE 1 STRIP TO CHECK GLUCOSE ONCE DAILY        Objective:   BP 118/68   Pulse 84   Ht 5\' 7"  (1.702 m)   Wt 123 lb (55.8 kg)   SpO2 93%   BMI 19.26 kg/m   Wt Readings from Last 3 Encounters:  02/26/20 123 lb (55.8 kg)  11/27/19 127 lb (57.6 kg)  08/28/19 132 lb (59.9 kg)    Physical Exam Vitals and nursing note reviewed.  Constitutional:      General: She is not in acute distress.    Appearance: She is well-developed and well-nourished. She is not diaphoretic.  Eyes:     Extraocular Movements: EOM normal.     Conjunctiva/sclera: Conjunctivae normal.  Cardiovascular:     Rate and Rhythm: Normal rate and regular rhythm.     Pulses: Intact distal pulses.     Heart sounds: Normal heart sounds. No murmur heard.   Pulmonary:     Effort: Pulmonary effort is normal. No  respiratory distress.     Breath sounds: Normal breath sounds. No wheezing, rhonchi or rales.  Chest:     Chest wall: No tenderness.  Abdominal:     General: Abdomen is flat.  Musculoskeletal:        General: No tenderness or edema. Normal range of motion.  Skin:    General: Skin is warm and dry.     Findings: No rash.  Neurological:     Mental Status: She is alert and oriented to person, place, and time.     Coordination: Coordination normal.  Psychiatric:        Mood and Affect: Mood and affect normal.        Behavior: Behavior normal.  Assessment & Plan:   Problem List Items Addressed This Visit      Digestive   GERD (gastroesophageal reflux disease)   Relevant Orders   CBC with Differential/Platelet     Endocrine   Hypothyroidism   Relevant Orders   TSH   Type 2 diabetes mellitus treated without insulin ([place]) - Primary   Relevant Orders   Bayer DCA Hb A1c Waived     Other   Hyperlipidemia    Other Visit Diagnoses    COPD exacerbation ([place])       Relevant Medications   predniSONE (DELTASONE) 20 MG tablet   amoxicillin (AMOXIL) 500 MG capsule   Other Relevant Orders   Novel Coronavirus, NAA ([facility])      Will treat like COPD exacerbation, will check blood work, A1c looks good at 6.0.  Will await Covid testing, recommended quarantine until then.  Continue inhalers and sent amoxicillin and prednisone. Follow up plan: Return in about 3 months (around 05/26/2020), or if symptoms worsen or fail to improve, for Diabetes and thyroid recheck.  Counseling provided for all of the vaccine components Orders Placed This Encounter  Procedures  . Bayer [facility] Hb A1c Waived    Caryl Pina, MD [facility] 02/26/2020, 9:50 AM

## 2020-02-27 LAB — CBC WITH DIFFERENTIAL/PLATELET
Basophils Absolute: 0.1 10*3/uL (ref 0.0–0.2)
Basos: 1 %
EOS (ABSOLUTE): 0.4 10*3/uL (ref 0.0–0.4)
Eos: 5 %
Hematocrit: 34.7 % (ref 34.0–46.6)
Hemoglobin: 10.9 g/dL — ABNORMAL LOW (ref 11.1–15.9)
Immature Grans (Abs): 0 10*3/uL (ref 0.0–0.1)
Immature Granulocytes: 0 %
Lymphocytes Absolute: 2 10*3/uL (ref 0.7–3.1)
Lymphs: 21 %
MCH: 23.6 pg — ABNORMAL LOW (ref 26.6–33.0)
MCHC: 31.4 g/dL — ABNORMAL LOW (ref 31.5–35.7)
MCV: 75 fL — ABNORMAL LOW (ref 79–97)
Monocytes Absolute: 0.6 10*3/uL (ref 0.1–0.9)
Monocytes: 7 %
Neutrophils Absolute: 6.1 10*3/uL (ref 1.4–7.0)
Neutrophils: 66 %
Platelets: 339 10*3/uL (ref 150–450)
RBC: 4.61 x10E6/uL (ref 3.77–5.28)
RDW: 15.8 % — ABNORMAL HIGH (ref 11.7–15.4)
WBC: 9.2 10*3/uL (ref 3.4–10.8)

## 2020-02-27 LAB — TSH: TSH: 0.455 u[IU]/mL (ref 0.450–4.500)

## 2020-02-28 LAB — SARS-COV-2, NAA 2 DAY TAT

## 2020-02-28 LAB — NOVEL CORONAVIRUS, NAA: SARS-CoV-2, NAA: NOT DETECTED

## 2020-03-01 ENCOUNTER — Ambulatory Visit (INDEPENDENT_AMBULATORY_CARE_PROVIDER_SITE_OTHER): Payer: Medicare Other | Admitting: *Deleted

## 2020-03-01 ENCOUNTER — Other Ambulatory Visit: Payer: Self-pay

## 2020-03-01 DIAGNOSIS — D508 Other iron deficiency anemias: Secondary | ICD-10-CM | POA: Diagnosis not present

## 2020-03-01 DIAGNOSIS — E538 Deficiency of other specified B group vitamins: Secondary | ICD-10-CM

## 2020-03-01 NOTE — Progress Notes (Signed)
PATIENT IN TODAY FOR MONTHLY B12 INJECTION. 1000 MCG GIVEN IM IN RIGHT DELTOID. PATIENT TOLERATED WELL.

## 2020-03-03 DIAGNOSIS — J440 Chronic obstructive pulmonary disease with acute lower respiratory infection: Secondary | ICD-10-CM | POA: Diagnosis not present

## 2020-03-29 DIAGNOSIS — M79676 Pain in unspecified toe(s): Secondary | ICD-10-CM | POA: Diagnosis not present

## 2020-03-29 DIAGNOSIS — L84 Corns and callosities: Secondary | ICD-10-CM | POA: Diagnosis not present

## 2020-03-29 DIAGNOSIS — B351 Tinea unguium: Secondary | ICD-10-CM | POA: Diagnosis not present

## 2020-03-29 DIAGNOSIS — E1142 Type 2 diabetes mellitus with diabetic polyneuropathy: Secondary | ICD-10-CM | POA: Diagnosis not present

## 2020-04-04 ENCOUNTER — Ambulatory Visit: Payer: Medicare Other

## 2020-04-11 ENCOUNTER — Ambulatory Visit: Payer: Medicare Other

## 2020-04-14 ENCOUNTER — Other Ambulatory Visit: Payer: Self-pay

## 2020-04-14 ENCOUNTER — Ambulatory Visit (INDEPENDENT_AMBULATORY_CARE_PROVIDER_SITE_OTHER): Payer: Medicare Other | Admitting: *Deleted

## 2020-04-14 DIAGNOSIS — E538 Deficiency of other specified B group vitamins: Secondary | ICD-10-CM | POA: Diagnosis not present

## 2020-05-16 ENCOUNTER — Other Ambulatory Visit: Payer: Self-pay

## 2020-05-16 ENCOUNTER — Ambulatory Visit (INDEPENDENT_AMBULATORY_CARE_PROVIDER_SITE_OTHER): Payer: Medicare Other | Admitting: *Deleted

## 2020-05-16 DIAGNOSIS — D508 Other iron deficiency anemias: Secondary | ICD-10-CM | POA: Diagnosis not present

## 2020-05-16 DIAGNOSIS — E538 Deficiency of other specified B group vitamins: Secondary | ICD-10-CM

## 2020-05-27 ENCOUNTER — Encounter: Payer: Self-pay | Admitting: Family Medicine

## 2020-05-27 ENCOUNTER — Ambulatory Visit (INDEPENDENT_AMBULATORY_CARE_PROVIDER_SITE_OTHER): Payer: Medicare Other | Admitting: Family Medicine

## 2020-05-27 ENCOUNTER — Other Ambulatory Visit: Payer: Self-pay

## 2020-05-27 VITALS — BP 138/63 | HR 76 | Ht 67.0 in | Wt 119.0 lb

## 2020-05-27 DIAGNOSIS — E034 Atrophy of thyroid (acquired): Secondary | ICD-10-CM | POA: Diagnosis not present

## 2020-05-27 DIAGNOSIS — E782 Mixed hyperlipidemia: Secondary | ICD-10-CM

## 2020-05-27 DIAGNOSIS — J449 Chronic obstructive pulmonary disease, unspecified: Secondary | ICD-10-CM

## 2020-05-27 DIAGNOSIS — E119 Type 2 diabetes mellitus without complications: Secondary | ICD-10-CM | POA: Diagnosis not present

## 2020-05-27 DIAGNOSIS — I10 Essential (primary) hypertension: Secondary | ICD-10-CM

## 2020-05-27 DIAGNOSIS — D649 Anemia, unspecified: Secondary | ICD-10-CM | POA: Insufficient documentation

## 2020-05-27 DIAGNOSIS — D509 Iron deficiency anemia, unspecified: Secondary | ICD-10-CM

## 2020-05-27 DIAGNOSIS — L918 Other hypertrophic disorders of the skin: Secondary | ICD-10-CM

## 2020-05-27 LAB — BAYER DCA HB A1C WAIVED: HB A1C (BAYER DCA - WAIVED): 5.6 % (ref <7.0)

## 2020-05-27 NOTE — Progress Notes (Signed)
BP 138/63   Pulse 76   Ht _0  (1.702 m)   Wt 119 lb (54 kg)   SpO2 93%   BMI 18.64 kg/m    Subjective:   [name], female    DOB: October 16, 1931, 87 y.o.   MRN: [id number]  [name] is a 87 y.o. female presenting on 05/27/2020 for Medical Management of Chronic Issues, Diabetes, and COPD   HPI Type 2 diabetes mellitus Patient comes in today for recheck of his diabetes. Patient has been currently taking no medication has been diet controlled, A1c is 5.6. Patient is not currently on an ACE inhibitor/ARB. Patient has not seen an ophthalmologist this year. Patient denies any issues with their feet except: That she sees podiatry for. The symptom started onset as an adult hypothyroidism and hyperlipidemia ARE RELATED TO DM   Hypothyroidism recheck Patient is coming in for thyroid recheck today as well. They deny any issues with hair changes or heat or cold problems or diarrhea or constipation. They deny any chest pain or palpitations. They are currently on levothyroxine 88 micrograms   Hyperlipidemia Patient is coming in for recheck of his hyperlipidemia. The patient is currently taking fish oil and Livalo. They deny any issues with myalgias or history of liver damage from it. They deny any focal numbness or weakness or chest pain.   COPD Patient is coming in for COPD recheck today.  He is currently on Pulmicort and albuterol.  He has a mild chronic cough but denies any major coughing spells or wheezing spells.  He has 1nighttime symptoms per week and 5daytime symptoms per week currently.  She is get a little more nasal congestion with allergies coming of this time a year in the spring.  Patient has an irritated skin tag on both sides of her neck that are very large and catch her Lif[employer] sometimes bleed and wants to get them removed, she will schedule back for that.  Anemia recheck Patient has been diagnosed with iron insufficiency anemia.  She has been in  with a hemoglobin of 10.9.  Is been consistent but will recheck today.  She denies any lightheadedness or dizziness or chest pain or palpitations or fatigue.  Relevant past medical, surgical, family and social history reviewed and updated as indicated. Interim medical history since our last visit reviewed. Allergies and medications reviewed and updated.  Review of Systems  Constitutional: Negative for chills and fever.  HENT: Positive for congestion. Negative for ear discharge and ear pain.   Eyes: Negative for redness and visual disturbance.  Respiratory: Negative for cough, chest tightness, shortness of breath and wheezing.   Cardiovascular: Negative for chest pain and leg swelling.  Genitourinary: Negative for difficulty urinating and dysuria.  Musculoskeletal: Negative for back pain and gait problem.  Skin: Negative for rash.  Neurological: Negative for light-headedness and headaches.  Psychiatric/Behavioral: Negative for agitation and behavioral problems.  All other systems reviewed and are negative.   Per HPI unless specifically indicated above   Allergies as of 05/27/2020      Reactions   Ace Inhibitors Cough   Codeine Other (See Comments)   Sleepy and crazy      Medication List       Accurate as of May 27, 2020 11:24 AM. If you have any questions, ask your nurse or doctor.        STOP taking these medications   amoxicillin 500 MG capsule Commonly known as: AMOXIL Stopped by:  Fransisca Kaufmann Renold Kozar, MD   predniSONE 20 MG tablet Commonly known as: DELTASONE Stopped by: Worthy Rancher, MD     TAKE these medications   acetaminophen 325 MG tablet Commonly known as: TYLENOL Take 650 mg by mouth every 6 (six) hours as needed (for pain).   albuterol 108 (90 Base) MCG/ACT inhaler Commonly known as: VENTOLIN HFA Inhale 1 puff into the lungs every 4 (four) hours as needed for wheezing.   albuterol (2.5 MG/3ML) 0.083% nebulizer solution Commonly known as:  PROVENTIL Take 3 mLs (2.5 mg total) by nebulization every 6 (six) hours as needed for wheezing or shortness of breath.   aspirin EC 81 MG tablet Take 81 mg by mouth daily.   budesonide 0.5 MG/2ML nebulizer solution Commonly known as: PULMICORT USE 1 VIAL IN NEBULIZER TWICE DAILY   cholecalciferol 25 MCG (1000 UNIT) tablet Commonly known as: VITAMIN D Take 2,000 Units by mouth every evening.   ferrous sulfate 325 (65 FE) MG tablet Take 1 tablet (325 mg total) by mouth every Monday, Wednesday, and Friday.   fluticasone 50 MCG/ACT nasal spray Commonly known as: FLONASE Place 1 spray into both nostrils 2 (two) times daily as needed for allergies or rhinitis.   Hugo Location manager Use daily for gait instability   hydrochlorothiazide 25 MG tablet Commonly known as: HYDRODIURIL Take 1 tablet (25 mg total) by mouth daily.   levothyroxine 88 MCG tablet Commonly known as: SYNTHROID Take 1 tablet (88 mcg total) by mouth daily.   Livalo 2 MG Tabs Generic drug: Pitavastatin Calcium Take 1 tablet (2 mg total) by mouth every evening.   meclizine 25 MG tablet Commonly known as: ANTIVERT Take 25 mg by mouth 2 (two) times daily.   omega-3 acid ethyl esters 1 g capsule Commonly known as: Lovaza Take 1 capsule (1 g total) by mouth daily.   OneTouch Delica Lancets Fine Misc Use  To check blood glucose once a day.  DX:  Type 2 DM E11.9   OneTouch Verio test strip Generic drug: glucose blood USE 1 STRIP TO CHECK GLUCOSE ONCE DAILY        Objective:   Ht _0  (1.702 m)   Wt 119 lb (54 kg)   BMI 18.64 kg/m   Wt Readings from Last 3 Encounters:  05/27/20 119 lb (54 kg)  02/26/20 123 lb (55.8 kg)  11/27/19 127 lb (57.6 kg)    Physical Exam Vitals and nursing note reviewed.  Constitutional:      General: She is not in acute distress.    Appearance: She is well-developed. She is not diaphoretic.  Eyes:     Conjunctiva/sclera: Conjunctivae normal.   Cardiovascular:     Rate and Rhythm: Normal rate and regular rhythm.     Heart sounds: Normal heart sounds. No murmur heard.   Pulmonary:     Effort: Pulmonary effort is normal. No respiratory distress.     Breath sounds: Normal breath sounds. No wheezing.  Musculoskeletal:        General: No tenderness. Normal range of motion.  Skin:    General: Skin is warm and dry.     Findings: Lesion (For large irritated skin tags right around her neckline) present. No rash.  Neurological:     Mental Status: She is alert and oriented to person, place, and time.     Coordination: Coordination normal.  Psychiatric:        Behavior: Behavior normal.  Assessment & Plan:   Problem List Items Addressed This Visit      Respiratory   COPD (chronic obstructive pulmonary disease) with chronic bronchitis ([place])     Endocrine   Hypothyroidism   Relevant Orders   CBC with Differential/Platelet   CMP14+EGFR   Lipid panel   TSH   Bayer DCA Hb A1c Waived (Completed)   Type 2 diabetes mellitus treated without insulin ([place]) - Primary   Relevant Orders   CBC with Differential/Platelet   CMP14+EGFR   Lipid panel   TSH   Bayer DCA Hb A1c Waived (Completed)     Other   Hyperlipidemia   Anemia, iron deficiency    Other Visit Diagnoses    Essential hypertension       Relevant Orders   CBC with Differential/Platelet   CMP14+EGFR   Lipid panel   TSH   Bayer DCA Hb A1c Waived (Completed)   Inflamed skin tag          Patient seems to be doing very well, sugars are under control and blood pressure looks good, she is losing weight encouraged diet. Follow up plan: Return in about 6 months (around 11/27/2020), or if symptoms worsen or fail to improve, for Thyroid and COPD and cholesterol, can also schedule back sooner for skin tag removal.  Counseling provided for all of the vaccine components Orders Placed This Encounter  Procedures  . CBC with Differential/Platelet  . CMP14+EGFR  .  Lipid panel  . TSH  . Bayer [facility] Hb A1c [place], MD [facility] 05/27/2020, 11:24 AM

## 2020-05-28 LAB — CBC WITH DIFFERENTIAL/PLATELET
Basophils Absolute: 0.1 10*3/uL (ref 0.0–0.2)
Basos: 1 %
EOS (ABSOLUTE): 0.2 10*3/uL (ref 0.0–0.4)
Eos: 2 %
Hematocrit: 34.4 % (ref 34.0–46.6)
Hemoglobin: 10.3 g/dL — ABNORMAL LOW (ref 11.1–15.9)
Immature Grans (Abs): 0 10*3/uL (ref 0.0–0.1)
Immature Granulocytes: 0 %
Lymphocytes Absolute: 1.5 10*3/uL (ref 0.7–3.1)
Lymphs: 16 %
MCH: 22.4 pg — ABNORMAL LOW (ref 26.6–33.0)
MCHC: 29.9 g/dL — ABNORMAL LOW (ref 31.5–35.7)
MCV: 75 fL — ABNORMAL LOW (ref 79–97)
Monocytes Absolute: 0.7 10*3/uL (ref 0.1–0.9)
Monocytes: 8 %
Neutrophils Absolute: 7 10*3/uL (ref 1.4–7.0)
Neutrophils: 73 %
Platelets: 377 10*3/uL (ref 150–450)
RBC: 4.59 x10E6/uL (ref 3.77–5.28)
RDW: 14.5 % (ref 11.7–15.4)
WBC: 9.6 10*3/uL (ref 3.4–10.8)

## 2020-05-28 LAB — CMP14+EGFR
ALT: 5 IU/L (ref 0–32)
AST: 11 IU/L (ref 0–40)
Albumin/Globulin Ratio: 1.2 (ref 1.2–2.2)
Albumin: 3.6 g/dL (ref 3.6–4.6)
Alkaline Phosphatase: 63 IU/L (ref 44–121)
BUN/Creatinine Ratio: 33 — ABNORMAL HIGH (ref 12–28)
BUN: 23 mg/dL (ref 8–27)
Bilirubin Total: 0.3 mg/dL (ref 0.0–1.2)
CO2: 33 mmol/L — ABNORMAL HIGH (ref 20–29)
Calcium: 9.7 mg/dL (ref 8.7–10.3)
Chloride: 94 mmol/L — ABNORMAL LOW (ref 96–106)
Creatinine, Ser: 0.7 mg/dL (ref 0.57–1.00)
Globulin, Total: 3.1 g/dL (ref 1.5–4.5)
Glucose: 105 mg/dL — ABNORMAL HIGH (ref 65–99)
Potassium: 3.5 mmol/L (ref 3.5–5.2)
Sodium: 141 mmol/L (ref 134–144)
Total Protein: 6.7 g/dL (ref 6.0–8.5)
eGFR: 84 mL/min/{1.73_m2} (ref >59)

## 2020-05-28 LAB — LIPID PANEL
Chol/HDL Ratio: 2.4 ratio (ref 0.0–4.4)
Cholesterol, Total: 129 mg/dL (ref 100–199)
HDL: 53 mg/dL (ref >39)
LDL Chol Calc (NIH): 59 mg/dL (ref 0–99)
Triglycerides: 91 mg/dL (ref 0–149)
VLDL Cholesterol Cal: 17 mg/dL (ref 5–40)

## 2020-05-28 LAB — TSH: TSH: 1.3 u[IU]/mL (ref 0.450–4.500)

## 2020-05-30 ENCOUNTER — Other Ambulatory Visit: Payer: Self-pay | Admitting: Family Medicine

## 2020-05-30 ENCOUNTER — Ambulatory Visit (INDEPENDENT_AMBULATORY_CARE_PROVIDER_SITE_OTHER): Payer: Medicare Other

## 2020-05-30 ENCOUNTER — Other Ambulatory Visit: Payer: Self-pay

## 2020-05-30 ENCOUNTER — Ambulatory Visit (INDEPENDENT_AMBULATORY_CARE_PROVIDER_SITE_OTHER): Payer: Medicare Other | Admitting: Nurse Practitioner

## 2020-05-30 ENCOUNTER — Encounter: Payer: Self-pay | Admitting: Nurse Practitioner

## 2020-05-30 VITALS — BP 142/66 | HR 105 | Temp 96.4°F | Ht 67.0 in | Wt 121.0 lb

## 2020-05-30 DIAGNOSIS — M79671 Pain in right foot: Secondary | ICD-10-CM | POA: Insufficient documentation

## 2020-05-30 MED ORDER — PREDNISONE 10 MG (21) PO TBPK: ORAL_TABLET | ORAL | 0 refills | Status: DC

## 2020-05-30 NOTE — Assessment & Plan Note (Signed)
Symptoms of right foot pain not well controlled in the last 3 days.  X-ray of the right foot completed results pending. Advised patient to continue Voltaren gel, ice application, rest foot and follow-up with worsening unresolved symptoms.  Follow-up with worsening or unresolved symptoms.

## 2020-05-30 NOTE — Patient Instructions (Addendum)
Foot Pain Many things can cause foot pain. Some common causes are:  An injury.  A sprain.  Arthritis.  Blisters.  Bunions. Follow these instructions at home: Managing pain, stiffness, and swelling If directed, put ice on the painful area:  Put ice in a plastic bag.  Place a towel between your skin and the bag.  Leave the ice on for 20 minutes, 2-3 times a day.   Activity  Do not stand or walk for long periods.  Return to your normal activities as told by your health care provider. Ask your health care provider what activities are safe for you.  Do stretches to relieve foot pain and stiffness as told by your health care provider.  Do not lift anything that is heavier than 10 lb (4.5 kg), or the limit that you are told, until your health care provider says that it is safe. Lifting a lot of weight can put added pressure on your feet. Lifestyle  Wear comfortable, supportive shoes that fit you well. Do not wear high heels.  Keep your feet clean and dry. General instructions  Take over-the-counter and prescription medicines only as told by your health care provider.  Rub your foot gently.  Pay attention to any changes in your symptoms.  Keep all follow-up visits as told by your health care provider. This is important. Contact a health care provider if:  Your pain does not get better after a few days of self-care.  Your pain gets worse.  You cannot stand on your foot. Get help right away if:  Your foot is numb or tingling.  Your foot or toes are swollen.  Your foot or toes turn white or blue.  You have warmth and redness along your foot. Summary  Common causes of foot pain are injury, sprain, arthritis, blisters or bunions.  Ice, medicines, and comfortable shoes may help foot pain.  Contact your health care provider if your pain does not get better after a few days of self-care. This information is not intended to replace advice given to you by your health  care provider. Make sure you discuss any questions you have with your health care provider. Document Revised: 12/19/2017 Document Reviewed: 12/19/2017 Elsevier Patient Education  2021 [employer].

## 2020-05-30 NOTE — Progress Notes (Signed)
Acute Office Visit  Subjective:    [name], female    DOB: 1932/03/15, 87 y.o.   MRN: [id number]  Chief Complaint  Patient presents with  . Foot Pain    Right x 3 days     Foot Pain This is a new problem. Episode onset: In the past 3 days. The problem occurs intermittently. The problem has been gradually worsening. Pertinent negatives include no fatigue, fever, joint swelling or numbness. The symptoms are aggravated by walking. Treatments tried: Voltaren gel. The treatment provided no relief.     Past Medical History:  Diagnosis Date  . Abdominal bloating   . anemia iron deficiency   . Arthritis   . Bowen's disease   . Cardiomyopathy   . Cataract   . CHF (congestive heart failure) ([place])   . COPD (chronic obstructive pulmonary disease) ([place])   . Diabetes mellitus without complication ([place])   . Diarrhea   . Diverticulosis   . Heme + stool   . Hemorrhoids   . Hiatal hernia   . Hyperlipemia   . Mitral valve regurgitation   . Neoplasm    Malignant, anal canal, squamous cell  . Nodular goiter   . Osteoporosis   . Rectal bleeding   . Rectocele   . Renal cyst   . Vitamin D deficiency     Past Surgical History:  Procedure Laterality Date  . ABDOMINAL AORTOGRAM W/LOWER EXTREMITY N/A 09/11/2016   Procedure: Abdominal Aortogram w/Lower Extremity;  Surgeon: Serafina Mitchell, MD;  Location: [facility];  Service: Cardiovascular;  Laterality: N/A;  . ABDOMINAL HYSTERECTOMY     vaginal secondary to fibroids  . COLON SURGERY     excision of cancer  . EYE SURGERY    . HEMORRHOID SURGERY    . NASAL SEPTUM SURGERY    . PERIPHERAL VASCULAR BALLOON ANGIOPLASTY Left 09/11/2016   Procedure: Peripheral Vascular Balloon Angioplasty;  Surgeon: Serafina Mitchell, MD;  Location: [facility];  Service: Cardiovascular;  Laterality: Left;  COMMON ILIAC  . PERIPHERAL VASCULAR CATHETERIZATION N/A 09/23/2015   Procedure: Abdominal Aortogram;  Surgeon: Elam Dutch, MD;  Location: [facility];  Service: Cardiovascular;  Laterality: N/A;  . PERIPHERAL VASCULAR CATHETERIZATION Bilateral 09/23/2015   Procedure: Lower Extremity Angiography;  Surgeon: Elam Dutch, MD;  Location: [facility];  Service: Cardiovascular;  Laterality: Bilateral;  . PERIPHERAL VASCULAR CATHETERIZATION Left 09/23/2015   Procedure: Peripheral Vascular Intervention;  Surgeon: Elam Dutch, MD;  Location: [facility];  Service: Cardiovascular;  Laterality: Left;  common iliac stent  . SQUAMOUS CELL CARCINOMA EXCISION  1985   anus resected    Family History  Problem Relation Age of Onset  . Diabetes Mother   . Alzheimer's disease Mother   . Diabetes Brother   . Alzheimer's disease Brother   . Colon cancer Neg Hx     Social History   Socioeconomic History  . Marital status: Married    Spouse name: Joni Fears  . Number of children: 1  . Years of education: Not on file  . Highest education level: Not on file  Occupational History  . Occupation: Retired  Tobacco Use  . Smoking status: Current Every Day Smoker    Packs/day: 0.50    Years: 57.00    Pack years: 28.50    Types: Cigarettes    Start date: 03/19/1953  . Smokeless tobacco: Never Used  . Tobacco comment:  Less than 1 pk per day  Vaping Use  . Vaping Use: Never used  Substance and Sexual Activity  . Alcohol use: No  . Drug use: No  . Sexual activity: Never  Other Topics Concern  . Not on file  Social History Narrative   Patient does not get regular exercise.   Social Determinants of Health   Financial Resource Strain: Not on file  Food Insecurity: Not on file  Transportation Needs: Not on file  Physical Activity: Not on file  Stress: Not on file  Social Connections: Not on file  Intimate Partner Violence: Not on file    Outpatient Medications Prior to Visit  Medication Sig Dispense Refill  . acetaminophen (TYLENOL) 325 MG tablet Take 650 mg by mouth every 6 (six)  hours as needed (for pain).     Marland Kitchen albuterol (PROVENTIL HFA;VENTOLIN HFA) 108 (90 Base) MCG/ACT inhaler Inhale 1 puff into the lungs every 4 (four) hours as needed for wheezing. 18 g 11  . albuterol (PROVENTIL) (2.5 MG/3ML) 0.083% nebulizer solution Take 3 mLs (2.5 mg total) by nebulization every 6 (six) hours as needed for wheezing or shortness of breath. 270 mL 3  . aspirin EC 81 MG tablet Take 81 mg by mouth daily.    . budesonide (PULMICORT) 0.5 MG/2ML nebulizer solution USE 1 VIAL IN NEBULIZER TWICE DAILY 120 mL 4  . Cholecalciferol (VITAMIN D3) 1000 UNITS tablet Take 2,000 Units by mouth every evening.    . ferrous sulfate 325 (65 FE) MG tablet Take 1 tablet (325 mg total) by mouth every Monday, Wednesday, and Friday. 15 tablet 11  . fluticasone (FLONASE) 50 MCG/ACT nasal spray Place 1 spray into both nostrils 2 (two) times daily as needed for allergies or rhinitis. 16 g 6  . hydrochlorothiazide (HYDRODIURIL) 25 MG tablet Take 1 tablet (25 mg total) by mouth daily. 90 tablet 3  . levothyroxine (SYNTHROID) 88 MCG tablet Take 1 tablet (88 mcg total) by mouth daily. 90 tablet 3  . meclizine (ANTIVERT) 25 MG tablet Take 25 mg by mouth 2 (two) times daily.    . Misc. Devices (HUGO ROLLING WALKER ELITE) MISC Use daily for gait instability 1 each 0  . omega-3 acid ethyl esters (LOVAZA) 1 g capsule Take 1 capsule (1 g total) by mouth daily. 90 capsule 3  . ONETOUCH DELICA LANCETS FINE MISC Use  To check blood glucose once a day.  DX:  Type 2 DM E11.9 100 each 1  . ONETOUCH VERIO test strip USE 1 STRIP TO CHECK GLUCOSE ONCE DAILY 100 each 10  . Pitavastatin Calcium (LIVALO) 2 MG TABS Take 1 tablet (2 mg total) by mouth every evening. 90 tablet 3   Facility-Administered Medications Prior to Visit  Medication Dose Route Frequency Provider Last Rate Last Admin  . cyanocobalamin ((VITAMIN B-12)) injection 1,000 mcg  1,000 mcg Intramuscular Q30 days Chipper Herb, MD   1,000 mcg at 05/16/20 1009     Allergies  Allergen Reactions  . Ace Inhibitors Cough  . Codeine Other (See Comments)    Sleepy and crazy    Review of Systems  Constitutional: Negative.  Negative for fatigue and fever.  HENT: Negative.   Musculoskeletal: Negative for joint swelling.       Right foot pain  Skin: Negative.  Negative for color change and wound.  Neurological: Negative for numbness.  All other systems reviewed and are negative.      Objective:    Physical Exam Vitals  reviewed.  Constitutional:      Appearance: Normal appearance.  HENT:     Head: Normocephalic.     Nose: Nose normal.  Eyes:     Conjunctiva/sclera: Conjunctivae normal.  Cardiovascular:     Rate and Rhythm: Normal rate and regular rhythm.     Pulses: Normal pulses.     Heart sounds: Normal heart sounds.  Pulmonary:     Effort: Pulmonary effort is normal.     Breath sounds: Normal breath sounds.  Abdominal:     General: Bowel sounds are normal.  Musculoskeletal:        General: Tenderness present.  Skin:    General: Skin is warm.     Findings: No erythema.  Neurological:     Mental Status: She is alert and oriented to person, place, and time.  Psychiatric:        Mood and Affect: Mood normal.        Behavior: Behavior normal.     BP (!) 142/66   Pulse (!) 105   Temp (!) 96.4 F (35.8 C) (Temporal)   Ht 5\' 7"  (1.702 m)   Wt 121 lb (54.9 kg)   SpO2 91%   BMI 18.95 kg/m  Wt Readings from Last 3 Encounters:  05/30/20 121 lb (54.9 kg)  05/27/20 119 lb (54 kg)  02/26/20 123 lb (55.8 kg)    There are no preventive care reminders to display for this patient.  There are no preventive care reminders to display for this patient.   Lab Results  Component Value Date   TSH 1.300 05/27/2020   Lab Results  Component Value Date   WBC 9.6 05/27/2020   HGB 10.3 (L) 05/27/2020   HCT 34.4 05/27/2020   MCV 75 (L) 05/27/2020   PLT 377 05/27/2020   Lab Results  Component Value Date   NA 141 05/27/2020   K  3.5 05/27/2020   CO2 33 (H) 05/27/2020   GLUCOSE 105 (H) 05/27/2020   BUN 23 05/27/2020   CREATININE 0.70 05/27/2020   BILITOT 0.3 05/27/2020   ALKPHOS 63 05/27/2020   AST 11 05/27/2020   ALT 5 05/27/2020   PROT 6.7 05/27/2020   ALBUMIN 3.6 05/27/2020   CALCIUM 9.7 05/27/2020   Lab Results  Component Value Date   CHOL 129 05/27/2020   Lab Results  Component Value Date   HDL 53 05/27/2020   Lab Results  Component Value Date   LDLCALC 59 05/27/2020   Lab Results  Component Value Date   TRIG 91 05/27/2020   Lab Results  Component Value Date   CHOLHDL 2.4 05/27/2020   Lab Results  Component Value Date   HGBA1C 5.6 05/27/2020       Assessment & Plan:   Problem List Items Addressed This Visit      Other   Right foot pain - Primary    Symptoms of right foot pain not well controlled in the last 3 days.  X-ray of the right foot completed results pending. Advised patient to continue Voltaren gel, ice application, rest foot and follow-up with worsening unresolved symptoms.  Follow-up with worsening or unresolved symptoms.      Relevant Medications   predniSONE (STERAPRED UNI-PAK 21 TAB) 10 MG (21) TBPK tablet   Other Relevant Orders   DG Foot Complete Right       Meds ordered this encounter  Medications  . predniSONE (STERAPRED UNI-PAK 21 TAB) 10 MG (21) TBPK tablet    Sig: 6  tablets day 1, 5 tablet day 2, 4 tablet day 3, 3 tablet day 4, 2 tablets day 5, 1 tablet day 6    Dispense:  1 each    Refill:  0    Order Specific Question:   Supervising Provider    Answer:   Janora Norlander [[id number]]     Ivy Lynn, NP

## 2020-05-31 ENCOUNTER — Telehealth: Payer: Self-pay

## 2020-05-31 NOTE — Telephone Encounter (Signed)
Spoke with patients daughter. Appt kept for 04/13 and the 4/8th appointments canceled.

## 2020-06-07 DIAGNOSIS — B351 Tinea unguium: Secondary | ICD-10-CM | POA: Diagnosis not present

## 2020-06-07 DIAGNOSIS — M79676 Pain in unspecified toe(s): Secondary | ICD-10-CM | POA: Diagnosis not present

## 2020-06-07 DIAGNOSIS — E1142 Type 2 diabetes mellitus with diabetic polyneuropathy: Secondary | ICD-10-CM | POA: Diagnosis not present

## 2020-06-07 DIAGNOSIS — L84 Corns and callosities: Secondary | ICD-10-CM | POA: Diagnosis not present

## 2020-06-10 ENCOUNTER — Other Ambulatory Visit: Payer: Self-pay | Admitting: Family Medicine

## 2020-06-10 DIAGNOSIS — J440 Chronic obstructive pulmonary disease with acute lower respiratory infection: Secondary | ICD-10-CM | POA: Diagnosis not present

## 2020-06-10 DIAGNOSIS — J209 Acute bronchitis, unspecified: Secondary | ICD-10-CM

## 2020-06-14 ENCOUNTER — Ambulatory Visit: Payer: Medicare Other

## 2020-06-15 ENCOUNTER — Ambulatory Visit (INDEPENDENT_AMBULATORY_CARE_PROVIDER_SITE_OTHER): Payer: Medicare Other

## 2020-06-15 ENCOUNTER — Other Ambulatory Visit: Payer: Self-pay

## 2020-06-15 DIAGNOSIS — E538 Deficiency of other specified B group vitamins: Secondary | ICD-10-CM

## 2020-06-15 DIAGNOSIS — D508 Other iron deficiency anemias: Secondary | ICD-10-CM

## 2020-06-17 DIAGNOSIS — H524 Presbyopia: Secondary | ICD-10-CM | POA: Diagnosis not present

## 2020-06-24 ENCOUNTER — Ambulatory Visit: Payer: Medicare Other | Admitting: Family Medicine

## 2020-06-29 ENCOUNTER — Ambulatory Visit: Payer: Medicare Other | Admitting: Family Medicine

## 2020-07-07 ENCOUNTER — Other Ambulatory Visit: Payer: Self-pay | Admitting: *Deleted

## 2020-07-07 MED ORDER — ONETOUCH VERIO VI STRP: ORAL_STRIP | 3 refills | Status: AC

## 2020-07-18 ENCOUNTER — Ambulatory Visit (INDEPENDENT_AMBULATORY_CARE_PROVIDER_SITE_OTHER): Payer: Medicare Other | Admitting: Family Medicine

## 2020-07-18 ENCOUNTER — Other Ambulatory Visit: Payer: Self-pay

## 2020-07-18 DIAGNOSIS — D508 Other iron deficiency anemias: Secondary | ICD-10-CM | POA: Diagnosis not present

## 2020-07-18 DIAGNOSIS — E538 Deficiency of other specified B group vitamins: Secondary | ICD-10-CM

## 2020-07-21 ENCOUNTER — Other Ambulatory Visit: Payer: Self-pay | Admitting: *Deleted

## 2020-07-21 DIAGNOSIS — J440 Chronic obstructive pulmonary disease with acute lower respiratory infection: Secondary | ICD-10-CM

## 2020-07-21 DIAGNOSIS — J209 Acute bronchitis, unspecified: Secondary | ICD-10-CM

## 2020-07-21 MED ORDER — ALBUTEROL SULFATE (2.5 MG/3ML) 0.083% IN NEBU: 2.5000 mg | INHALATION_SOLUTION | Freq: Four times a day (QID) | RESPIRATORY_TRACT | 3 refills | Status: DC | PRN

## 2020-07-21 MED ORDER — BUDESONIDE 0.5 MG/2ML IN SUSP: RESPIRATORY_TRACT | 4 refills | Status: AC

## 2020-07-21 NOTE — Telephone Encounter (Signed)
Transferring Rxs to [facility] needing Albuterol Solution & Budesonide

## 2020-07-22 ENCOUNTER — Telehealth: Payer: Self-pay

## 2020-07-22 MED ORDER — ALBUTEROL SULFATE (2.5 MG/3ML) 0.083% IN NEBU: 2.5000 mg | INHALATION_SOLUTION | Freq: Four times a day (QID) | RESPIRATORY_TRACT | 3 refills | Status: AC | PRN

## 2020-07-22 NOTE — Telephone Encounter (Signed)
Spoke with pharmacist at [facility], they need a new prescription with the diagnosis code on it for the albuterol,  Prescription sent to pharmacy, patient aware.

## 2020-07-26 DIAGNOSIS — J440 Chronic obstructive pulmonary disease with acute lower respiratory infection: Secondary | ICD-10-CM | POA: Diagnosis not present

## 2020-08-05 DIAGNOSIS — G44219 Episodic tension-type headache, not intractable: Secondary | ICD-10-CM | POA: Diagnosis not present

## 2020-08-05 DIAGNOSIS — J449 Chronic obstructive pulmonary disease, unspecified: Secondary | ICD-10-CM | POA: Diagnosis not present

## 2020-08-13 DIAGNOSIS — H40033 Anatomical narrow angle, bilateral: Secondary | ICD-10-CM | POA: Diagnosis not present

## 2020-08-13 DIAGNOSIS — G44219 Episodic tension-type headache, not intractable: Secondary | ICD-10-CM | POA: Diagnosis not present

## 2020-08-16 DIAGNOSIS — I70203 Unspecified atherosclerosis of native arteries of extremities, bilateral legs: Secondary | ICD-10-CM | POA: Diagnosis not present

## 2020-08-16 DIAGNOSIS — M79676 Pain in unspecified toe(s): Secondary | ICD-10-CM | POA: Diagnosis not present

## 2020-08-16 DIAGNOSIS — L84 Corns and callosities: Secondary | ICD-10-CM | POA: Diagnosis not present

## 2020-08-16 DIAGNOSIS — B351 Tinea unguium: Secondary | ICD-10-CM | POA: Diagnosis not present

## 2020-08-18 ENCOUNTER — Ambulatory Visit (INDEPENDENT_AMBULATORY_CARE_PROVIDER_SITE_OTHER): Payer: Medicare Other

## 2020-08-18 ENCOUNTER — Other Ambulatory Visit: Payer: Self-pay

## 2020-08-18 DIAGNOSIS — E538 Deficiency of other specified B group vitamins: Secondary | ICD-10-CM

## 2020-08-18 NOTE — Progress Notes (Signed)
Cyanocobalamin injection given to left deltoid.  Patient tolerated well. 

## 2020-08-29 ENCOUNTER — Other Ambulatory Visit: Payer: Self-pay

## 2020-08-29 ENCOUNTER — Ambulatory Visit (INDEPENDENT_AMBULATORY_CARE_PROVIDER_SITE_OTHER): Payer: Medicare Other

## 2020-08-29 ENCOUNTER — Encounter: Payer: Self-pay | Admitting: Family Medicine

## 2020-08-29 ENCOUNTER — Ambulatory Visit (INDEPENDENT_AMBULATORY_CARE_PROVIDER_SITE_OTHER): Payer: Medicare Other | Admitting: Family Medicine

## 2020-08-29 ENCOUNTER — Ambulatory Visit (HOSPITAL_COMMUNITY)
Admission: RE | Admit: 2020-08-29 | Discharge: 2020-08-29 | Disposition: A | Discharge status: Home / Self Care | Payer: Medicare Other | Source: Ambulatory Visit | Attending: Family Medicine | Admitting: Family Medicine

## 2020-08-29 VITALS — BP 111/50 | HR 76 | Ht 67.0 in | Wt 115.2 lb

## 2020-08-29 DIAGNOSIS — R634 Abnormal weight loss: Secondary | ICD-10-CM

## 2020-08-29 DIAGNOSIS — E782 Mixed hyperlipidemia: Secondary | ICD-10-CM

## 2020-08-29 DIAGNOSIS — D509 Iron deficiency anemia, unspecified: Secondary | ICD-10-CM | POA: Diagnosis not present

## 2020-08-29 DIAGNOSIS — J449 Chronic obstructive pulmonary disease, unspecified: Secondary | ICD-10-CM

## 2020-08-29 DIAGNOSIS — R918 Other nonspecific abnormal finding of lung field: Secondary | ICD-10-CM

## 2020-08-29 DIAGNOSIS — I2510 Atherosclerotic heart disease of native coronary artery without angina pectoris: Secondary | ICD-10-CM | POA: Diagnosis not present

## 2020-08-29 DIAGNOSIS — E119 Type 2 diabetes mellitus without complications: Secondary | ICD-10-CM

## 2020-08-29 DIAGNOSIS — E034 Atrophy of thyroid (acquired): Secondary | ICD-10-CM | POA: Diagnosis not present

## 2020-08-29 DIAGNOSIS — R911 Solitary pulmonary nodule: Secondary | ICD-10-CM | POA: Diagnosis not present

## 2020-08-29 DIAGNOSIS — J4489 Other specified chronic obstructive pulmonary disease: Secondary | ICD-10-CM

## 2020-08-29 DIAGNOSIS — I700 Atherosclerosis of aorta: Secondary | ICD-10-CM | POA: Diagnosis not present

## 2020-08-29 DIAGNOSIS — J929 Pleural plaque without asbestos: Secondary | ICD-10-CM | POA: Diagnosis not present

## 2020-08-29 DIAGNOSIS — J439 Emphysema, unspecified: Secondary | ICD-10-CM | POA: Diagnosis not present

## 2020-08-29 LAB — BAYER DCA HB A1C WAIVED: HB A1C (BAYER DCA - WAIVED): 5.6 % (ref <7.0)

## 2020-08-29 NOTE — Progress Notes (Signed)
BP (!) 111/50   Pulse 76   Ht '5\' 7"'  (1.702 m)   Wt 115 lb 4 oz (52.3 kg)   SpO2 92%   BMI 18.05 kg/m    Subjective:   [name], female    DOB: January 16, 1932, 87 y.o.   MRN: [id number]  [name] is a 87 y.o. female presenting on 08/29/2020 for Medical Management of Chronic Issues, Diabetes, and Hypothyroidism   HPI Hypothyroidism recheck Patient is coming in for thyroid recheck today as well. They deny any issues with hair changes or heat or cold problems or diarrhea or constipation. They deny any chest pain or palpitations. They are currently on levothyroxine 59mcrograms   Hyperlipidemia Patient is coming in for recheck of his hyperlipidemia. The patient is currently taking livalo. They deny any issues with myalgias or history of liver damage from it. They deny any focal numbness or weakness or chest pain.   COPD Patient is coming in for COPD recheck today.  He is currently on budesonide.  He has a mild chronic cough but denies any major coughing spells or wheezing spells.  He has 7nighttime symptoms per week and 7daytime symptoms per week currently.  She is having decreased appetite and gets short of breath easily and is not active and is not eating as much because of her breathing.  Type 2 diabetes mellitus Patient comes in today for recheck of his diabetes. Patient has been currently taking no medication currently has been diet controlled, A1c 5.6 today. Patient is not currently on an ACE inhibitor/ARB hypotension and dizziness. Patient has not seen an ophthalmologist this year. Patient denies any issues with their feet. The symptom started onset as an adult hyperlipidemia and hypothyroidism ARE RELATED TO DM   Anemia recheck Patient is coming in today for anemia recheck.  She does get some lightheadedness and dizziness still, her blood pressure is on the lower side.  She denies any bruising or bleeding that she has noticed her blood in her stool.  We  will recheck her anemia panel today.  Patient still gets some dizziness lightheadedness and her blood pressure is on the lower side.  She is taking a fluid pill, will stop it for the time being.  Relevant past medical, surgical, family and social history reviewed and updated as indicated. Interim medical history since our last visit reviewed. Allergies and medications reviewed and updated.  Review of Systems  Constitutional:  Positive for appetite change and unexpected weight change. Negative for chills and fever.  Eyes:  Negative for redness and visual disturbance.  Respiratory:  Positive for cough, shortness of breath and wheezing. Negative for chest tightness.   Cardiovascular:  Negative for chest pain and leg swelling.  Genitourinary:  Negative for difficulty urinating and dysuria.  Musculoskeletal:  Negative for back pain and gait problem.  Skin:  Negative for rash.  Neurological:  Positive for dizziness and light-headedness. Negative for weakness, numbness and headaches.  Psychiatric/Behavioral:  Negative for agitation and behavioral problems.   All other systems reviewed and are negative.  Per HPI unless specifically indicated above   Allergies as of 08/29/2020       Reactions   Ace Inhibitors Cough   Codeine Other (See Comments)   Sleepy and crazy        Medication List        Accurate as of August 29, 2020 12:00 PM. If you have any questions, ask your nurse or doctor.  STOP taking these medications    hydrochlorothiazide 25 MG tablet Commonly known as: HYDRODIURIL Stopped by: Fransisca Kaufmann Briunna Leicht, MD   predniSONE 10 MG (21) Tbpk tablet Commonly known as: STERAPRED UNI-PAK 21 TAB Stopped by: Fransisca Kaufmann Marius Betts, MD       TAKE these medications    acetaminophen 325 MG tablet Commonly known as: TYLENOL Take 650 mg by mouth every 6 (six) hours as needed (for pain).   albuterol 108 (90 Base) MCG/ACT inhaler Commonly known as: VENTOLIN HFA Inhale 1  puff into the lungs every 4 (four) hours as needed for wheezing.   albuterol (2.5 MG/3ML) 0.083% nebulizer solution Commonly known as: PROVENTIL Take 3 mLs (2.5 mg total) by nebulization every 6 (six) hours as needed for wheezing or shortness of breath. Dx J44.9   aspirin EC 81 MG tablet Take 81 mg by mouth daily.   budesonide 0.5 MG/2ML nebulizer solution Commonly known as: PULMICORT USE 1 VIAL IN NEBULIZER TWICE DAILY DX: J44.0 , J20.9   cholecalciferol 25 MCG (1000 UNIT) tablet Commonly known as: VITAMIN D Take 2,000 Units by mouth every evening.   ferrous sulfate 325 (65 FE) MG tablet Take 1 tablet (325 mg total) by mouth every Monday, Wednesday, and Friday.   fluticasone 50 MCG/ACT nasal spray Commonly known as: FLONASE Place 1 spray into both nostrils 2 (two) times daily as needed for allergies or rhinitis.   Hugo Location manager Use daily for gait instability   levothyroxine 88 MCG tablet Commonly known as: SYNTHROID Take 1 tablet (88 mcg total) by mouth daily.   Livalo 2 MG Tabs Generic drug: Pitavastatin Calcium Take 1 tablet (2 mg total) by mouth every evening.   meclizine 25 MG tablet Commonly known as: ANTIVERT Take 25 mg by mouth 2 (two) times daily.   omega-3 acid ethyl esters 1 g capsule Commonly known as: Lovaza Take 1 capsule (1 g total) by mouth daily.   OneTouch Delica Lancets Fine Misc Use  To check blood glucose once a day.  DX:  Type 2 DM E11.9   OneTouch Verio test strip Generic drug: glucose blood Check glucose once daily Dx E11.9         Objective:   BP (!) 111/50   Pulse 76   Ht '5\' 7"'  (1.702 m)   Wt 115 lb 4 oz (52.3 kg)   SpO2 92%   BMI 18.05 kg/m   Wt Readings from Last 3 Encounters:  08/29/20 115 lb 4 oz (52.3 kg)  05/30/20 121 lb (54.9 kg)  05/27/20 119 lb (54 kg)    Physical Exam Vitals and nursing note reviewed.  Constitutional:      General: She is not in acute distress.    Appearance: She is  well-developed. She is not diaphoretic.  Eyes:     Conjunctiva/sclera: Conjunctivae normal.  Cardiovascular:     Rate and Rhythm: Normal rate and regular rhythm.     Heart sounds: Normal heart sounds. No murmur heard. Pulmonary:     Effort: Pulmonary effort is normal. No respiratory distress.     Breath sounds: Decreased air movement (Moving air but definitely diminished throughout, not able to take strong breaths) present. No wheezing, rhonchi or rales.  Musculoskeletal:        General: No tenderness. Normal range of motion.  Skin:    General: Skin is warm and dry.     Findings: No rash.  Neurological:     Mental Status: She is alert and  oriented to person, place, and time.     Coordination: Coordination normal.  Psychiatric:        Behavior: Behavior normal.    Chest x-ray: Pending, follow-up with Radiology read Assessment & Plan:   Problem List Items Addressed This Visit       Respiratory   COPD (chronic obstructive pulmonary disease) with chronic bronchitis ([place])   Relevant Orders   DG Chest 2 View     Endocrine   Hypothyroidism   Relevant Orders   CBC with Differential/Platelet   BMP8+EGFR   TSH   Bayer DCA Hb A1c Waived   Type 2 diabetes mellitus treated without insulin ([place]) - Primary   Relevant Orders   CBC with Differential/Platelet   BMP8+EGFR   TSH   Bayer DCA Hb A1c Waived     Other   Hyperlipidemia   Anemia, iron deficiency   Relevant Orders   CBC with Differential/Platelet   BMP8+EGFR   TSH   Bayer DCA Hb A1c Waived   Other Visit Diagnoses     Weight loss       Relevant Orders   DG Chest 2 View       Will stop hydrochlorothiazide, recommended that she get a chest x-ray today.  Recommended increase nutrition and take supplements such as vitamins to help boost her nutrition and her immune system.  Patient continues to smoke and has no desire to quit. Follow up plan: No follow-ups on file.  Counseling provided for all of the vaccine  components Orders Placed This Encounter  Procedures   DG Chest 2 View   CBC with Differential/Platelet   BMP8+EGFR   TSH   Bayer DCA Hb A1c Waived    Caryl Pina, MD [facility] 08/29/2020, 12:00 PM

## 2020-08-29 NOTE — Addendum Note (Signed)
Addended by: Caryl Pina on: 08/29/2020 01:04 PM   Modules accepted: Orders

## 2020-08-31 ENCOUNTER — Other Ambulatory Visit: Payer: Self-pay

## 2020-08-31 ENCOUNTER — Ambulatory Visit (INDEPENDENT_AMBULATORY_CARE_PROVIDER_SITE_OTHER): Payer: Medicare Other | Admitting: Family Medicine

## 2020-08-31 VITALS — Ht 67.0 in | Wt 115.0 lb

## 2020-08-31 DIAGNOSIS — C3411 Malignant neoplasm of upper lobe, right bronchus or lung: Secondary | ICD-10-CM

## 2020-08-31 DIAGNOSIS — J449 Chronic obstructive pulmonary disease, unspecified: Secondary | ICD-10-CM

## 2020-08-31 DIAGNOSIS — D649 Anemia, unspecified: Secondary | ICD-10-CM | POA: Diagnosis not present

## 2020-08-31 DIAGNOSIS — D509 Iron deficiency anemia, unspecified: Secondary | ICD-10-CM | POA: Diagnosis not present

## 2020-08-31 LAB — CBC WITH DIFFERENTIAL/PLATELET
Basophils Absolute: 0.1 10*3/uL (ref 0.0–0.2)
Basophils Absolute: 0.1 10*3/uL (ref 0.0–0.2)
Basos: 1 %
Basos: 1 %
EOS (ABSOLUTE): 0.2 10*3/uL (ref 0.0–0.4)
EOS (ABSOLUTE): 0.1 10*3/uL (ref 0.0–0.4)
Eos: 1 %
Eos: 1 %
Hematocrit: 26.7 % — ABNORMAL LOW (ref 34.0–46.6)
Hematocrit: 26 % — ABNORMAL LOW (ref 34.0–46.6)
Hemoglobin: 8 g/dL — CL (ref 11.1–15.9)
Hemoglobin: 7.8 g/dL — CL (ref 11.1–15.9)
Immature Grans (Abs): 0.1 10*3/uL (ref 0.0–0.1)
Immature Grans (Abs): 0 10*3/uL (ref 0.0–0.1)
Immature Granulocytes: 1 %
Immature Granulocytes: 0 %
Lymphocytes Absolute: 1.2 10*3/uL (ref 0.7–3.1)
Lymphocytes Absolute: 1.2 10*3/uL (ref 0.7–3.1)
Lymphs: 9 %
Lymphs: 11 %
MCH: 20.4 pg — ABNORMAL LOW (ref 26.6–33.0)
MCH: 20.8 pg — ABNORMAL LOW (ref 26.6–33.0)
MCHC: 30 g/dL — ABNORMAL LOW (ref 31.5–35.7)
MCHC: 30 g/dL — ABNORMAL LOW (ref 31.5–35.7)
MCV: 68 fL — ABNORMAL LOW (ref 79–97)
MCV: 69 fL — ABNORMAL LOW (ref 79–97)
Monocytes Absolute: 0.8 10*3/uL (ref 0.1–0.9)
Monocytes Absolute: 0.7 10*3/uL (ref 0.1–0.9)
Monocytes: 6 %
Monocytes: 7 %
Neutrophils Absolute: 10.5 10*3/uL — ABNORMAL HIGH (ref 1.4–7.0)
Neutrophils Absolute: 8.3 10*3/uL — ABNORMAL HIGH (ref 1.4–7.0)
Neutrophils: 82 %
Neutrophils: 80 %
Platelets: 461 10*3/uL — ABNORMAL HIGH (ref 150–450)
Platelets: 435 10*3/uL (ref 150–450)
RBC: 3.92 x10E6/uL (ref 3.77–5.28)
RBC: 3.75 x10E6/uL — ABNORMAL LOW (ref 3.77–5.28)
RDW: 15.5 % — ABNORMAL HIGH (ref 11.7–15.4)
RDW: 15.4 % (ref 11.7–15.4)
WBC: 12.7 10*3/uL — ABNORMAL HIGH (ref 3.4–10.8)
WBC: 10.4 10*3/uL (ref 3.4–10.8)

## 2020-08-31 LAB — BMP8+EGFR
BUN/Creatinine Ratio: 44 — ABNORMAL HIGH (ref 12–28)
BUN: 27 mg/dL (ref 8–27)
CO2: 29 mmol/L (ref 20–29)
Calcium: 9.8 mg/dL (ref 8.7–10.3)
Chloride: 99 mmol/L (ref 96–106)
Creatinine, Ser: 0.61 mg/dL (ref 0.57–1.00)
Glucose: 94 mg/dL (ref 65–99)
Potassium: 4.4 mmol/L (ref 3.5–5.2)
Sodium: 141 mmol/L (ref 134–144)
eGFR: 86 mL/min/{1.73_m2} (ref >59)

## 2020-08-31 LAB — HEMOGLOBIN, FINGERSTICK: Hemoglobin: 7.3 g/dL — ABNORMAL LOW (ref 11.1–15.9)

## 2020-08-31 LAB — TSH: TSH: 1.05 u[IU]/mL (ref 0.450–4.500)

## 2020-08-31 NOTE — Progress Notes (Signed)
Ht 5\' 7"  (1.702 m)   Wt 115 lb (52.2 kg)   BMI 18.01 kg/m    Subjective:   [name], female    DOB: 07-23-1931, 87 y.o.   MRN: [id number]  [name] is a 87 y.o. female presenting on 08/31/2020 for Discuss CT results   HPI Anemia Likely due cancer, patient is fatigued and tired and low energy. Patient's hb dipped from 8.0 to 7.3. She has lung cancer and is currently taking iron. Will see about transfusion.  Lung cancer Patient has 9 cm lung tumor with multiple other smaller spots.  Patient declines chemo or radiation treatments or surgery and is hoping to go on to hospice in the future   Relevant past medical, surgical, family and social history reviewed and updated as indicated. Interim medical history since our last visit reviewed. Allergies and medications reviewed and updated.  Review of Systems  Constitutional:  Negative for chills and fever.  Eyes:  Negative for visual disturbance.  Respiratory:  Positive for shortness of breath. Negative for chest tightness and wheezing.   Cardiovascular:  Negative for chest pain and leg swelling.  Musculoskeletal:  Negative for back pain and gait problem.  Skin:  Negative for rash.  Neurological:  Negative for light-headedness and headaches.  Psychiatric/Behavioral:  Negative for agitation and behavioral problems.   All other systems reviewed and are negative.  Per HPI unless specifically indicated above   Allergies as of 08/31/2020       Reactions   Ace Inhibitors Cough   Codeine Other (See Comments)   Sleepy and crazy        Medication List        Accurate as of August 31, 2020  1:43 PM. If you have any questions, ask your nurse or doctor.          acetaminophen 325 MG tablet Commonly known as: TYLENOL Take 650 mg by mouth every 6 (six) hours as needed (for pain).   albuterol 108 (90 Base) MCG/ACT inhaler Commonly known as: VENTOLIN HFA Inhale 1 puff into the lungs every 4 (four)  hours as needed for wheezing.   albuterol (2.5 MG/3ML) 0.083% nebulizer solution Commonly known as: PROVENTIL Take 3 mLs (2.5 mg total) by nebulization every 6 (six) hours as needed for wheezing or shortness of breath. Dx J44.9   aspirin EC 81 MG tablet Take 81 mg by mouth daily.   budesonide 0.5 MG/2ML nebulizer solution Commonly known as: PULMICORT USE 1 VIAL IN NEBULIZER TWICE DAILY DX: J44.0 , J20.9   cholecalciferol 25 MCG (1000 UNIT) tablet Commonly known as: VITAMIN D Take 2,000 Units by mouth every evening.   ferrous sulfate 325 (65 FE) MG tablet Take 1 tablet (325 mg total) by mouth every Monday, Wednesday, and Friday.   fluticasone 50 MCG/ACT nasal spray Commonly known as: FLONASE Place 1 spray into both nostrils 2 (two) times daily as needed for allergies or rhinitis.   [employer] Location manager Use daily for gait instability   levothyroxine 88 MCG tablet Commonly known as: SYNTHROID Take 1 tablet (88 mcg total) by mouth daily.   Livalo 2 MG Tabs Generic drug: Pitavastatin Calcium Take 1 tablet (2 mg total) by mouth every evening.   meclizine 25 MG tablet Commonly known as: ANTIVERT Take 25 mg by mouth 2 (two) times daily.   omega-3 acid ethyl esters 1 g capsule Commonly known as: Lovaza Take 1 capsule (1 g total) by mouth  daily.   OneTouch Delica Lancets Fine Misc Use  To check blood glucose once a day.  DX:  Type 2 DM E11.9   OneTouch Verio test strip Generic drug: glucose blood Check glucose once daily Dx E11.9         Objective:   Ht 5\' 7"  (1.702 m)   Wt 115 lb (52.2 kg)   BMI 18.01 kg/m   Wt Readings from Last 3 Encounters:  08/31/20 115 lb (52.2 kg)  08/29/20 115 lb 4 oz (52.3 kg)  05/30/20 121 lb (54.9 kg)    Physical Exam Vitals and nursing note reviewed.  Constitutional:      General: She is not in acute distress.    Appearance: She is well-developed. She is not diaphoretic.  Skin:    General: Skin is warm and dry.      Findings: No rash.  Neurological:     Mental Status: She is alert and oriented to person, place, and time.     Coordination: Coordination normal.  Psychiatric:        Behavior: Behavior normal.    Results for orders placed or performed in visit on 08/31/20  Hemoglobin, fingerstick  Result Value Ref Range   Hemoglobin 7.3 (L) 11.1 - 15.9 g/dL    Assessment & Plan:   Problem List Items Addressed This Visit       Respiratory   COPD (chronic obstructive pulmonary disease) with chronic bronchitis (HCC)     Other   Anemia, iron deficiency   Relevant Orders   Hemoglobin, fingerstick   CBC with Differential/Platelet   Other Visit Diagnoses     Malignant neoplasm of upper lobe of right lung ([place])    -  Primary   Relevant Orders   Ambulatory referral to Hospice   Anemia, unspecified type       Relevant Orders   Hemoglobin, fingerstick (Completed)   CBC with Differential/Platelet   Hemoglobin, fingerstick   CBC with Differential/Platelet      Recheck hb in 2 days, hb 7.3. doesn't want treatment, consider hospice in future.   Follow up plan: Return if symptoms worsen or fail to improve.  Counseling provided for all of the vaccine components Orders Placed This Encounter  Procedures   Hemoglobin, fingerstick   CBC with Differential/Platelet   Hemoglobin, fingerstick   CBC with Differential/Platelet   Ambulatory referral to [place], MD [facility] 08/31/2020, 1:43 PM

## 2020-09-01 ENCOUNTER — Telehealth: Payer: Self-pay | Admitting: Family Medicine

## 2020-09-01 MED ORDER — FUROSEMIDE 20 MG PO TABS: 20.0000 mg | ORAL_TABLET | Freq: Every day | ORAL | 3 refills | Status: DC

## 2020-09-01 NOTE — Telephone Encounter (Signed)
Patient was taking off hydrochlorothazide and her ankles are swelling. Please call Rhonda.

## 2020-09-01 NOTE — Telephone Encounter (Signed)
Patients daughter aware

## 2020-09-01 NOTE — Telephone Encounter (Signed)
I sent furosemide for the patient instead of the hydrochlorothiazide, it should not lower her blood pressure is much and should help with the swelling more.  Only use it on the days that she needs it.

## 2020-09-05 ENCOUNTER — Other Ambulatory Visit: Payer: Medicare Other

## 2020-09-05 ENCOUNTER — Other Ambulatory Visit: Payer: Self-pay

## 2020-09-05 DIAGNOSIS — D649 Anemia, unspecified: Secondary | ICD-10-CM

## 2020-09-05 DIAGNOSIS — D509 Iron deficiency anemia, unspecified: Secondary | ICD-10-CM

## 2020-09-05 LAB — HEMOGLOBIN, FINGERSTICK: Hemoglobin: 9.5 g/dL — ABNORMAL LOW (ref 11.1–15.9)

## 2020-09-06 ENCOUNTER — Telehealth: Payer: Self-pay | Admitting: Family Medicine

## 2020-09-06 ENCOUNTER — Other Ambulatory Visit: Payer: Self-pay

## 2020-09-06 DIAGNOSIS — D649 Anemia, unspecified: Secondary | ICD-10-CM

## 2020-09-06 LAB — CBC WITH DIFFERENTIAL/PLATELET
Basophils Absolute: 0.1 10*3/uL (ref 0.0–0.2)
Basos: 1 %
EOS (ABSOLUTE): 0.1 10*3/uL (ref 0.0–0.4)
Eos: 1 %
Hematocrit: 25.6 % — ABNORMAL LOW (ref 34.0–46.6)
Hemoglobin: 7.7 g/dL — CL (ref 11.1–15.9)
Immature Grans (Abs): 0 10*3/uL (ref 0.0–0.1)
Immature Granulocytes: 0 %
Lymphocytes Absolute: 1.2 10*3/uL (ref 0.7–3.1)
Lymphs: 11 %
MCH: 20.4 pg — ABNORMAL LOW (ref 26.6–33.0)
MCHC: 30.1 g/dL — ABNORMAL LOW (ref 31.5–35.7)
MCV: 68 fL — ABNORMAL LOW (ref 79–97)
Monocytes Absolute: 0.7 10*3/uL (ref 0.1–0.9)
Monocytes: 6 %
Neutrophils Absolute: 9.3 10*3/uL — ABNORMAL HIGH (ref 1.4–7.0)
Neutrophils: 81 %
Platelets: 435 10*3/uL (ref 150–450)
RBC: 3.78 x10E6/uL (ref 3.77–5.28)
RDW: 15.3 % (ref 11.7–15.4)
WBC: 11.5 10*3/uL — ABNORMAL HIGH (ref 3.4–10.8)

## 2020-09-06 NOTE — Telephone Encounter (Signed)
Please let the patient know that she can take an extra furosemide as needed and what is that she go ahead and take 1 extra daily over the next 3 days, monitor for lightheadedness or dizziness, see if that helps with the swelling and edema

## 2020-09-06 NOTE — Telephone Encounter (Signed)
Daughter made aware and wrote down instructions. She will contact the office back if BP becomes low or pt has lightheadedness or dizziness.

## 2020-09-06 NOTE — Telephone Encounter (Signed)
Katie Wilkins with Hospice is calling to check on medication issues with patient: Patient is on Furosemide 20 mg daily. Patient has edema in both feet B/P is 127/68 Heart rate is 74 Can the furosemide be increased to help with the swelling/edema  *Also, patient has been admitted to Hospice services (at home) today 09/06/20  Please call.

## 2020-09-09 ENCOUNTER — Other Ambulatory Visit: Payer: Self-pay | Admitting: Family Medicine

## 2020-09-09 MED ORDER — HYDROCHLOROTHIAZIDE 25 MG PO TABS: 25.0000 mg | ORAL_TABLET | Freq: Every day | ORAL | 3 refills | Status: AC

## 2020-09-09 NOTE — Progress Notes (Signed)
Spoke to Ms Katie Alamo, Ms Katie Wilkins' daughter.  Given the increased urinary frequency at night, I would worry about her sustaining a fall.  We discussed that her lower extremity edema may not improve given her low hemoglobin.  I am sure that there is some leakage due to decreased oncotic pressures in the setting of this low hemoglobin.  However, I do not see a contraindication to swapping her back to hydrochlorothiazide, particularly if it reduces her risk of falling at nighttime due to urinary frequency.  I did asked that she monitor her blood pressure closely and monitor for any concerning symptoms or signs of orthostasis.  She voiced good understanding.  I will certainly reach out to her PCP as an FYI.  I encouraged her to contact us if there is anything else that we can do to support her during this very difficult time with her mother's new diagnosis of what appears to be a metastatic lung cancer  Medications Discontinued During This Encounter  Medication Reason   furosemide (LASIX) 20 MG tablet    Meds ordered this encounter  Medications   hydrochlorothiazide (HYDRODIURIL) 25 MG tablet    Sig: Take 1 tablet (25 mg total) by mouth daily.    Dispense:  90 tablet    Refill:  3     Koi Yarbro M. Lajuana Ripple, [facility]

## 2020-09-16 ENCOUNTER — Telehealth: Payer: Self-pay | Admitting: Family Medicine

## 2020-09-16 NOTE — Telephone Encounter (Signed)
Okay thanks for the information 

## 2020-09-21 ENCOUNTER — Other Ambulatory Visit: Payer: Medicare Other

## 2020-09-21 ENCOUNTER — Ambulatory Visit: Payer: Medicare Other

## 2020-09-21 ENCOUNTER — Other Ambulatory Visit: Payer: Self-pay

## 2020-09-21 ENCOUNTER — Other Ambulatory Visit: Payer: Self-pay | Admitting: Family Medicine

## 2020-09-21 DIAGNOSIS — D649 Anemia, unspecified: Secondary | ICD-10-CM

## 2020-09-21 LAB — HEMOGLOBIN, FINGERSTICK: Hemoglobin: 7.6 g/dL — ABNORMAL LOW (ref 11.1–15.9)

## 2020-09-21 NOTE — Progress Notes (Signed)
Placed order for fingerstick hemoglobin for the patient

## 2020-09-22 LAB — CBC WITH DIFFERENTIAL/PLATELET
Basophils Absolute: 0.1 10*3/uL (ref 0.0–0.2)
Basos: 1 %
EOS (ABSOLUTE): 0.2 10*3/uL (ref 0.0–0.4)
Eos: 1 %
Hematocrit: 27.3 % — ABNORMAL LOW (ref 34.0–46.6)
Hemoglobin: 7.7 g/dL — CL (ref 11.1–15.9)
Immature Grans (Abs): 0 10*3/uL (ref 0.0–0.1)
Immature Granulocytes: 0 %
Lymphocytes Absolute: 1.2 10*3/uL (ref 0.7–3.1)
Lymphs: 10 %
MCH: 19.3 pg — ABNORMAL LOW (ref 26.6–33.0)
MCHC: 28.2 g/dL — ABNORMAL LOW (ref 31.5–35.7)
MCV: 68 fL — ABNORMAL LOW (ref 79–97)
Monocytes Absolute: 0.8 10*3/uL (ref 0.1–0.9)
Monocytes: 7 %
Neutrophils Absolute: 10.2 10*3/uL — ABNORMAL HIGH (ref 1.4–7.0)
Neutrophils: 81 %
Platelets: 459 10*3/uL — ABNORMAL HIGH (ref 150–450)
RBC: 3.99 x10E6/uL (ref 3.77–5.28)
RDW: 15.9 % — ABNORMAL HIGH (ref 11.7–15.4)
WBC: 12.5 10*3/uL — ABNORMAL HIGH (ref 3.4–10.8)

## 2020-09-24 ENCOUNTER — Encounter: Payer: Self-pay | Admitting: Family Medicine

## 2020-10-07 ENCOUNTER — Telehealth: Payer: Self-pay

## 2020-10-07 NOTE — Telephone Encounter (Signed)
Nurse with Hospice called to inform Dr. Warrick Parisian that the pt fell last yesterday evening. Pt is doing ok. C/O tailbone pain, continues to have leg weakness, and having a hard time walking. Pt requested that nurse place catheter so she did not have to keep getting up to go to bathroom. Pt is not wanting to be checked out. Nurse states that pt does not have any bruising. Just some soreness. Pt has been ambulating. Nurse just wanted to make Katie Wilkins aware.

## 2020-10-17 ENCOUNTER — Telehealth: Payer: Self-pay | Admitting: Family Medicine

## 2020-10-18 ENCOUNTER — Telehealth: Payer: Self-pay | Admitting: Family Medicine

## 2020-10-18 NOTE — Telephone Encounter (Signed)
Please call [place] when [place] finishes filling out paperwork and it has been uploaded.

## 2020-11-17 DEATH — deceased

## 2020-12-02 ENCOUNTER — Ambulatory Visit: Payer: Medicare Other | Admitting: Family Medicine
